# Patient Record
Sex: Female | Born: 1960 | Race: White | Hispanic: No | Marital: Married | Smoking: Never smoker
Health system: Southern US, Community
[De-identification: ages and names within clinical notes are randomized; demographics above are authoritative.]

## PROBLEM LIST (undated history)

## (undated) DIAGNOSIS — C801 Malignant (primary) neoplasm, unspecified: Secondary | ICD-10-CM

## (undated) DIAGNOSIS — R011 Cardiac murmur, unspecified: Secondary | ICD-10-CM

## (undated) DIAGNOSIS — E785 Hyperlipidemia, unspecified: Secondary | ICD-10-CM

## (undated) DIAGNOSIS — H269 Unspecified cataract: Secondary | ICD-10-CM

## (undated) DIAGNOSIS — F419 Anxiety disorder, unspecified: Secondary | ICD-10-CM

## (undated) DIAGNOSIS — M25473 Effusion, unspecified ankle: Secondary | ICD-10-CM

## (undated) DIAGNOSIS — I4891 Unspecified atrial fibrillation: Secondary | ICD-10-CM

## (undated) DIAGNOSIS — G4730 Sleep apnea, unspecified: Secondary | ICD-10-CM

## (undated) DIAGNOSIS — I10 Essential (primary) hypertension: Secondary | ICD-10-CM

## (undated) HISTORY — DX: Sleep apnea, unspecified: G47.30

## (undated) HISTORY — DX: Essential (primary) hypertension: I10

## (undated) HISTORY — DX: Unspecified cataract: H26.9

## (undated) HISTORY — DX: Hyperlipidemia, unspecified: E78.5

## (undated) HISTORY — DX: Unspecified atrial fibrillation: I48.91

## (undated) HISTORY — DX: Anxiety disorder, unspecified: F41.9

---

## 2006-05-11 ENCOUNTER — Ambulatory Visit: Payer: Self-pay | Admitting: Family Medicine

## 2006-07-06 ENCOUNTER — Ambulatory Visit: Payer: Self-pay | Admitting: Family Medicine

## 2006-07-27 ENCOUNTER — Encounter: Admission: RE | Admit: 2006-07-27 | Discharge: 2006-10-25 | Payer: Self-pay | Admitting: Family Medicine

## 2006-08-03 ENCOUNTER — Ambulatory Visit: Payer: Self-pay | Admitting: Family Medicine

## 2006-08-03 LAB — CONVERTED CEMR LAB
CO2: 29 meq/L (ref 19–32)
Creatinine, Ser: 0.7 mg/dL (ref 0.4–1.2)
Glucose, Bld: 128 mg/dL — ABNORMAL HIGH (ref 70–99)
Potassium: 3.8 meq/L (ref 3.5–5.1)
Sodium: 137 meq/L (ref 135–145)

## 2006-11-06 DIAGNOSIS — F32A Depression, unspecified: Secondary | ICD-10-CM | POA: Insufficient documentation

## 2006-11-06 DIAGNOSIS — I10 Essential (primary) hypertension: Secondary | ICD-10-CM | POA: Insufficient documentation

## 2006-11-06 DIAGNOSIS — F419 Anxiety disorder, unspecified: Secondary | ICD-10-CM

## 2006-12-07 ENCOUNTER — Ambulatory Visit: Payer: Self-pay | Admitting: Family Medicine

## 2006-12-07 LAB — CONVERTED CEMR LAB
BUN: 15 mg/dL (ref 6–23)
Calcium: 9.3 mg/dL (ref 8.4–10.5)
Chloride: 104 meq/L (ref 96–112)
Cholesterol: 190 mg/dL (ref 0–200)
GFR calc Af Amer: 116 mL/min
GFR calc non Af Amer: 96 mL/min
LDL Cholesterol: 121 mg/dL — ABNORMAL HIGH (ref 0–99)
Total CHOL/HDL Ratio: 4.6
VLDL: 28 mg/dL (ref 0–40)

## 2007-02-27 ENCOUNTER — Ambulatory Visit: Payer: Self-pay | Admitting: Family Medicine

## 2007-02-27 ENCOUNTER — Encounter (INDEPENDENT_AMBULATORY_CARE_PROVIDER_SITE_OTHER): Payer: Self-pay | Admitting: Family Medicine

## 2007-02-27 ENCOUNTER — Encounter: Payer: Self-pay | Admitting: Family Medicine

## 2007-06-03 ENCOUNTER — Ambulatory Visit: Payer: Self-pay | Admitting: Family Medicine

## 2007-06-04 ENCOUNTER — Telehealth (INDEPENDENT_AMBULATORY_CARE_PROVIDER_SITE_OTHER): Payer: Self-pay | Admitting: *Deleted

## 2007-06-04 LAB — CONVERTED CEMR LAB
BUN: 10 mg/dL (ref 6–23)
Calcium: 9.4 mg/dL (ref 8.4–10.5)
Chloride: 104 meq/L (ref 96–112)
Creatinine, Ser: 0.7 mg/dL (ref 0.4–1.2)
GFR calc non Af Amer: 96 mL/min

## 2007-07-31 ENCOUNTER — Telehealth (INDEPENDENT_AMBULATORY_CARE_PROVIDER_SITE_OTHER): Payer: Self-pay | Admitting: *Deleted

## 2007-11-07 ENCOUNTER — Ambulatory Visit: Payer: Self-pay | Admitting: Family Medicine

## 2007-11-07 DIAGNOSIS — N3941 Urge incontinence: Secondary | ICD-10-CM

## 2007-11-07 LAB — CONVERTED CEMR LAB
CO2: 28 meq/L (ref 19–32)
Calcium: 9.5 mg/dL (ref 8.4–10.5)
Cholesterol: 192 mg/dL (ref 0–200)
GFR calc Af Amer: 115 mL/min
GFR calc non Af Amer: 95 mL/min
Glucose, Bld: 91 mg/dL (ref 70–99)
HDL: 40.3 mg/dL (ref >39.0)
LDL Cholesterol: 136 mg/dL — ABNORMAL HIGH (ref 0–99)
Sodium: 138 meq/L (ref 135–145)
Total CHOL/HDL Ratio: 4.8

## 2007-11-08 ENCOUNTER — Encounter (INDEPENDENT_AMBULATORY_CARE_PROVIDER_SITE_OTHER): Payer: Self-pay | Admitting: *Deleted

## 2008-01-27 ENCOUNTER — Encounter: Admission: RE | Admit: 2008-01-27 | Discharge: 2008-01-27 | Payer: Self-pay | Admitting: Family Medicine

## 2008-01-29 ENCOUNTER — Telehealth (INDEPENDENT_AMBULATORY_CARE_PROVIDER_SITE_OTHER): Payer: Self-pay | Admitting: *Deleted

## 2009-11-29 ENCOUNTER — Encounter (INDEPENDENT_AMBULATORY_CARE_PROVIDER_SITE_OTHER): Payer: Self-pay | Admitting: *Deleted

## 2009-12-03 ENCOUNTER — Encounter (INDEPENDENT_AMBULATORY_CARE_PROVIDER_SITE_OTHER): Payer: Self-pay | Admitting: *Deleted

## 2009-12-16 ENCOUNTER — Ambulatory Visit: Payer: Self-pay | Admitting: Family Medicine

## 2009-12-16 DIAGNOSIS — E1169 Type 2 diabetes mellitus with other specified complication: Secondary | ICD-10-CM | POA: Insufficient documentation

## 2009-12-16 DIAGNOSIS — E785 Hyperlipidemia, unspecified: Secondary | ICD-10-CM | POA: Insufficient documentation

## 2009-12-17 LAB — CONVERTED CEMR LAB
AST: 17 units/L (ref 0–37)
Alkaline Phosphatase: 62 units/L (ref 39–117)
Bilirubin, Direct: 0 mg/dL (ref 0.0–0.3)
CO2: 30 meq/L (ref 19–32)
Calcium: 9.1 mg/dL (ref 8.4–10.5)
GFR calc non Af Amer: 94.33 mL/min (ref >60)
Glucose, Bld: 86 mg/dL (ref 70–99)
HDL: 56.1 mg/dL (ref >39.00)
Potassium: 4.1 meq/L (ref 3.5–5.1)
Sodium: 139 meq/L (ref 135–145)
Total CHOL/HDL Ratio: 3
VLDL: 21.4 mg/dL (ref 0.0–40.0)

## 2009-12-28 ENCOUNTER — Encounter: Payer: Self-pay | Admitting: Family Medicine

## 2010-01-21 ENCOUNTER — Telehealth (INDEPENDENT_AMBULATORY_CARE_PROVIDER_SITE_OTHER): Payer: Self-pay | Admitting: *Deleted

## 2010-01-24 ENCOUNTER — Telehealth: Payer: Self-pay | Admitting: Family Medicine

## 2010-03-28 ENCOUNTER — Telehealth: Payer: Self-pay | Admitting: Family Medicine

## 2010-03-31 ENCOUNTER — Telehealth: Payer: Self-pay | Admitting: Family Medicine

## 2010-05-25 ENCOUNTER — Telehealth: Payer: Self-pay | Admitting: Family Medicine

## 2010-07-04 ENCOUNTER — Ambulatory Visit: Payer: Self-pay | Admitting: Family Medicine

## 2010-07-05 LAB — CONVERTED CEMR LAB
ALT: 22 units/L (ref 0–35)
AST: 23 units/L (ref 0–37)
Albumin: 4.2 g/dL (ref 3.5–5.2)
Alkaline Phosphatase: 53 units/L (ref 39–117)
Basophils Absolute: 0.1 10*3/uL (ref 0.0–0.1)
Basophils Relative: 1.6 % (ref 0.0–3.0)
Calcium: 9.1 mg/dL (ref 8.4–10.5)
Eosinophils Relative: 2.4 % (ref 0.0–5.0)
GFR calc non Af Amer: 108.27 mL/min (ref >60)
HCT: 36.7 % (ref 36.0–46.0)
HDL: 48.3 mg/dL (ref >39.00)
Hemoglobin: 12.4 g/dL (ref 12.0–15.0)
LDL Cholesterol: 88 mg/dL (ref 0–99)
Lymphocytes Relative: 27.5 % (ref 12.0–46.0)
Monocytes Relative: 7 % (ref 3.0–12.0)
Neutro Abs: 4.7 10*3/uL (ref 1.4–7.7)
Potassium: 3.8 meq/L (ref 3.5–5.1)
RBC: 3.85 M/uL — ABNORMAL LOW (ref 3.87–5.11)
RDW: 13.4 % (ref 11.5–14.6)
Sodium: 139 meq/L (ref 135–145)
Total CHOL/HDL Ratio: 3
VLDL: 15 mg/dL (ref 0.0–40.0)
WBC: 7.7 10*3/uL (ref 4.5–10.5)

## 2010-07-20 ENCOUNTER — Encounter: Payer: Self-pay | Admitting: Family Medicine

## 2010-07-20 ENCOUNTER — Encounter
Admission: RE | Admit: 2010-07-20 | Discharge: 2010-09-14 | Payer: Self-pay | Source: Home / Self Care | Attending: Family Medicine | Admitting: Family Medicine

## 2010-08-22 ENCOUNTER — Telehealth: Payer: Self-pay | Admitting: Family Medicine

## 2010-09-26 ENCOUNTER — Telehealth: Payer: Self-pay | Admitting: Family Medicine

## 2010-11-02 ENCOUNTER — Telehealth: Payer: Self-pay | Admitting: Family Medicine

## 2010-11-06 ENCOUNTER — Encounter: Payer: Self-pay | Admitting: Family Medicine

## 2010-11-15 ENCOUNTER — Encounter: Admit: 2010-11-15 | Payer: Self-pay | Admitting: Family Medicine

## 2010-11-15 NOTE — Progress Notes (Signed)
Summary: refill   Phone Note Refill Request Message from:  Fax from Pharmacy on March 28, 2010 9:51 AM  Refills Requested: Medication #1:  DETROL LA 2 MG CP24 take one capsule daily [facility] [address] [facility] [place] fax - fax [phone] -- ph [phone]   Method Requested: Fax to Local Pharmacy Next Appointment Scheduled: none Initial call taken by: Okey Regal Spring,  March 28, 2010 9:52 AM  Follow-up for Phone Call        According to our records this has not been filled since 2009, okay to fill. Army Fossa CMA  March 28, 2010 10:20 AM   Additional Follow-up for Phone Call Additional follow up Details #1::        make sure pt is taking it---then ok to refill for 1 year Additional Follow-up by: Loreen Freud DO,  March 28, 2010 12:42 PM    Additional Follow-up for Phone Call Additional follow up Details #2::    left message for pt to call back. Army Fossa CMA  March 28, 2010 1:23 PM   Additional Follow-up for Phone Call Additional follow up Details #3:: Details for Additional Follow-up Action Taken: Pt states that she is not taking this medicaiton. Army Fossa CMA  March 28, 2010 1:30 PM

## 2010-11-15 NOTE — Progress Notes (Signed)
Summary: refill  Phone Note Refill Request Message from:  Fax from Pharmacy on May 25, 2010 2:50 PM  Refills Requested: Medication #1:  LORAZEPAM 0.5 MG TABS Take one tablet tid [employer]- fax [phone]  Initial call taken by: Okey Regal Spring,  May 25, 2010 2:50 PM  Follow-up for Phone Call        last filled 12-16-09, last Ov 04-15-10 #90.............Marland KitchenFelecia Deloach CMA  May 26, 2010 10:38 AM   Additional Follow-up for Phone Call Additional follow up Details #1::        ok for #90, no refills Additional Follow-up by: Neena Rhymes MD,  May 26, 2010 10:45 AM    Prescriptions: LORAZEPAM 0.5 MG TABS (LORAZEPAM) Take one tablet tid  #90 x 0   Entered by:   Jeremy Johann CMA   Authorized by:   Neena Rhymes MD   Signed by:   Jeremy Johann CMA on 05/26/2010   Method used:   Printed then faxed to ...       [facility] [id number]* (retail)       [address]       [place], [place]  [address]       Ph: [phone]       Fax: [phone]   RxID:   [id number]

## 2010-11-15 NOTE — Letter (Signed)
Summary: New Patient Letter  [facility]  [address] [place], [place] [address]   Phone: [phone]  Fax: [phone]       12/03/2009 MRN: [id number]  [facility] [address] [place], [place]  [address]  Dear Ms. Pam Phillips,   Welcome to [facility] and thank you for choosing [place] as your Primary Care Providers. Enclosed you will find information about our practice that we hope you find helpful. We have also enclosed forms to be filled out prior to your visit. This will provide [place] with the necessary information and facilitate your being seen in a timely manner. If you have any questions, please call us at: [phone] and we will be happy to assist you. We look forward to seeing you at your scheduled appointment time.  Appointment Thursday, December 16, 2009 at 10:00am  with Dr. Loreen Freud   Sincerely,  Primary Health Care Team  Please arrive 15 minutes early for your first appointment and bring your insurance card. Co-pay is required at the time of your visit.  *****Please call the office if you are not able to keep this appointment. There is a charge of $50.00 if any appointment is not cancelled or rescheduled within 24 hours*****

## 2010-11-15 NOTE — Assessment & Plan Note (Signed)
Summary: 6 mth fu/kdc   Vital Signs:  Patient profile:   50 year old female Weight:      362.0 pounds BMI:     54.44 Temp:     98.7 degrees F oral Pulse rate:   72 / minute Pulse rhythm:   regular BP sitting:   130 / 80  (right arm) Cuff size:   large  Vitals Entered By: Almeta Monas CMA Duncan Dull) (July 04, 2010 9:40 AM) CC: 6 mo f/u, wants a referral to a nutritionist   History of Present Illness: Pt here for f/u and labs.   Hyperlipidemia follow-up      This is a 50 year old woman who presents for Hyperlipidemia follow-up.  The patient denies muscle aches, GI upset, abdominal pain, flushing, itching, constipation, diarrhea, and fatigue.  The patient denies the following symptoms: chest pain/pressure, exercise intolerance, dypsnea, palpitations, syncope.    Compliance with medications (by patient report) has been near 100%.  Dietary compliance has been poor.  The patient reports exercising 3-4X per week.    Hypertension follow-up      The patient also presents for Hypertension follow-up.  The patient denies lightheadedness, urinary frequency, headaches, edema, impotence, rash, and fatigue.  Associated symptoms include leg edema.  The patient denies the following associated symptoms: chest pain, chest pressure, exercise intolerance, dyspnea, palpitations, syncope, and pedal edema.  Compliance with medications (by patient report) has been near 100%.  The patient reports that dietary compliance has been poor.  The patient reports exercising 3-4X per week.  Adjunctive measures currently used by the patient include salt restriction.    Preventive Screening-Counseling & Management  Alcohol-Tobacco     Smoking Status: never      Drug Use:  no.    Current Medications (verified): 1)  Diovan Hct 320-25 Mg Tabs (Valsartan-Hydrochlorothiazide) .... Take 1 By Mouth Daily. 2)  Lorazepam 0.5 Mg Tabs (Lorazepam) .... Take One Tablet Tid 3)  Detrol La 2 Mg Cp24 (Tolterodine Tartrate) ....  Take One Capsule Daily 4)  Lovastatin 40 Mg Tabs (Lovastatin) .... Take 1 By Mouth At Bedtime. 5)  Aspirin 81 Mg Chew (Aspirin) .Marland Kitchen.. 1 By Mouth Qd  Allergies (verified): 1)  ! Penicillin 2)  ! Codeine  Past History:  Past medical, surgical, family and social histories (including risk factors) reviewed for relevance to current acute and chronic problems.  Past Medical History: Reviewed history from 12/16/2009 and no changes required. Anxiety Hypertension Hyperlipidemia  Family History: Reviewed history and no changes required. Family History of CAD Female 1st degree relative 51yo Family History Hypertension  Social History: Reviewed history and no changes required. Occupation: Neurosurgeon for [employer] Married Never Smoked Alcohol use-no Drug use-no Occupation:  employed Drug Use:  no  Review of Systems      See HPI  Physical Exam  General:  Well-developed,well-nourished,in no acute distress; alert,appropriate and cooperative throughout examination Neck:  No deformities, masses, or tenderness noted. Lungs:  Normal respiratory effort, chest expands symmetrically. Lungs are clear to auscultation, no crackles or wheezes. Heart:  normal rate and Grade 2  /6 systolic ejection murmur.   Extremities:  1+ left pedal edema, left pretibial edema, 1+ right pedal edema, and right pretibial edema.   Psych:  Oriented X3 and normally interactive.     Impression & Recommendations:  Problem # 1:  MORBID OBESITY (ICD-278.01)  Orders: Nutrition Referral (Nutrition) Venipuncture ([address]) TLB-Lipid Panel (80061-LIPID) TLB-BMP (Basic Metabolic Panel-BMET) (80048-METABOL) TLB-CBC Platelet - w/Differential (85025-CBCD)  TLB-Hepatic/Liver Function Pnl (80076-HEPATIC) TLB-TSH (Thyroid Stimulating Hormone) (84443-TSH) Specimen Handling ([address])  Ht: 68.5 (12/16/2009)   Wt: 362.0 (07/04/2010)   BMI: 54.44 (07/04/2010)  Problem # 2:  HYPERLIPIDEMIA (ICD-272.4)  Her updated  medication list for this problem includes:    Lovastatin 40 Mg Tabs (Lovastatin) .Marland Kitchen... Take 1 by mouth at bedtime.  Orders: Nutrition Referral (Nutrition) Venipuncture ([address]) TLB-Lipid Panel (80061-LIPID) TLB-BMP (Basic Metabolic Panel-BMET) (80048-METABOL) TLB-CBC Platelet - w/Differential (85025-CBCD) TLB-Hepatic/Liver Function Pnl (80076-HEPATIC) TLB-TSH (Thyroid Stimulating Hormone) (84443-TSH) Specimen Handling ([address])  Labs Reviewed: SGOT: 17 (12/16/2009)   SGPT: 21 (12/16/2009)   HDL:56.10 (12/16/2009), 40.3 (11/07/2007)  LDL:72 (12/16/2009), 136 (91/47/8295)  Chol:149 (12/16/2009), 192 (11/07/2007)  Trig:107.0 (12/16/2009), 79 (11/07/2007)  Problem # 3:  HYPERLIPIDEMIA (ICD-272.4)  Her updated medication list for this problem includes:    Lovastatin 40 Mg Tabs (Lovastatin) .Marland Kitchen... Take 1 by mouth at bedtime.  Orders: Nutrition Referral (Nutrition) Venipuncture ([address]) TLB-Lipid Panel (80061-LIPID) TLB-BMP (Basic Metabolic Panel-BMET) (80048-METABOL) TLB-CBC Platelet - w/Differential (85025-CBCD) TLB-Hepatic/Liver Function Pnl (80076-HEPATIC) TLB-TSH (Thyroid Stimulating Hormone) (84443-TSH) Specimen Handling ([address])  Labs Reviewed: SGOT: 17 (12/16/2009)   SGPT: 21 (12/16/2009)   HDL:56.10 (12/16/2009), 40.3 (11/07/2007)  LDL:72 (12/16/2009), 136 (46/96/2952)  Chol:149 (12/16/2009), 192 (11/07/2007)  Trig:107.0 (12/16/2009), 79 (11/07/2007)  Complete Medication List: 1)  Diovan Hct 320-25 Mg Tabs (Valsartan-hydrochlorothiazide) .... Take 1 by mouth daily. 2)  Lorazepam 0.5 Mg Tabs (Lorazepam) .... Take one tablet tid 3)  Detrol La 2 Mg Cp24 (Tolterodine tartrate) .... Take one capsule daily 4)  Lovastatin 40 Mg Tabs (Lovastatin) .... Take 1 by mouth at bedtime. 5)  Aspirin 81 Mg Chew (Aspirin) .Marland Kitchen.. 1 by mouth qd  Patient Instructions: 1)  Please schedule a follow-up appointment in 6 months .  Prescriptions: LORAZEPAM 0.5 MG TABS (LORAZEPAM) Take one tablet tid  #90  x 0   Entered and Authorized by:   Loreen Freud DO   Signed by:   Loreen Freud DO on 07/04/2010   Method used:   Print then Give to Patient   RxID:   [id number] LOVASTATIN 40 MG TABS (LOVASTATIN) take 1 by mouth at bedtime.  #90 x 3   Entered and Authorized by:   Loreen Freud DO   Signed by:   Loreen Freud DO on 07/04/2010   Method used:   Electronically to        [facility]* (retail)       [address]       [place], [place]  [address]       Ph: [phone]       Fax: [phone]   RxID:   [id number] DIOVAN HCT 320-25 MG TABS (VALSARTAN-HYDROCHLOROTHIAZIDE) take 1 by mouth daily.  #90 x 3   Entered and Authorized by:   Loreen Freud DO   Signed by:   Loreen Freud DO on 07/04/2010   Method used:   Electronically to        [facility]* (retail)       [address]       [place], [place]  [address]       Ph: [phone]       Fax: [phone]   RxID:   [id number]

## 2010-11-15 NOTE — Letter (Signed)
Summary: Records Dated 04-14-06 thru 04-30-09/[facility]  Records Dated 04-14-06 thru 04-30-09/[facility]   Imported By: Lanelle Bal 01/26/2010 11:12:05  _____________________________________________________________________  External Attachment:    Type:   Image     Comment:   External Document

## 2010-11-15 NOTE — Consult Note (Signed)
Summary: [facility]  [facility]   Imported By: Lanelle Bal 07/28/2010 14:22:38  _____________________________________________________________________  External Attachment:    Type:   Image     Comment:   External Document

## 2010-11-15 NOTE — Progress Notes (Signed)
Summary: refill  Phone Note Refill Request Message from:  Fax from Pharmacy on January 24, 2010 9:25 AM  Refills Requested: Medication #1:  LORAZEPAM 0.5 MG TABS Take one tablet tid [facility] [address] fax [phone]   Method Requested: Fax to Local Pharmacy Next Appointment Scheduled: 06/27/2010 Initial call taken by: Barb Merino,  January 24, 2010 9:26 AM  Follow-up for Phone Call        last filled, last ov- 12/16/09. Army Fossa CMA  January 24, 2010 9:59 AM   Additional Follow-up for Phone Call Additional follow up Details #1::        ok to refill Additional Follow-up by: Loreen Freud DO,  January 24, 2010 10:31 AM    Prescriptions: LORAZEPAM 0.5 MG TABS (LORAZEPAM) Take one tablet tid  #90 x 0   Entered by:   Army Fossa CMA   Authorized by:   Loreen Freud DO   Signed by:   Army Fossa CMA on 01/24/2010   Method used:   Printed then faxed to ...       [facility] [id number] (retail)       [address] [place], [place]  [address]       Ph: [phone] or [phone]       Fax: [phone]   RxID:   [id number]

## 2010-11-15 NOTE — Progress Notes (Signed)
Summary: refill  Phone Note Refill Request Message from:  Fax from Pharmacy on August 22, 2010 1:28 PM  Refills Requested: Medication #1:  LORAZEPAM 0.5 MG TABS Take one tablet tid [facility] - fax [phone]  Initial call taken by: Okey Regal Spring,  August 22, 2010 1:28 PM  Follow-up for Phone Call        Last seen and filled 07/04/10.Marland Kitchen Please advise Follow-up by: Almeta Monas CMA Duncan Dull),  August 22, 2010 2:27 PM  Additional Follow-up for Phone Call Additional follow up Details #1::        refill x1 Additional Follow-up by: Loreen Freud DO,  August 22, 2010 2:38 PM    Prescriptions: LORAZEPAM 0.5 MG TABS (LORAZEPAM) Take one tablet tid  #90 x 0   Entered by:   Almeta Monas CMA (AAMA)   Authorized by:   Loreen Freud DO   Signed by:   Almeta Monas CMA (AAMA) on 08/22/2010   Method used:   Printed then faxed to ...       [facility] [id number]* (retail)       [address]       [place], [place]  [address]       Ph: [phone]       Fax: [phone]   RxID:   [id number]  Faxed to [facility] @ [phone].... Almeta Monas CMA Duncan Dull)  August 22, 2010 3:07 PM

## 2010-11-15 NOTE — Progress Notes (Signed)
Summary: Refill Request  Phone Note Refill Request Message from:  Pharmacy on [facility] [address] Fax #: [phone]  Refills Requested: Medication #1:  LOVASTATIN 40 MG TABS take 1 by mouth at bedtime..   Dosage confirmed as above?Dosage Confirmed   Supply Requested: 1 month   Last Refilled: 12/21/2009 Next Appointment Scheduled: 9.12.11 Initial call taken by: Harold Barban,  January 21, 2010 8:47 AM    Prescriptions: LOVASTATIN 40 MG TABS (LOVASTATIN) take 1 by mouth at bedtime.  #30 x 5   Entered by:   Army Fossa CMA   Authorized by:   Loreen Freud DO   Signed by:   Army Fossa CMA on 01/21/2010   Method used:   Printed then faxed to ...       [id number]* (retail)       [address]       [place], [place]  [address]       Ph: [phone]       Fax: [phone]   RxID:   [id number]

## 2010-11-15 NOTE — Letter (Signed)
Summary: New Patient Letter  [facility]  [address] [place], [place] [address]   Phone: [phone]  Fax: [phone]       11/29/2009 MRN: [id number]  [facility] [address] [place], [place]  [address]  Dear Ms. Pam Phillips,   Welcome to [facility] and thank you for choosing [place] as your Primary Care Providers. Enclosed you will find information about our practice that we hope you find helpful. We have also enclosed forms to be filled out prior to your visit. This will provide [place] with the necessary information and facilitate your being seen in a timely manner. If you have any questions, please call us at:  [phone]        and we will be happy to assist you. We look forward to seeing you at your scheduled appointment time.  Appointment   MARCH [phone] @ 10:00AM            with Dr.  Laury Axon               Sincerely,  Primary Health Care Team  Please arrive 15 minutes early for your first appointment and bring your insurance card. Co-pay is required at the time of your visit.  *****Please call the office if you are not able to keep this appointment. There is a charge of $50.00 if any appointment is not cancelled or rescheduled within 24 hours.

## 2010-11-15 NOTE — Progress Notes (Signed)
Summary: refill  Phone Note Refill Request Call back at Work Phone [phone] Call back at [phone]   Refills Requested: Medication #1:  DETROL LA 2 MG CP24 take one capsule daily patient called re refill- there was some confusion at the pharmacy - patient & mother in law live together - they received a call at the home but spoke to mother in law & SHE doesnt take this med - please refill [facility] [address] [facility] [place]      Method Requested: Fax to Local Pharmacy Initial call taken by: Okey Regal Spring,  March 31, 2010 8:47 AM Caller: Patient Summary of Call: patient has been taking this med - she live  Follow-up for Phone Call        ok to refill x1 Follow-up by: Loreen Freud DO,  March 31, 2010 8:55 AM    Prescriptions: DETROL LA 2 MG CP24 (TOLTERODINE TARTRATE) take one capsule daily  #30 x 11   Entered by:   Army Fossa CMA   Authorized by:   Loreen Freud DO   Signed by:   Army Fossa CMA on 03/31/2010   Method used:   Faxed to ...       [id number] (retail)       [address]       [place], [place]  [address]       Ph: [phone]       Fax: [phone]   RxID:   [id number] DETROL LA 2 MG CP24 (TOLTERODINE TARTRATE) take one capsule daily  #30 x 11   Entered and Authorized by:   Loreen Freud DO   Signed by:   Loreen Freud DO on 03/31/2010   Method used:   Electronically to        [id number] (retail)       [address]       [place], [place]  [address]       Ph: [phone]       Fax: [phone]   RxID:   [id number]  [facility]. Army Fossa CMA  March 31, 2010 9:02 AM

## 2010-11-15 NOTE — Assessment & Plan Note (Signed)
Summary: new pt/bcbs/ns/kdc   Vital Signs:  Patient profile:   50 year old female Height:      68.5 inches Weight:      366 pounds BMI:     55.04 Temp:     98.4 degrees F oral Pulse rate:   80 / minute Pulse rhythm:   regular BP sitting:   126 / 80  (left arm) Cuff size:   large  Vitals Entered By: Army Fossa CMA (December 16, 2009 10:12 AM) CC: To establish, refills on meds, bloodwork (does not need CPX)   History of Present Illness:  Hypertension follow-up      This is a 50 year old woman who presents for Hypertension follow-up.  The patient denies lightheadedness, urinary frequency, headaches, edema, impotence, rash, and fatigue.  The patient denies the following associated symptoms: chest pain, chest pressure, exercise intolerance, dyspnea, palpitations, syncope, leg edema, and pedal edema.  Compliance with medications (by patient report) has been near 100%.  The patient reports that dietary compliance has been fair.  The patient reports exercising occasionally.  Adjunctive measures currently used by the patient include salt restriction.    Hyperlipidemia follow-up      The patient also presents for Hyperlipidemia follow-up.  The patient denies muscle aches, GI upset, abdominal pain, flushing, itching, constipation, diarrhea, and fatigue.  The patient denies the following symptoms: chest pain/pressure, exercise intolerance, dypsnea, palpitations, syncope, and pedal edema.  Compliance with medications (by patient report) has been near 100%.  Dietary compliance has been good.  The patient reports exercising occasionally.  Adjunctive measures currently used by the patient include fiber, ASA, fish oil supplements, and weight reduction.    Preventive Screening-Counseling & Management  Alcohol-Tobacco     Alcohol drinks/day: <1     Alcohol type: wine     Smoking Status: never  Caffeine-Diet-Exercise     Caffeine use/day: 4     Diet Comments: pt in diet and exercise program  Does Patient Exercise: yes      Sexual History:  currently monogamous.    Current Medications (verified): 1)  Diovan Hct 320-25 Mg Tabs (Valsartan-Hydrochlorothiazide) .... Take 1 By Mouth Daily. 2)  Lorazepam 0.5 Mg Tabs (Lorazepam) .... Take One Tablet Tid 3)  Detrol La 2 Mg Cp24 (Tolterodine Tartrate) .... Take One Capsule Daily 4)  Lovastatin 40 Mg Tabs (Lovastatin) .... Take 1 By Mouth At Bedtime.  Allergies: 1)  ! Penicillin 2)  ! Codeine  Past History:  Past medical, surgical, family and social histories (including risk factors) reviewed for relevance to current acute and chronic problems.  Past Medical History: Anxiety Hypertension Hyperlipidemia  Family History: Reviewed history and no changes required.  Social History: Reviewed history and no changes required. Smoking Status:  never Caffeine use/day:  4 Does Patient Exercise:  yes Sexual History:  currently monogamous  Review of Systems      See HPI  Physical Exam  General:  Well-developed,well-nourished,in no acute distress; alert,appropriate and cooperative throughout examination Neck:  No deformities, masses, or tenderness noted. Lungs:  Normal respiratory effort, chest expands symmetrically. Lungs are clear to auscultation, no crackles or wheezes. Heart:  normal rate and Grade  2 /6 systolic ejection murmur.   Extremities:  trace left pedal edema and trace right pedal edema.   Psych:  Cognition and judgment appear intact. Alert and cooperative with normal attention span and concentration. No apparent delusions, illusions, hallucinations   Impression & Recommendations:  Problem # 1:  HYPERLIPIDEMIA (ICD-272.4)  Her updated medication list for this problem includes:    Lovastatin 40 Mg Tabs (Lovastatin) .Marland Kitchen... Take 1 by mouth at bedtime.  Orders: Venipuncture ([address]) TLB-Lipid Panel (80061-LIPID) TLB-BMP (Basic Metabolic Panel-BMET) (80048-METABOL) TLB-Hepatic/Liver Function Pnl  (80076-HEPATIC)  Problem # 2:  HYPERTENSION (ICD-401.9)  Her updated medication list for this problem includes:    Diovan Hct 320-25 Mg Tabs (Valsartan-hydrochlorothiazide) .Marland Kitchen... Take 1 by mouth daily.  Orders: Venipuncture ([address]) TLB-Lipid Panel (80061-LIPID) TLB-BMP (Basic Metabolic Panel-BMET) (80048-METABOL) TLB-Hepatic/Liver Function Pnl (80076-HEPATIC)  Problem # 3:  ANXIETY (ICD-300.00)  Her updated medication list for this problem includes:    Lorazepam 0.5 Mg Tabs (Lorazepam) .Marland Kitchen... Take one tablet tid  Problem # 4:  INCONTINENCE, URGE (ICD-788.31)  Complete Medication List: 1)  Diovan Hct 320-25 Mg Tabs (Valsartan-hydrochlorothiazide) .... Take 1 by mouth daily. 2)  Lorazepam 0.5 Mg Tabs (Lorazepam) .... Take one tablet tid 3)  Detrol La 2 Mg Cp24 (Tolterodine tartrate) .... Take one capsule daily 4)  Lovastatin 40 Mg Tabs (Lovastatin) .... Take 1 by mouth at bedtime.                                                                 Prescriptions: DIOVAN HCT 320-25 MG TABS (VALSARTAN-HYDROCHLOROTHIAZIDE) take 1 by mouth daily.  #30 x 2   Entered and Authorized by:   Loreen Freud DO   Signed by:   Loreen Freud DO on 12/16/2009   Method used:   Print then Give to Patient   RxID:   [id number] LORAZEPAM 0.5 MG TABS (LORAZEPAM) Take one tablet tid  #90 x 0   Entered and Authorized by:   Loreen Freud DO   Signed by:   Loreen Freud DO on 12/16/2009   Method used:   Print then Give to Patient   RxID:   [id number]

## 2010-11-17 NOTE — Progress Notes (Signed)
Summary: Lorazepam refill  Phone Note Refill Request Message from:  Fax from Pharmacy on November 02, 2010 10:39 AM  Refills Requested: Medication #1:  LORAZEPAM 0.5 MG TABS Take one tablet tid [facility], [address] [place], [place]  phone - [phone]    fax - [phone]    qty - 90  Next Appointment Scheduled: Mon 3/19   [place] Initial call taken by: Jerolyn Shin,  November 02, 2010 10:48 AM  Follow-up for Phone Call        last seen 07/04/10 and filled 09/27/10 please advise Follow-up by: Almeta Monas CMA Duncan Dull),  November 02, 2010 12:05 PM  Additional Follow-up for Phone Call Additional follow up Details #1::        refill x1  2 refills Additional Follow-up by: Loreen Freud DO,  November 02, 2010 12:18 PM    Prescriptions: LORAZEPAM 0.5 MG TABS (LORAZEPAM) Take one tablet tid  #90 x 2   Entered by:   Almeta Monas CMA (AAMA)   Authorized by:   Loreen Freud DO   Signed by:   Almeta Monas CMA (AAMA) on 11/02/2010   Method used:   Printed then faxed to ...       [facility] (retail)       [address]       [place], [place]  [address]       Ph: [phone]       Fax: [phone]   RxID:   [id number]

## 2010-11-17 NOTE — Progress Notes (Signed)
Summary: Lorazepam refill  Phone Note Refill Request Message from:  Fax from Pharmacy on September 26, 2010 4:50 PM  Refills Requested: Medication #1:  LORAZEPAM 0.5 MG TABS Take one tablet tid [facility].Marland KitchenMarland Kitchen[address], [place], [place]    phone  [phone],   fax   [phone]    qty = 90  Next Appointment Scheduled: mon 01/02/2011  Initial call taken by: Jerolyn Shin,  September 26, 2010 5:01 PM  Follow-up for Phone Call        l;ast OV 07-04-10,  last filled 08-22-10 #90.....Marland KitchenMarland KitchenFelecia Deloach CMA  September 27, 2010 10:30 AM   Additional Follow-up for Phone Call Additional follow up Details #1::        refill x1 Additional Follow-up by: Loreen Freud DO,  September 27, 2010 10:49 AM    Prescriptions: LORAZEPAM 0.5 MG TABS (LORAZEPAM) Take one tablet tid  #90 x 0   Entered by:   Almeta Monas CMA (AAMA)   Authorized by:   Loreen Freud DO   Signed by:   Almeta Monas CMA (AAMA) on 09/27/2010   Method used:   Printed then faxed to ...       [facility] [address] [id number]* (retail)       [address]       [place], [place]  [address]       Ph: [phone]       Fax: [phone]   RxID:   [id number]  Faxed to [facility] [address]... Almeta Monas CMA Duncan Dull)  September 27, 2010 11:23 AM

## 2010-12-15 ENCOUNTER — Other Ambulatory Visit: Payer: Self-pay | Admitting: Family Medicine

## 2010-12-15 DIAGNOSIS — Z1231 Encounter for screening mammogram for malignant neoplasm of breast: Secondary | ICD-10-CM

## 2010-12-20 ENCOUNTER — Encounter: Payer: Self-pay | Admitting: Family Medicine

## 2011-01-02 ENCOUNTER — Ambulatory Visit (INDEPENDENT_AMBULATORY_CARE_PROVIDER_SITE_OTHER): Payer: BC Managed Care – PPO | Admitting: Family Medicine

## 2011-01-02 ENCOUNTER — Other Ambulatory Visit: Payer: Self-pay | Admitting: Family Medicine

## 2011-01-02 ENCOUNTER — Encounter: Payer: Self-pay | Admitting: Family Medicine

## 2011-01-02 DIAGNOSIS — R0989 Other specified symptoms and signs involving the circulatory and respiratory systems: Secondary | ICD-10-CM | POA: Insufficient documentation

## 2011-01-02 DIAGNOSIS — R0609 Other forms of dyspnea: Secondary | ICD-10-CM

## 2011-01-02 DIAGNOSIS — I10 Essential (primary) hypertension: Secondary | ICD-10-CM

## 2011-01-02 DIAGNOSIS — R0683 Snoring: Secondary | ICD-10-CM | POA: Insufficient documentation

## 2011-01-02 DIAGNOSIS — E785 Hyperlipidemia, unspecified: Secondary | ICD-10-CM

## 2011-01-02 DIAGNOSIS — B354 Tinea corporis: Secondary | ICD-10-CM | POA: Insufficient documentation

## 2011-01-02 LAB — LIPID PANEL
Cholesterol: 166 mg/dL (ref 0–200)
HDL: 49.2 mg/dL (ref >39.00)
Triglycerides: 75 mg/dL (ref 0.0–149.0)

## 2011-01-02 LAB — BASIC METABOLIC PANEL
BUN: 15 mg/dL (ref 6–23)
CO2: 27 mEq/L (ref 19–32)
Calcium: 9.1 mg/dL (ref 8.4–10.5)
Creatinine, Ser: 0.7 mg/dL (ref 0.4–1.2)
Glucose, Bld: 105 mg/dL — ABNORMAL HIGH (ref 70–99)
Sodium: 137 mEq/L (ref 135–145)

## 2011-01-02 LAB — HEPATIC FUNCTION PANEL
Albumin: 4 g/dL (ref 3.5–5.2)
Alkaline Phosphatase: 59 U/L (ref 39–117)
Total Protein: 6.6 g/dL (ref 6.0–8.3)

## 2011-01-12 NOTE — Assessment & Plan Note (Signed)
Summary: 6 MONTH ROV/CBS   Vital Signs:  Patient profile:   50 year old female Weight:      365.2 pounds Pulse rate:   60 / minute Pulse rhythm:   regular BP sitting:   138 / 88  (right arm) Cuff size:   wrist  Vitals Entered By: Almeta Monas CMA Duncan Dull) (January 02, 2011 9:40 AM) CC: 6 mo f/u--fasting   History of Present Illness:  Hypertension follow-up      This is a 50 year old woman who presents for Hypertension follow-up.  The patient denies lightheadedness, urinary frequency, headaches, edema, impotence, rash, and fatigue.  The patient denies the following associated symptoms: chest pain, chest pressure, exercise intolerance, dyspnea, palpitations, syncope, leg edema, and pedal edema.  Compliance with medications (by patient report) has been sporadic.  The patient reports that dietary compliance has been good.  The patient reports exercising 3-4X per week.  Adjunctive measures currently used by the patient include salt restriction.    Hyperlipidemia follow-up      The patient also presents for Hyperlipidemia follow-up.  The patient denies muscle aches, GI upset, abdominal pain, flushing, itching, constipation, diarrhea, and fatigue.  The patient denies the following symptoms: chest pain/pressure, exercise intolerance, dypsnea, palpitations, syncope, and pedal edema.  Compliance with medications (by patient report) has been near 100%.  Dietary compliance has been good.  The patient reports exercising 3-4X per week.  Adjunctive measures currently used by the patient include ASA, fish oil supplements, limiting alcohol consumpton, and weight reduction.    Pt has had a lot more stess but since exercising she is doing much better.  She is going to [place] on Mondays and swimming .  Pt is also snoring per husband and she is tired in daytime.   Pt also c/o raw spot under R breast---- using zinc oxide,  gold bond.   Problems Prior to Update: 1)  Snoring  (ICD-786.09) 2)  Family History of  Cad Female 1st Degree Relative <50  (ICD-V17.3) 3)  Morbid Obesity  (ICD-278.01) 4)  Hyperlipidemia  (ICD-272.4) 5)  Incontinence, Urge  (ICD-788.31) 6)  Hypertension  (ICD-401.9) 7)  Anxiety  (ICD-300.00)  Medications Prior to Update: 1)  Diovan Hct 320-25 Mg Tabs (Valsartan-Hydrochlorothiazide) .... Take 1 By Mouth Daily. 2)  Lorazepam 0.5 Mg Tabs (Lorazepam) .... Take One Tablet Tid 3)  Detrol La 2 Mg Cp24 (Tolterodine Tartrate) .... Take One Capsule Daily 4)  Lovastatin 40 Mg Tabs (Lovastatin) .... Take 1 By Mouth At Bedtime. 5)  Aspirin 81 Mg Chew (Aspirin) .Marland Kitchen.. 1 By Mouth Qd  Current Medications (verified): 1)  Diovan Hct 320-25 Mg Tabs (Valsartan-Hydrochlorothiazide) .... Take 1 By Mouth Daily. 2)  Lorazepam 0.5 Mg Tabs (Lorazepam) .... Take One Tablet Tid 3)  Detrol La 2 Mg Cp24 (Tolterodine Tartrate) .... Take One Capsule Daily 4)  Lovastatin 40 Mg Tabs (Lovastatin) .... Take 1 By Mouth At Bedtime. 5)  Aspirin 81 Mg Chew (Aspirin) .Marland Kitchen.. 1 By Mouth Qd 6)  Fish Oil 1200 Mg Caps (Omega-3 Fatty Acids) .Marland Kitchen.. 1 By Mouth Two Times A Day 7)  Pedi-Dri 100000 Unit/gm Powd (Nystatin) .... Apply Three Times A Day  Allergies (verified): 1)  ! Penicillin 2)  ! Codeine  Past History:  Past medical, surgical, family and social histories (including risk factors) reviewed for relevance to current acute and chronic problems.  Past Medical History: Reviewed history from 12/16/2009 and no changes required. Anxiety Hypertension Hyperlipidemia  Family History: Reviewed history from  07/04/2010 and no changes required. Family History of CAD Female 1st degree relative 51yo Family History Hypertension  Social History: Reviewed history from 07/04/2010 and no changes required. Occupation: Neurosurgeon for [employer] Married Never Smoked Alcohol use-no Drug use-no  Review of Systems      See HPI  Physical Exam  General:  Well-developed,well-nourished,in no acute distress;  alert,appropriate and cooperative throughout examination Lungs:  Normal respiratory effort, chest expands symmetrically. Lungs are clear to auscultation, no crackles or wheezes. Heart:  normal rate and no murmur.   Extremities:  2+ left pedal edema, left pretibial edema, 1+ right pedal edema, and right pretibial edema.   Skin:  + circular errythema under R breast no other complaints.   Cervical Nodes:  No lymphadenopathy noted Psych:  Oriented X3 and normally interactive.     Impression & Recommendations:  Problem # 1:  HYPERLIPIDEMIA (ICD-272.4)  Her updated medication list for this problem includes:    Lovastatin 40 Mg Tabs (Lovastatin) .Marland Kitchen... Take 1 by mouth at bedtime.  Orders: Venipuncture (95621) TLB-Lipid Panel (80061-LIPID) TLB-BMP (Basic Metabolic Panel-BMET) (80048-METABOL) TLB-Hepatic/Liver Function Pnl (80076-HEPATIC) Specimen Handling (30865)  Labs Reviewed: SGOT: 23 (07/04/2010)   SGPT: 22 (07/04/2010)   HDL:48.30 (07/04/2010), 56.10 (12/16/2009)  LDL:88 (07/04/2010), 72 (12/16/2009)  Chol:151 (07/04/2010), 149 (12/16/2009)  Trig:75.0 (07/04/2010), 107.0 (12/16/2009)  Problem # 2:  HYPERTENSION (ICD-401.9)  Her updated medication list for this problem includes:    Diovan Hct 320-25 Mg Tabs (Valsartan-hydrochlorothiazide) .Marland Kitchen... Take 1 by mouth daily.  Orders: Venipuncture (78469) TLB-Lipid Panel (80061-LIPID) TLB-BMP (Basic Metabolic Panel-BMET) (80048-METABOL) TLB-Hepatic/Liver Function Pnl (80076-HEPATIC) Specimen Handling (62952)  BP today: 138/88 Prior BP: 130/80 (07/04/2010)  Labs Reviewed: K+: 3.8 (07/04/2010) Creat: : 0.6 (07/04/2010)   Chol: 151 (07/04/2010)   HDL: 48.30 (07/04/2010)   LDL: 88 (07/04/2010)   TG: 75.0 (07/04/2010)  Problem # 3:  MORBID OBESITY (ICD-278.01)  Ht: 68.5 (12/16/2009)   Wt: 365.2 (01/02/2011)   BMI: 54.44 (07/04/2010)  Problem # 4:  SNORING (ICD-786.09)  Her updated medication list for this problem includes:     Diovan Hct 320-25 Mg Tabs (Valsartan-hydrochlorothiazide) .Marland Kitchen... Take 1 by mouth daily.  Orders: Sleep Disorder Referral (Sleep Disorder)  Recommended fluid and salt restriction.   Problem # 5:  TINEA CORPORIS (ICD-110.5) nystatin powder  Complete Medication List: 1)  Diovan Hct 320-25 Mg Tabs (Valsartan-hydrochlorothiazide) .... Take 1 by mouth daily. 2)  Lorazepam 0.5 Mg Tabs (Lorazepam) .... Take one tablet tid 3)  Detrol La 2 Mg Cp24 (Tolterodine tartrate) .... Take one capsule daily 4)  Lovastatin 40 Mg Tabs (Lovastatin) .... Take 1 by mouth at bedtime. 5)  Aspirin 81 Mg Chew (Aspirin) .Marland Kitchen.. 1 by mouth qd 6)  Fish Oil 1200 Mg Caps (Omega-3 fatty acids) .Marland Kitchen.. 1 by mouth two times a day 7)  Pedi-dri 100000 Unit/gm Powd (Nystatin) .... Apply three times a day  Patient Instructions: 1)  Please schedule a follow-up appointment in 6 months .  Prescriptions: DIOVAN HCT 320-25 MG TABS (VALSARTAN-HYDROCHLOROTHIAZIDE) take 1 by mouth daily.  #90 x 3   Entered and Authorized by:   Loreen Freud DO   Signed by:   Loreen Freud DO on 01/02/2011   Method used:   Print then Give to Patient   RxID:   [id number] PEDI-DRI 100000 UNIT/GM POWD (NYSTATIN) apply three times a day  #1 bottle x 1   Entered and Authorized by:   Loreen Freud DO   Signed by:   Loreen Freud  DO on 01/02/2011   Method used:   Electronically to        [id number]* (retail)       [address]       [place], [place]  [address]       Ph: [phone]       Fax: [phone]   RxID:   [id number]    Orders Added: 1)  Sleep Disorder Referral [Sleep Disorder] 2)  Venipuncture [84132] 3)  TLB-Lipid Panel [80061-LIPID] 4)  TLB-BMP (Basic Metabolic Panel-BMET) [80048-METABOL] 5)  TLB-Hepatic/Liver Function Pnl [80076-HEPATIC] 6)  Est. Patient Level IV [44010] 7)  Specimen Handling [99000]

## 2011-01-23 ENCOUNTER — Ambulatory Visit
Admission: RE | Admit: 2011-01-23 | Discharge: 2011-01-23 | Disposition: A | Discharge status: Home / Self Care | Payer: BC Managed Care – PPO | Source: Ambulatory Visit | Attending: Family Medicine | Admitting: Family Medicine

## 2011-01-23 DIAGNOSIS — Z1231 Encounter for screening mammogram for malignant neoplasm of breast: Secondary | ICD-10-CM

## 2011-01-31 ENCOUNTER — Encounter: Payer: Self-pay | Admitting: Pulmonary Disease

## 2011-02-02 ENCOUNTER — Institutional Professional Consult (permissible substitution): Payer: BC Managed Care – PPO | Admitting: Pulmonary Disease

## 2011-02-16 ENCOUNTER — Telehealth: Payer: Self-pay | Admitting: Family Medicine

## 2011-02-16 MED ORDER — LORAZEPAM 0.5 MG PO TABS: 0.5000 mg | ORAL_TABLET | Freq: Three times a day (TID) | ORAL | Status: DC

## 2011-02-16 NOTE — Telephone Encounter (Signed)
Last seen 01/02/11 and filled 11/02/10 #90 with 2 refills     KP

## 2011-02-16 NOTE — Telephone Encounter (Signed)
Refill x1   2 refills 

## 2011-02-16 NOTE — Telephone Encounter (Signed)
Faxed.   KP 

## 2011-02-16 NOTE — Telephone Encounter (Signed)
Patient called to report that [facility] [address] [facility] faxed prescription to [place] for Lorazepam and has not heard back from us---please send prescription

## 2011-04-13 ENCOUNTER — Other Ambulatory Visit: Payer: Self-pay | Admitting: Family Medicine

## 2011-05-09 ENCOUNTER — Ambulatory Visit (INDEPENDENT_AMBULATORY_CARE_PROVIDER_SITE_OTHER): Payer: BC Managed Care – PPO | Admitting: Internal Medicine

## 2011-05-09 ENCOUNTER — Encounter: Payer: Self-pay | Admitting: Internal Medicine

## 2011-05-09 VITALS — BP 132/84 | HR 60 | Temp 98.1°F | Wt 372.0 lb

## 2011-05-09 DIAGNOSIS — H6120 Impacted cerumen, unspecified ear: Secondary | ICD-10-CM

## 2011-05-09 DIAGNOSIS — H9319 Tinnitus, unspecified ear: Secondary | ICD-10-CM

## 2011-05-09 DIAGNOSIS — H9190 Unspecified hearing loss, unspecified ear: Secondary | ICD-10-CM

## 2011-05-09 NOTE — Patient Instructions (Signed)
Please do not use Q-tips as we discussed. Should wax build up occur, please put 2-3 drops of mineral oil in the ear at night and cover the canal with a  cotton ball. In the morning fill the canal with hydrogen peroxide & leave  for 10-15 minutes. Following this shower and use the thinnest washrag available to wick out the wax.  

## 2011-05-09 NOTE — Progress Notes (Signed)
  Subjective:    Patient ID: Pam Phillips, female    DOB: [birth year], 50 y.o.   MRN: [id number]  HPI EAR PAIN: Location: left    Onset: 7/21 after swimming in ocean 7/18  Symptoms  Sensation of fullness: yes,  Ear discharge: no,  URI symptoms: no  Fever: no    Tinnitus: yes, minor  Dizziness: no Hearing loss: minor Headache: no Toothache: no Red Flags PMH recurrent OM: no  PMH prior ear surgery: no  Recent antibiotic usage (last 30 days):  no Diabetes or Immunosuppresion:  no      Review of Systems     Objective:   Physical Exam General appearance: no acute distress or increased work of breathing is present.  No  lymphadenopathy about the head, neck, or axilla noted.   Eyes: No conjunctival inflammation or lid edema is present. There is no scleral icterus.  Ears:  External ear exam shows no significant lesions or deformities.  Otoscopic examination reveals : cerumen impaction on L.. Unable to hear whisper on L @ 6 ft Nose:  External nasal examination shows no deformity or inflammation. Nasal mucosa are pink and moist without lesions or exudates. No septal dislocation or dislocation.No obstruction to airflow.   Oral exam: Dental hygiene is good; lips and gums are healthy appearing.There is no oropharyngeal erythema or exudate noted.   Neck:  No deformities, thyromegaly, masses, or tenderness noted.     Heart:  Normal rate and regular rhythm. S1 and S2 normal without gallop, murmur, click, rub or other extra sounds.   Lungs:Chest clear to auscultation; no wheezes, rhonchi,rales ,or rubs present.No increased work of breathing.    Extremities:  No cyanosis, edema, or clubbing  noted    Skin: Warm & dry w/o jaundice or tenting.          Assessment & Plan:  #1 cerumen impaction with associated discomfort, hearing loss, and tinnitus. Improved auditory acuity after soaking and gavage with hydrogen peroxide  Plan: Otic  hygiene protocol provided

## 2011-05-30 ENCOUNTER — Other Ambulatory Visit: Payer: Self-pay | Admitting: Family Medicine

## 2011-05-31 NOTE — Telephone Encounter (Signed)
Refill x1 

## 2011-05-31 NOTE — Telephone Encounter (Signed)
Last filled 02-16-11 #90 2, last OV 05-09-11 acute visit

## 2011-05-31 NOTE — Telephone Encounter (Signed)
Defer to Dr Laury Axon who will return tomorrow

## 2011-06-01 MED ORDER — LORAZEPAM 0.5 MG PO TABS: 0.5000 mg | ORAL_TABLET | Freq: Three times a day (TID) | ORAL | Status: DC

## 2011-06-01 NOTE — Telephone Encounter (Signed)
Rx faxed

## 2011-06-08 ENCOUNTER — Other Ambulatory Visit: Payer: Self-pay | Admitting: Family Medicine

## 2011-06-08 NOTE — Telephone Encounter (Signed)
Last seen 05/09/11 and filled 03/30/08--- please advise     KP

## 2011-07-10 ENCOUNTER — Encounter: Payer: Self-pay | Admitting: Family Medicine

## 2011-07-10 ENCOUNTER — Ambulatory Visit (INDEPENDENT_AMBULATORY_CARE_PROVIDER_SITE_OTHER): Payer: BC Managed Care – PPO | Admitting: Family Medicine

## 2011-07-10 DIAGNOSIS — F411 Generalized anxiety disorder: Secondary | ICD-10-CM

## 2011-07-10 DIAGNOSIS — R739 Hyperglycemia, unspecified: Secondary | ICD-10-CM

## 2011-07-10 DIAGNOSIS — I10 Essential (primary) hypertension: Secondary | ICD-10-CM

## 2011-07-10 DIAGNOSIS — R7309 Other abnormal glucose: Secondary | ICD-10-CM

## 2011-07-10 DIAGNOSIS — E785 Hyperlipidemia, unspecified: Secondary | ICD-10-CM

## 2011-07-10 DIAGNOSIS — Z23 Encounter for immunization: Secondary | ICD-10-CM

## 2011-07-10 DIAGNOSIS — R32 Unspecified urinary incontinence: Secondary | ICD-10-CM

## 2011-07-10 DIAGNOSIS — F419 Anxiety disorder, unspecified: Secondary | ICD-10-CM

## 2011-07-10 LAB — POCT URINALYSIS DIPSTICK
Bilirubin, UA: NEGATIVE
Blood, UA: NEGATIVE
Glucose, UA: NEGATIVE
Ketones, UA: NEGATIVE
Spec Grav, UA: 1.01
pH, UA: 5

## 2011-07-10 MED ORDER — OMEGA-3 FATTY ACIDS 1000 MG PO CAPS: 1.0000 g | ORAL_CAPSULE | Freq: Every day | ORAL | Status: DC

## 2011-07-10 MED ORDER — LORAZEPAM 0.5 MG PO TABS: 0.5000 mg | ORAL_TABLET | Freq: Three times a day (TID) | ORAL | Status: DC

## 2011-07-10 MED ORDER — TOLTERODINE TARTRATE ER 2 MG PO CP24: 2.0000 mg | ORAL_CAPSULE | Freq: Every day | ORAL | Status: DC

## 2011-07-10 MED ORDER — VALSARTAN-HYDROCHLOROTHIAZIDE 320-25 MG PO TABS: 1.0000 | ORAL_TABLET | Freq: Every day | ORAL | Status: DC

## 2011-07-10 NOTE — Progress Notes (Signed)
Addended by: Candie Echevaria L on: 07/10/2011 04:20 PM   Modules accepted: Orders

## 2011-07-10 NOTE — Patient Instructions (Signed)

## 2011-07-10 NOTE — Progress Notes (Signed)
  Subjective:    Patient here for follow-up of elevated blood pressure.  She is exercising and is adherent to a low-salt diet.  Blood pressure is well controlled at home. Cardiac symptoms: none. Patient denies: chest pain, chest pressure/discomfort, claudication, dyspnea, exertional chest pressure/discomfort, fatigue, irregular heart beat, lower extremity edema, near-syncope, orthopnea, palpitations, paroxysmal nocturnal dyspnea, syncope and tachypnea. Cardiovascular risk factors: dyslipidemia, hypertension and obesity (BMI >= 30 kg/m2). Use of agents associated with hypertension: none. History of target organ damage: none.  The following portions of the patient's history were reviewed and updated as appropriate: allergies, current medications, past family history, past medical history, past social history, past surgical history and problem list.  Review of Systems Pertinent items are noted in HPI.     Objective:    BP 130/72  Pulse 68  Temp(Src) 98.6 F (37 C) (Oral)  Wt 363 lb 12.8 oz (165.019 kg)  SpO2 98% General appearance: alert, cooperative, appears stated age and no distress Eyes: conjunctivae/corneas clear. PERRL, EOM's intact. Fundi benign. Ears: normal TM's and external ear canals both ears Nose: Nares normal. Septum midline. Mucosa normal. No drainage or sinus tenderness. Throat: lips, mucosa, and tongue normal; teeth and gums normal Neck: no adenopathy, no carotid bruit, no JVD, supple, symmetrical, trachea midline and thyroid not enlarged, symmetric, no tenderness/mass/nodules Back: na Lungs: clear to auscultation bilaterally Heart: regular rate and rhythm, S1, S2 normal, no murmur, click, rub or gallop Extremities: extremities normal, atraumatic, no cyanosis or edema and edema b/l pit    Assessment:    Hypertension, normal blood pressure . Evidence of target organ damage: none.   hyperlipidemia--con't meds Plan:  Check labs  Medication: no change.

## 2011-07-10 NOTE — Progress Notes (Signed)
Addended by: Arnette Norris on: 07/10/2011 09:20 AM   Modules accepted: Orders

## 2011-07-13 ENCOUNTER — Other Ambulatory Visit (INDEPENDENT_AMBULATORY_CARE_PROVIDER_SITE_OTHER): Payer: BC Managed Care – PPO

## 2011-07-13 DIAGNOSIS — R7309 Other abnormal glucose: Secondary | ICD-10-CM

## 2011-07-13 DIAGNOSIS — E785 Hyperlipidemia, unspecified: Secondary | ICD-10-CM

## 2011-07-13 DIAGNOSIS — I10 Essential (primary) hypertension: Secondary | ICD-10-CM

## 2011-07-13 DIAGNOSIS — R739 Hyperglycemia, unspecified: Secondary | ICD-10-CM

## 2011-07-13 NOTE — Progress Notes (Signed)
Labs only

## 2011-07-14 LAB — LIPID PANEL
Cholesterol: 151 mg/dL (ref 0–200)
Total CHOL/HDL Ratio: 3
Triglycerides: 76 mg/dL (ref 0.0–149.0)

## 2011-07-14 LAB — HEPATIC FUNCTION PANEL
ALT: 28 U/L (ref 0–35)
Total Protein: 7 g/dL (ref 6.0–8.3)

## 2011-07-14 LAB — BASIC METABOLIC PANEL
BUN: 13 mg/dL (ref 6–23)
CO2: 27 mEq/L (ref 19–32)
Chloride: 104 mEq/L (ref 96–112)
Creatinine, Ser: 0.6 mg/dL (ref 0.4–1.2)

## 2011-07-14 LAB — HEMOGLOBIN A1C: Hgb A1c MFr Bld: 5.4 % (ref 4.6–6.5)

## 2011-08-15 ENCOUNTER — Other Ambulatory Visit: Payer: Self-pay | Admitting: Family Medicine

## 2011-08-15 DIAGNOSIS — F419 Anxiety disorder, unspecified: Secondary | ICD-10-CM

## 2011-08-15 MED ORDER — LORAZEPAM 0.5 MG PO TABS: 0.5000 mg | ORAL_TABLET | Freq: Three times a day (TID) | ORAL | Status: DC

## 2011-08-15 NOTE — Telephone Encounter (Signed)
Faxed.   KP 

## 2011-08-15 NOTE — Telephone Encounter (Signed)
Last seen and filled 07/10/11 please advise   KP 

## 2011-09-19 ENCOUNTER — Other Ambulatory Visit: Payer: Self-pay | Admitting: Family Medicine

## 2011-09-19 DIAGNOSIS — F419 Anxiety disorder, unspecified: Secondary | ICD-10-CM

## 2011-09-19 MED ORDER — LORAZEPAM 0.5 MG PO TABS: 0.5000 mg | ORAL_TABLET | Freq: Three times a day (TID) | ORAL | Status: DC

## 2011-09-19 NOTE — Telephone Encounter (Signed)
Faxed.   KP 

## 2011-09-19 NOTE — Telephone Encounter (Signed)
Last seen 07/10/11 and filled 08/15/11 please advise    KP

## 2011-09-23 ENCOUNTER — Other Ambulatory Visit: Payer: Self-pay | Admitting: Family Medicine

## 2011-10-25 ENCOUNTER — Other Ambulatory Visit: Payer: Self-pay | Admitting: Family Medicine

## 2011-10-25 DIAGNOSIS — F419 Anxiety disorder, unspecified: Secondary | ICD-10-CM

## 2011-10-25 MED ORDER — LORAZEPAM 0.5 MG PO TABS: 0.5000 mg | ORAL_TABLET | Freq: Three times a day (TID) | ORAL | Status: DC

## 2011-10-25 NOTE — Telephone Encounter (Signed)
Refill x1   1 refill 

## 2011-10-25 NOTE — Telephone Encounter (Signed)
Faxed.   KP 

## 2011-10-25 NOTE — Telephone Encounter (Signed)
Last seen 07/10/11 and filled 09/19/11 # 90. Please advise    KP

## 2011-12-27 ENCOUNTER — Other Ambulatory Visit: Payer: Self-pay | Admitting: Family Medicine

## 2011-12-27 DIAGNOSIS — F419 Anxiety disorder, unspecified: Secondary | ICD-10-CM

## 2011-12-27 MED ORDER — LORAZEPAM 0.5 MG PO TABS: 0.5000 mg | ORAL_TABLET | Freq: Three times a day (TID) | ORAL | Status: DC

## 2011-12-27 NOTE — Telephone Encounter (Signed)
Last seen 07/10/11 and filled 10/25/11 # 90 with 1 refill. Please advise     KP

## 2011-12-27 NOTE — Telephone Encounter (Signed)
Refill for  Lorazepam 0.5mg  tab acta  Last fill date 2.2.13  No qty listed

## 2012-01-08 ENCOUNTER — Encounter: Payer: Self-pay | Admitting: Gastroenterology

## 2012-01-08 ENCOUNTER — Encounter: Payer: Self-pay | Admitting: Family Medicine

## 2012-01-08 ENCOUNTER — Ambulatory Visit (INDEPENDENT_AMBULATORY_CARE_PROVIDER_SITE_OTHER): Payer: BC Managed Care – PPO | Admitting: Family Medicine

## 2012-01-08 VITALS — BP 124/70 | HR 78 | Temp 98.0°F | Wt 356.6 lb

## 2012-01-08 DIAGNOSIS — E785 Hyperlipidemia, unspecified: Secondary | ICD-10-CM

## 2012-01-08 DIAGNOSIS — R319 Hematuria, unspecified: Secondary | ICD-10-CM

## 2012-01-08 DIAGNOSIS — Z23 Encounter for immunization: Secondary | ICD-10-CM

## 2012-01-08 DIAGNOSIS — I10 Essential (primary) hypertension: Secondary | ICD-10-CM

## 2012-01-08 DIAGNOSIS — Z0000 Encounter for general adult medical examination without abnormal findings: Secondary | ICD-10-CM

## 2012-01-08 LAB — POCT URINALYSIS DIPSTICK
Nitrite, UA: NEGATIVE
Protein, UA: NEGATIVE
Urobilinogen, UA: 0.2
pH, UA: 6

## 2012-01-08 LAB — HEPATIC FUNCTION PANEL
Alkaline Phosphatase: 57 U/L (ref 39–117)
Bilirubin, Direct: 0.1 mg/dL (ref 0.0–0.3)
Total Bilirubin: 0.7 mg/dL (ref 0.3–1.2)
Total Protein: 7 g/dL (ref 6.0–8.3)

## 2012-01-08 LAB — BASIC METABOLIC PANEL
Calcium: 9 mg/dL (ref 8.4–10.5)
Creatinine, Ser: 0.7 mg/dL (ref 0.4–1.2)
GFR: 98.4 mL/min (ref >60.00)
Sodium: 139 mEq/L (ref 135–145)

## 2012-01-08 LAB — LIPID PANEL
HDL: 45.9 mg/dL (ref >39.00)
LDL Cholesterol: 81 mg/dL (ref 0–99)
Total CHOL/HDL Ratio: 3
Triglycerides: 52 mg/dL (ref 0.0–149.0)
VLDL: 10.4 mg/dL (ref 0.0–40.0)

## 2012-01-08 NOTE — Patient Instructions (Addendum)
Hypertension As your heart beats, it forces blood through your arteries. This force is your blood pressure. If the pressure is too high, it is called hypertension (HTN) or high blood pressure. HTN is dangerous because you may have it and not know it. High blood pressure may mean that your heart has to work harder to pump blood. Your arteries may be narrow or stiff. The extra work puts you at risk for heart disease, stroke, and other problems.  Blood pressure consists of two numbers, a higher number over a lower, 110/72, for example. It is stated as "110 over 72." The ideal is below 120 for the top number (systolic) and under 80 for the bottom (diastolic). Write down your blood pressure today. You should pay close attention to your blood pressure if you have certain conditions such as:  Heart failure.   Prior heart attack.   Diabetes   Chronic kidney disease.   Prior stroke.   Multiple risk factors for heart disease.  To see if you have HTN, your blood pressure should be measured while you are seated with your arm held at the level of the heart. It should be measured at least twice. A one-time elevated blood pressure reading (especially in the Emergency Department) does not mean that you need treatment. There may be conditions in which the blood pressure is different between your right and left arms. It is important to see your caregiver soon for a recheck. Most people have essential hypertension which means that there is not a specific cause. This type of high blood pressure may be lowered by changing lifestyle factors such as:  Stress.   Smoking.   Lack of exercise.   Excessive weight.   Drug/tobacco/alcohol use.   Eating less salt.  Most people do not have symptoms from high blood pressure until it has caused damage to the body. Effective treatment can often prevent, delay or reduce that damage. TREATMENT  When a cause has been identified, treatment for high blood pressure is  directed at the cause. There are a large number of medications to treat HTN. These fall into several categories, and your caregiver will help you select the medicines that are best for you. Medications may have side effects. You should review side effects with your caregiver. If your blood pressure stays high after you have made lifestyle changes or started on medicines,   Your medication(s) may need to be changed.   Other problems may need to be addressed.   Be certain you understand your prescriptions, and know how and when to take your medicine.   Be sure to follow up with your caregiver within the time frame advised (usually within two weeks) to have your blood pressure rechecked and to review your medications.   If you are taking more than one medicine to lower your blood pressure, make sure you know how and at what times they should be taken. Taking two medicines at the same time can result in blood pressure that is too low.  SEEK IMMEDIATE MEDICAL CARE IF:  You develop a severe headache, blurred or changing vision, or confusion.   You have unusual weakness or numbness, or a faint feeling.   You have severe chest or abdominal pain, vomiting, or breathing problems.  MAKE SURE YOU:   Understand these instructions.   Will watch your condition.   Will get help right away if you are not doing well or get worse.  Document Released: 10/02/2005 Document Revised: 09/21/2011 Document Reviewed:   05/22/2008 ExitCare Patient Information 2012 [employer].Cholesterol Cholesterol is a white, waxy, fat-like protein needed by your body in small amounts. The liver makes all the cholesterol you need. It is carried from the liver by the blood through the blood vessels. Deposits (plaque) may build up on blood vessel walls. This makes the arteries narrower and stiffer. Plaque increases the risk for heart attack and stroke. You cannot feel your cholesterol level even if it is very high. The only way to  know is by a blood test to check your lipid (fats) levels. Once you know your cholesterol levels, you should keep a record of the test results. Work with your caregiver to to keep your levels in the desired range. WHAT THE RESULTS MEAN:  Total cholesterol is a rough measure of all the cholesterol in your blood.   LDL is the so-called bad cholesterol. This is the type that deposits cholesterol in the walls of the arteries. You want this level to be low.   HDL is the good cholesterol because it cleans the arteries and carries the LDL away. You want this level to be high.   Triglycerides are fat that the body can either burn for energy or store. High levels are closely linked to heart disease.  DESIRED LEVELS:  Total cholesterol below 200.   LDL below 100 for people at risk, below 70 for very high risk.   HDL above 50 is good, above 60 is best.   Triglycerides below 150.  HOW TO LOWER YOUR CHOLESTEROL:  Diet.   Choose fish or white meat chicken and turkey, roasted or baked. Limit fatty cuts of red meat, fried foods, and processed meats, such as sausage and lunch meat.   Eat lots of fresh fruits and vegetables. Choose whole grains, beans, pasta, potatoes and cereals.   Use only small amounts of olive, corn or canola oils. Avoid butter, mayonnaise, shortening or palm kernel oils. Avoid foods with trans-fats.   Use skim/nonfat milk and low-fat/nonfat yogurt and cheeses. Avoid whole milk, cream, ice cream, egg yolks and cheeses. Healthy desserts include angel food cake, ginger snaps, animal crackers, hard candy, popsicles, and low-fat/nonfat frozen yogurt. Avoid pastries, cakes, pies and cookies.   Exercise.   A regular program helps decrease LDL and raises HDL.   Helps with weight control.   Do things that increase your activity level like gardening, walking, or taking the stairs.   Medication.   May be prescribed by your caregiver to help lowering cholesterol and the risk for  heart disease.   You may need medicine even if your levels are normal if you have several risk factors.  HOME CARE INSTRUCTIONS    Follow your diet and exercise programs as suggested by your caregiver.   Take medications as directed.   Have blood work done when your caregiver feels it is necessary.  MAKE SURE YOU:    Understand these instructions.   Will watch your condition.   Will get help right away if you are not doing well or get worse.  Document Released: 06/27/2001 Document Revised: 09/21/2011 Document Reviewed: 12/18/2007 ExitCare Patient Information 2012 [employer]. 

## 2012-01-08 NOTE — Progress Notes (Signed)
  Subjective:    Patient here for follow-up of elevated blood pressure.  She is exercising and is adherent to a low-salt diet.  Blood pressure is well controlled at home. Cardiac symptoms: none. Patient denies: chest pain, chest pressure/discomfort, claudication, dyspnea, exertional chest pressure/discomfort, fatigue, irregular heart beat, lower extremity edema, near-syncope, orthopnea, palpitations, paroxysmal nocturnal dyspnea, syncope and tachypnea. Cardiovascular risk factors: dyslipidemia, hypertension and obesity (BMI >= 30 kg/m2). Use of agents associated with hypertension: none. History of target organ damage: none.  The following portions of the patient's history were reviewed and updated as appropriate: allergies, current medications, past family history, past medical history, past social history, past surgical history and problem list.  Review of Systems Pertinent items are noted in HPI.     Objective:    BP 124/70  Pulse 78  Temp(Src) 98 F (36.7 C) (Oral)  Wt 356 lb 9.6 oz (161.753 kg)  SpO2 98% General appearance: alert, cooperative, appears stated age and morbidly obese Lungs: clear to auscultation bilaterally Heart: S1, S2 normal Extremities: edema +pitting edema--- compression stockings on    Assessment:    Hypertension, normal blood pressure . Evidence of target organ damage: none.   Hyperlipidemia  Obesity Plan:    Medication: no change. Dietary sodium restriction. Regular aerobic exercise. Follow up: 6 months and as needed.

## 2012-01-08 NOTE — Progress Notes (Signed)
Addended by: Silvio Pate D on: 01/08/2012 10:13 AM   Modules accepted: Orders

## 2012-01-10 LAB — URINE CULTURE

## 2012-01-15 ENCOUNTER — Ambulatory Visit (INDEPENDENT_AMBULATORY_CARE_PROVIDER_SITE_OTHER): Payer: BC Managed Care – PPO | Admitting: Family Medicine

## 2012-01-15 ENCOUNTER — Encounter: Payer: Self-pay | Admitting: Family Medicine

## 2012-01-15 VITALS — BP 132/76 | HR 98 | Temp 103.1°F | Wt 353.2 lb

## 2012-01-15 DIAGNOSIS — R05 Cough: Secondary | ICD-10-CM

## 2012-01-15 DIAGNOSIS — J40 Bronchitis, not specified as acute or chronic: Secondary | ICD-10-CM

## 2012-01-15 DIAGNOSIS — R059 Cough, unspecified: Secondary | ICD-10-CM

## 2012-01-15 MED ORDER — ALBUTEROL SULFATE (5 MG/ML) 0.5% IN NEBU
2.5000 mg | INHALATION_SOLUTION | Freq: Once | RESPIRATORY_TRACT | Status: AC
  Administered 2012-01-15: 2.5 mg via RESPIRATORY_TRACT

## 2012-01-15 MED ORDER — MOXIFLOXACIN HCL 400 MG PO TABS: 400.0000 mg | ORAL_TABLET | Freq: Every day | ORAL | Status: AC

## 2012-01-15 MED ORDER — ALBUTEROL SULFATE HFA 108 (90 BASE) MCG/ACT IN AERS: 2.0000 | INHALATION_SPRAY | Freq: Four times a day (QID) | RESPIRATORY_TRACT | Status: DC | PRN

## 2012-01-15 NOTE — Progress Notes (Signed)
Addended by: Arnette Norris on: 01/15/2012 12:05 PM   Modules accepted: Orders

## 2012-01-15 NOTE — Patient Instructions (Signed)

## 2012-01-15 NOTE — Progress Notes (Signed)
  Subjective:     Pam Phillips is a 51 y.o. female here for evaluation of a cough. Onset of symptoms was 4 days ago. Symptoms have been gradually worsening since that time. The cough is productive and is aggravated by exercise and reclining position. Associated symptoms include: chills, fever, night sweats, postnasal drip, shortness of breath, sputum production and wheezing. Patient does not have a history of asthma. Patient does not have a history of environmental allergens. Patient has not traveled recently. Patient does not have a history of smoking. Patient has not had a previous chest x-ray. Patient has not had a PPD done.  The following portions of the patient's history were reviewed and updated as appropriate: allergies, current medications, past family history, past medical history, past social history, past surgical history and problem list.  Review of Systems Pertinent items are noted in HPI.    Objective:    Oxygen saturation 96% on room air BP 132/76  Pulse 98  Temp(Src) 103.1 F (39.5 C) (Oral)  Wt 353 lb 3.2 oz (160.21 kg)  SpO2 96% General appearance: alert, cooperative, appears stated age and no distress Ears: normal TM's and external ear canals both ears Nose: Nares normal. Septum midline. Mucosa normal. No drainage or sinus tenderness. Throat: abnormal findings: mild oropharyngeal erythema and no exudate Neck: mild anterior cervical adenopathy, supple, symmetrical, trachea midline and thyroid not enlarged, symmetric, no tenderness/mass/nodules Lungs: rales bibasilar and wheezes bilaterally Heart: regular rate and rhythm, S1, S2 normal, no murmur, click, rub or gallop    Assessment:    Acute Bronchitis ---- r/o pneumonia  Plan:    Antibiotics per medication orders. Antitussives per medication orders. Avoid exposure to tobacco smoke and fumes. B-agonist inhaler. Call if shortness of breath worsens, blood in sputum, change in character of cough, development of fever  or chills, inability to maintain nutrition and hydration. Avoid exposure to tobacco smoke and fumes. Chest x-ray.

## 2012-01-16 ENCOUNTER — Telehealth: Payer: Self-pay

## 2012-01-16 ENCOUNTER — Encounter (HOSPITAL_BASED_OUTPATIENT_CLINIC_OR_DEPARTMENT_OTHER): Payer: Self-pay

## 2012-01-16 ENCOUNTER — Ambulatory Visit (HOSPITAL_BASED_OUTPATIENT_CLINIC_OR_DEPARTMENT_OTHER)
Admission: RE | Admit: 2012-01-16 | Discharge: 2012-01-16 | Disposition: A | Discharge status: Home / Self Care | Payer: BC Managed Care – PPO | Source: Ambulatory Visit | Attending: Family Medicine | Admitting: Family Medicine

## 2012-01-16 DIAGNOSIS — R918 Other nonspecific abnormal finding of lung field: Secondary | ICD-10-CM

## 2012-01-16 DIAGNOSIS — R059 Cough, unspecified: Secondary | ICD-10-CM | POA: Insufficient documentation

## 2012-01-16 DIAGNOSIS — R0989 Other specified symptoms and signs involving the circulatory and respiratory systems: Secondary | ICD-10-CM | POA: Insufficient documentation

## 2012-01-16 DIAGNOSIS — R05 Cough: Secondary | ICD-10-CM

## 2012-01-16 DIAGNOSIS — R0602 Shortness of breath: Secondary | ICD-10-CM | POA: Insufficient documentation

## 2012-01-16 NOTE — Telephone Encounter (Signed)
msg left for a return call.      KP 

## 2012-01-16 NOTE — Telephone Encounter (Signed)
Discussed with patient and she stated she is still feeling crappy but she is only on her second dose of antibiotic, she agreed to complete the antibiotic and will call with any concerns.        KP

## 2012-01-16 NOTE — Telephone Encounter (Signed)
Message copied by Arnette Norris on Tue Jan 16, 2012  4:25 PM ------      Message from: Lelon Perla      Created: Tue Jan 16, 2012  3:39 PM       B/l pneumonia---finish abx      How is pt feeling?

## 2012-01-18 ENCOUNTER — Telehealth: Payer: Self-pay | Admitting: *Deleted

## 2012-01-18 NOTE — Telephone Encounter (Signed)
Diagnosed with pneumonia, how long should she stay out of work. Please advise     KP

## 2012-01-18 NOTE — Telephone Encounter (Signed)
Pt called & wanted to know how long does she need to stay out of work. Please advise.

## 2012-01-18 NOTE — Telephone Encounter (Signed)
Discussed with patient and scheduled follow up apt for next Tues.      KP

## 2012-01-18 NOTE — Telephone Encounter (Signed)
At least a week---she may need 2---- her pneumonia was a bad case--- we can start with week and we whould be seeing her soon--- we can extend it if we need to

## 2012-01-23 ENCOUNTER — Ambulatory Visit (INDEPENDENT_AMBULATORY_CARE_PROVIDER_SITE_OTHER): Payer: BC Managed Care – PPO | Admitting: Family Medicine

## 2012-01-23 ENCOUNTER — Encounter: Payer: Self-pay | Admitting: Family Medicine

## 2012-01-23 VITALS — BP 134/76 | HR 83 | Temp 98.3°F | Wt 347.2 lb

## 2012-01-23 DIAGNOSIS — J189 Pneumonia, unspecified organism: Secondary | ICD-10-CM

## 2012-01-23 NOTE — Progress Notes (Signed)
  Subjective:    Patient ID: Pam Phillips, female    DOB: 06/20/1961, 51 y.o.   MRN: [id number]  HPI Pt here for f/u pneumonia.  Pt is still coughing but feels much better.     Review of Systems As above    Objective:   Physical Exam  Constitutional: She is oriented to person, place, and time. She appears well-developed and well-nourished.  Pulmonary/Chest: Effort normal. No respiratory distress. She has no wheezes. She has rales.  Neurological: She is alert and oriented to person, place, and time.  Skin: Skin is warm and dry.  Psychiatric: She has a normal mood and affect. Her behavior is normal. Judgment and thought content normal.          Assessment & Plan:  Hx pneumonia---  Finish avelox--- 4 more days given---total 14 days                              cxr  ---  Repeat 2 weeks

## 2012-01-23 NOTE — Patient Instructions (Signed)
Finish abx--- total 14 days Repeat cxr in 2 weeks  rto if symptoms do not completely resolve

## 2012-02-03 ENCOUNTER — Encounter: Payer: Self-pay | Admitting: Family Medicine

## 2012-02-05 ENCOUNTER — Encounter: Payer: Self-pay | Admitting: Family Medicine

## 2012-02-09 ENCOUNTER — Ambulatory Visit (HOSPITAL_BASED_OUTPATIENT_CLINIC_OR_DEPARTMENT_OTHER)
Admission: RE | Admit: 2012-02-09 | Discharge: 2012-02-09 | Disposition: A | Discharge status: Home / Self Care | Payer: BC Managed Care – PPO | Source: Ambulatory Visit | Attending: Family Medicine | Admitting: Family Medicine

## 2012-02-09 DIAGNOSIS — Z09 Encounter for follow-up examination after completed treatment for conditions other than malignant neoplasm: Secondary | ICD-10-CM

## 2012-02-09 DIAGNOSIS — J189 Pneumonia, unspecified organism: Secondary | ICD-10-CM

## 2012-02-09 DIAGNOSIS — M5380 Other specified dorsopathies, site unspecified: Secondary | ICD-10-CM | POA: Insufficient documentation

## 2012-02-16 ENCOUNTER — Other Ambulatory Visit: Payer: BC Managed Care – PPO | Admitting: Gastroenterology

## 2012-02-28 ENCOUNTER — Telehealth: Payer: Self-pay | Admitting: Family Medicine

## 2012-02-28 DIAGNOSIS — F419 Anxiety disorder, unspecified: Secondary | ICD-10-CM

## 2012-02-28 NOTE — Telephone Encounter (Signed)
Refill: Lorazepam 0.5mg tab 

## 2012-02-28 NOTE — Telephone Encounter (Signed)
Ok for 1 refill

## 2012-02-28 NOTE — Telephone Encounter (Signed)
Last seen 01/23/12 and filled 12/27/11 # 90 with 1 refill. Please advise    KP

## 2012-02-29 MED ORDER — LORAZEPAM 0.5 MG PO TABS: 0.5000 mg | ORAL_TABLET | Freq: Three times a day (TID) | ORAL | Status: DC

## 2012-02-29 NOTE — Telephone Encounter (Signed)
Faxed.   KP 

## 2012-03-26 ENCOUNTER — Other Ambulatory Visit: Payer: Self-pay | Admitting: Family Medicine

## 2012-03-26 DIAGNOSIS — Z1231 Encounter for screening mammogram for malignant neoplasm of breast: Secondary | ICD-10-CM

## 2012-04-03 ENCOUNTER — Telehealth: Payer: Self-pay | Admitting: Family Medicine

## 2012-04-03 DIAGNOSIS — F419 Anxiety disorder, unspecified: Secondary | ICD-10-CM

## 2012-04-03 MED ORDER — LORAZEPAM 0.5 MG PO TABS: 0.5000 mg | ORAL_TABLET | Freq: Three times a day (TID) | ORAL | Status: DC

## 2012-04-03 NOTE — Telephone Encounter (Signed)
Faxed.   KP 

## 2012-04-03 NOTE — Telephone Encounter (Signed)
Last seen 01/23/12 and filled 02/29/12 # 90. Please advise     KP

## 2012-04-03 NOTE — Telephone Encounter (Signed)
Refill x1 

## 2012-04-03 NOTE — Telephone Encounter (Signed)
Refill: Lorazepam 0.5mg  tab. Last fill 02-29-12

## 2012-04-22 ENCOUNTER — Ambulatory Visit (HOSPITAL_BASED_OUTPATIENT_CLINIC_OR_DEPARTMENT_OTHER): Payer: BC Managed Care – PPO

## 2012-04-22 ENCOUNTER — Ambulatory Visit (HOSPITAL_BASED_OUTPATIENT_CLINIC_OR_DEPARTMENT_OTHER)
Admission: RE | Admit: 2012-04-22 | Discharge: 2012-04-22 | Disposition: A | Discharge status: Home / Self Care | Payer: BC Managed Care – PPO | Source: Ambulatory Visit | Attending: Family Medicine | Admitting: Family Medicine

## 2012-04-22 DIAGNOSIS — Z1231 Encounter for screening mammogram for malignant neoplasm of breast: Secondary | ICD-10-CM | POA: Insufficient documentation

## 2012-05-02 ENCOUNTER — Encounter: Payer: Self-pay | Admitting: Family Medicine

## 2012-05-06 ENCOUNTER — Ambulatory Visit (INDEPENDENT_AMBULATORY_CARE_PROVIDER_SITE_OTHER): Payer: BC Managed Care – PPO | Admitting: Family Medicine

## 2012-05-06 ENCOUNTER — Other Ambulatory Visit (HOSPITAL_COMMUNITY)
Admission: RE | Admit: 2012-05-06 | Discharge: 2012-05-06 | Disposition: A | Discharge status: Home / Self Care | Payer: BC Managed Care – PPO | Source: Ambulatory Visit | Attending: Family Medicine | Admitting: Family Medicine

## 2012-05-06 ENCOUNTER — Encounter: Payer: Self-pay | Admitting: Family Medicine

## 2012-05-06 VITALS — BP 132/74 | HR 72 | Temp 98.5°F | Ht 68.0 in | Wt 358.2 lb

## 2012-05-06 DIAGNOSIS — F411 Generalized anxiety disorder: Secondary | ICD-10-CM

## 2012-05-06 DIAGNOSIS — Z124 Encounter for screening for malignant neoplasm of cervix: Secondary | ICD-10-CM

## 2012-05-06 DIAGNOSIS — I10 Essential (primary) hypertension: Secondary | ICD-10-CM

## 2012-05-06 DIAGNOSIS — Z01419 Encounter for gynecological examination (general) (routine) without abnormal findings: Secondary | ICD-10-CM | POA: Insufficient documentation

## 2012-05-06 DIAGNOSIS — R7309 Other abnormal glucose: Secondary | ICD-10-CM

## 2012-05-06 DIAGNOSIS — E785 Hyperlipidemia, unspecified: Secondary | ICD-10-CM

## 2012-05-06 DIAGNOSIS — D229 Melanocytic nevi, unspecified: Secondary | ICD-10-CM

## 2012-05-06 DIAGNOSIS — Z0000 Encounter for general adult medical examination without abnormal findings: Secondary | ICD-10-CM

## 2012-05-06 DIAGNOSIS — F419 Anxiety disorder, unspecified: Secondary | ICD-10-CM

## 2012-05-06 DIAGNOSIS — R739 Hyperglycemia, unspecified: Secondary | ICD-10-CM

## 2012-05-06 DIAGNOSIS — D492 Neoplasm of unspecified behavior of bone, soft tissue, and skin: Secondary | ICD-10-CM

## 2012-05-06 MED ORDER — LORAZEPAM 0.5 MG PO TABS: 0.5000 mg | ORAL_TABLET | Freq: Three times a day (TID) | ORAL | Status: DC

## 2012-05-06 NOTE — Assessment & Plan Note (Signed)
stable °

## 2012-05-06 NOTE — Assessment & Plan Note (Signed)
Cont with diet and exercise 

## 2012-05-06 NOTE — Patient Instructions (Signed)
Preventive Care for Adults, Female A healthy lifestyle and preventive care can promote health and wellness. Preventive health guidelines for women include the following key practices.  A routine yearly physical is a good way to check with your caregiver about your health and preventive screening. It is a chance to share any concerns and updates on your health, and to receive a thorough exam.   Visit your dentist for a routine exam and preventive care every 6 months. Brush your teeth twice a day and floss once a day. Good oral hygiene prevents tooth decay and gum disease.   The frequency of eye exams is based on your age, health, family medical history, use of contact lenses, and other factors. Follow your caregiver's recommendations for frequency of eye exams.   Eat a healthy diet. Foods like vegetables, fruits, whole grains, low-fat dairy products, and lean protein foods contain the nutrients you need without too many calories. Decrease your intake of foods high in solid fats, added sugars, and salt. Eat the right amount of calories for you.Get information about a proper diet from your caregiver, if necessary.   Regular physical exercise is one of the most important things you can do for your health. Most adults should get at least 150 minutes of moderate-intensity exercise (any activity that increases your heart rate and causes you to sweat) each week. In addition, most adults need muscle-strengthening exercises on 2 or more days a week.   Maintain a healthy weight. The body mass index (BMI) is a screening tool to identify possible weight problems. It provides an estimate of body fat based on height and weight. Your caregiver can help determine your BMI, and can help you achieve or maintain a healthy weight.For adults 20 years and older:   A BMI below 18.5 is considered underweight.   A BMI of 18.5 to 24.9 is normal.   A BMI of 25 to 29.9 is considered overweight.   A BMI of 30 and above is  considered obese.   Maintain normal blood lipids and cholesterol levels by exercising and minimizing your intake of saturated fat. Eat a balanced diet with plenty of fruit and vegetables. Blood tests for lipids and cholesterol should begin at age 20 and be repeated every 5 years. If your lipid or cholesterol levels are high, you are over 50, or you are at high risk for heart disease, you may need your cholesterol levels checked more frequently.Ongoing high lipid and cholesterol levels should be treated with medicines if diet and exercise are not effective.   If you smoke, find out from your caregiver how to quit. If you do not use tobacco, do not start.   If you are pregnant, do not drink alcohol. If you are breastfeeding, be very cautious about drinking alcohol. If you are not pregnant and choose to drink alcohol, do not exceed 1 drink per day. One drink is considered to be 12 ounces (355 mL) of beer, 5 ounces (148 mL) of wine, or 1.5 ounces (44 mL) of liquor.   Avoid use of street drugs. Do not share needles with anyone. Ask for help if you need support or instructions about stopping the use of drugs.   High blood pressure causes heart disease and increases the risk of stroke. Your blood pressure should be checked at least every 1 to 2 years. Ongoing high blood pressure should be treated with medicines if weight loss and exercise are not effective.   If you are 55 to 52   years old, ask your caregiver if you should take aspirin to prevent strokes.   Diabetes screening involves taking a blood sample to check your fasting blood sugar level. This should be done once every 3 years, after age 45, if you are within normal weight and without risk factors for diabetes. Testing should be considered at a younger age or be carried out more frequently if you are overweight and have at least 1 risk factor for diabetes.   Breast cancer screening is essential preventive care for women. You should practice "breast  self-awareness." This means understanding the normal appearance and feel of your breasts and may include breast self-examination. Any changes detected, no matter how small, should be reported to a caregiver. Women in their 20s and 30s should have a clinical breast exam (CBE) by a caregiver as part of a regular health exam every 1 to 3 years. After age 40, women should have a CBE every year. Starting at age 40, women should consider having a mammography (breast X-ray test) every year. Women who have a family history of breast cancer should talk to their caregiver about genetic screening. Women at a high risk of breast cancer should talk to their caregivers about having magnetic resonance imaging (MRI) and a mammography every year.   The Pap test is a screening test for cervical cancer. A Pap test can show cell changes on the cervix that might become cervical cancer if left untreated. A Pap test is a procedure in which cells are obtained and examined from the lower end of the uterus (cervix).   Women should have a Pap test starting at age 21.   Between ages 21 and 29, Pap tests should be repeated every 2 years.   Beginning at age 30, you should have a Pap test every 3 years as long as the past 3 Pap tests have been normal.   Some women have medical problems that increase the chance of getting cervical cancer. Talk to your caregiver about these problems. It is especially important to talk to your caregiver if a new problem develops soon after your last Pap test. In these cases, your caregiver may recommend more frequent screening and Pap tests.   The above recommendations are the same for women who have or have not gotten the vaccine for human papillomavirus (HPV).   If you had a hysterectomy for a problem that was not cancer or a condition that could lead to cancer, then you no longer need Pap tests. Even if you no longer need a Pap test, a regular exam is a good idea to make sure no other problems are  starting.   If you are between ages 65 and 70, and you have had normal Pap tests going back 10 years, you no longer need Pap tests. Even if you no longer need a Pap test, a regular exam is a good idea to make sure no other problems are starting.   If you have had past treatment for cervical cancer or a condition that could lead to cancer, you need Pap tests and screening for cancer for at least 20 years after your treatment.   If Pap tests have been discontinued, risk factors (such as a new sexual partner) need to be reassessed to determine if screening should be resumed.   The HPV test is an additional test that may be used for cervical cancer screening. The HPV test looks for the virus that can cause the cell changes on the cervix.   The cells collected during the Pap test can be tested for HPV. The HPV test could be used to screen women aged 30 years and older, and should be used in women of any age who have unclear Pap test results. After the age of 30, women should have HPV testing at the same frequency as a Pap test.   Colorectal cancer can be detected and often prevented. Most routine colorectal cancer screening begins at the age of 50 and continues through age 75. However, your caregiver may recommend screening at an earlier age if you have risk factors for colon cancer. On a yearly basis, your caregiver may provide home test kits to check for hidden blood in the stool. Use of a small camera at the end of a tube, to directly examine the colon (sigmoidoscopy or colonoscopy), can detect the earliest forms of colorectal cancer. Talk to your caregiver about this at age 50, when routine screening begins. Direct examination of the colon should be repeated every 5 to 10 years through age 75, unless early forms of pre-cancerous polyps or small growths are found.   Hepatitis C blood testing is recommended for all people born from 1945 through 1965 and any individual with known risks for hepatitis C.    Practice safe sex. Use condoms and avoid high-risk sexual practices to reduce the spread of sexually transmitted infections (STIs). STIs include gonorrhea, chlamydia, syphilis, trichomonas, herpes, HPV, and human immunodeficiency virus (HIV). Herpes, HIV, and HPV are viral illnesses that have no cure. They can result in disability, cancer, and death. Sexually active women aged 25 and younger should be checked for chlamydia. Older women with new or multiple partners should also be tested for chlamydia. Testing for other STIs is recommended if you are sexually active and at increased risk.   Osteoporosis is a disease in which the bones lose minerals and strength with aging. This can result in serious bone fractures. The risk of osteoporosis can be identified using a bone density scan. Women ages 65 and over and women at risk for fractures or osteoporosis should discuss screening with their caregivers. Ask your caregiver whether you should take a calcium supplement or vitamin D to reduce the rate of osteoporosis.   Menopause can be associated with physical symptoms and risks. Hormone replacement therapy is available to decrease symptoms and risks. You should talk to your caregiver about whether hormone replacement therapy is right for you.   Use sunscreen with sun protection factor (SPF) of 30 or more. Apply sunscreen liberally and repeatedly throughout the day. You should seek shade when your shadow is shorter than you. Protect yourself by wearing long sleeves, pants, a wide-brimmed hat, and sunglasses year round, whenever you are outdoors.   Once a month, do a whole body skin exam, using a mirror to look at the skin on your back. Notify your caregiver of new moles, moles that have irregular borders, moles that are larger than a pencil eraser, or moles that have changed in shape or color.   Stay current with required immunizations.   Influenza. You need a dose every fall (or winter). The composition of  the flu vaccine changes each year, so being vaccinated once is not enough.   Pneumococcal polysaccharide. You need 1 to 2 doses if you smoke cigarettes or if you have certain chronic medical conditions. You need 1 dose at age 65 (or older) if you have never been vaccinated.   Tetanus, diphtheria, pertussis (Tdap, Td). Get 1 dose of   Tdap vaccine if you are younger than age 65, are over 65 and have contact with an infant, are a healthcare worker, are pregnant, or simply want to be protected from whooping cough. After that, you need a Td booster dose every 10 years. Consult your caregiver if you have not had at least 3 tetanus and diphtheria-containing shots sometime in your life or have a deep or dirty wound.   HPV. You need this vaccine if you are a woman age 26 or younger. The vaccine is given in 3 doses over 6 months.   Measles, mumps, rubella (MMR). You need at least 1 dose of MMR if you were born in 1957 or later. You may also need a second dose.   Meningococcal. If you are age 19 to 21 and a first-year college student living in a residence hall, or have one of several medical conditions, you need to get vaccinated against meningococcal disease. You may also need additional booster doses.   Zoster (shingles). If you are age 60 or older, you should get this vaccine.   Varicella (chickenpox). If you have never had chickenpox or you were vaccinated but received only 1 dose, talk to your caregiver to find out if you need this vaccine.   Hepatitis A. You need this vaccine if you have a specific risk factor for hepatitis A virus infection or you simply wish to be protected from this disease. The vaccine is usually given as 2 doses, 6 to 18 months apart.   Hepatitis B. You need this vaccine if you have a specific risk factor for hepatitis B virus infection or you simply wish to be protected from this disease. The vaccine is given in 3 doses, usually over 6 months.  Preventive Services /  Frequency Ages 19 to 39  Blood pressure check.** / Every 1 to 2 years.   Lipid and cholesterol check.** / Every 5 years beginning at age 20.   Clinical breast exam.** / Every 3 years for women in their 20s and 30s.   Pap test.** / Every 2 years from ages 21 through 29. Every 3 years starting at age 30 through age 65 or 70 with a history of 3 consecutive normal Pap tests.   HPV screening.** / Every 3 years from ages 30 through ages 65 to 70 with a history of 3 consecutive normal Pap tests.   Hepatitis C blood test.** / For any individual with known risks for hepatitis C.   Skin self-exam. / Monthly.   Influenza immunization.** / Every year.   Pneumococcal polysaccharide immunization.** / 1 to 2 doses if you smoke cigarettes or if you have certain chronic medical conditions.   Tetanus, diphtheria, pertussis (Tdap, Td) immunization. / A one-time dose of Tdap vaccine. After that, you need a Td booster dose every 10 years.   HPV immunization. / 3 doses over 6 months, if you are 26 and younger.   Measles, mumps, rubella (MMR) immunization. / You need at least 1 dose of MMR if you were born in 1957 or later. You may also need a second dose.   Meningococcal immunization. / 1 dose if you are age 19 to 21 and a first-year college student living in a residence hall, or have one of several medical conditions, you need to get vaccinated against meningococcal disease. You may also need additional booster doses.   Varicella immunization.** / Consult your caregiver.   Hepatitis A immunization.** / Consult your caregiver. 2 doses, 6 to 18 months   apart.   Hepatitis B immunization.** / Consult your caregiver. 3 doses usually over 6 months.  Ages 40 to 64  Blood pressure check.** / Every 1 to 2 years.   Lipid and cholesterol check.** / Every 5 years beginning at age 20.   Clinical breast exam.** / Every year after age 40.   Mammogram.** / Every year beginning at age 40 and continuing for as  long as you are in good health. Consult with your caregiver.   Pap test.** / Every 3 years starting at age 30 through age 65 or 70 with a history of 3 consecutive normal Pap tests.   HPV screening.** / Every 3 years from ages 30 through ages 65 to 70 with a history of 3 consecutive normal Pap tests.   Fecal occult blood test (FOBT) of stool. / Every year beginning at age 50 and continuing until age 75. You may not need to do this test if you get a colonoscopy every 10 years.   Flexible sigmoidoscopy or colonoscopy.** / Every 5 years for a flexible sigmoidoscopy or every 10 years for a colonoscopy beginning at age 50 and continuing until age 75.   Hepatitis C blood test.** / For all people born from 1945 through 1965 and any individual with known risks for hepatitis C.   Skin self-exam. / Monthly.   Influenza immunization.** / Every year.   Pneumococcal polysaccharide immunization.** / 1 to 2 doses if you smoke cigarettes or if you have certain chronic medical conditions.   Tetanus, diphtheria, pertussis (Tdap, Td) immunization.** / A one-time dose of Tdap vaccine. After that, you need a Td booster dose every 10 years.   Measles, mumps, rubella (MMR) immunization. / You need at least 1 dose of MMR if you were born in 1957 or later. You may also need a second dose.   Varicella immunization.** / Consult your caregiver.   Meningococcal immunization.** / Consult your caregiver.   Hepatitis A immunization.** / Consult your caregiver. 2 doses, 6 to 18 months apart.   Hepatitis B immunization.** / Consult your caregiver. 3 doses, usually over 6 months.  Ages 65 and over  Blood pressure check.** / Every 1 to 2 years.   Lipid and cholesterol check.** / Every 5 years beginning at age 20.   Clinical breast exam.** / Every year after age 40.   Mammogram.** / Every year beginning at age 40 and continuing for as long as you are in good health. Consult with your caregiver.   Pap test.** /  Every 3 years starting at age 30 through age 65 or 70 with a 3 consecutive normal Pap tests. Testing can be stopped between 65 and 70 with 3 consecutive normal Pap tests and no abnormal Pap or HPV tests in the past 10 years.   HPV screening.** / Every 3 years from ages 30 through ages 65 or 70 with a history of 3 consecutive normal Pap tests. Testing can be stopped between 65 and 70 with 3 consecutive normal Pap tests and no abnormal Pap or HPV tests in the past 10 years.   Fecal occult blood test (FOBT) of stool. / Every year beginning at age 50 and continuing until age 75. You may not need to do this test if you get a colonoscopy every 10 years.   Flexible sigmoidoscopy or colonoscopy.** / Every 5 years for a flexible sigmoidoscopy or every 10 years for a colonoscopy beginning at age 50 and continuing until age 75.   Hepatitis   C blood test.** / For all people born from 1945 through 1965 and any individual with known risks for hepatitis C.   Osteoporosis screening.** / A one-time screening for women ages 65 and over and women at risk for fractures or osteoporosis.   Skin self-exam. / Monthly.   Influenza immunization.** / Every year.   Pneumococcal polysaccharide immunization.** / 1 dose at age 65 (or older) if you have never been vaccinated.   Tetanus, diphtheria, pertussis (Tdap, Td) immunization. / A one-time dose of Tdap vaccine if you are over 65 and have contact with an infant, are a healthcare worker, or simply want to be protected from whooping cough. After that, you need a Td booster dose every 10 years.   Varicella immunization.** / Consult your caregiver.   Meningococcal immunization.** / Consult your caregiver.   Hepatitis A immunization.** / Consult your caregiver. 2 doses, 6 to 18 months apart.   Hepatitis B immunization.** / Check with your caregiver. 3 doses, usually over 6 months.  ** Family history and personal history of risk and conditions may change your caregiver's  recommendations. Document Released: 11/28/2001 Document Revised: 09/21/2011 Document Reviewed: 02/27/2011 [employer] Patient Information 2012 [employer]. 

## 2012-05-06 NOTE — Progress Notes (Signed)
Subjective:     Pam Phillips is a 51 y.o. female and is here for a comprehensive physical exam. The patient reports problems - swelling over L eye--no itchy or painful.  History   Social History  . Marital Status: Married    Spouse Name: N/A    Number of Children: N/A  . 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 Smoking status: Never Smoker   . Smokeless tobacco: Not on file  . Alcohol Use: No  . Drug Use: No  . Sexually Active: Yes -- Female partner(s)   Other Topics Concern  . Not on file   Social History Narrative   Exercise-- zumba 4-5 x a week in summer,  2-3 x during school    Health Maintenance  Topic Date Due  . Colonoscopy  06/10/2010  . Influenza Vaccine  07/16/2012  . Mammogram  04/22/2014  . Pap Smear  05/07/2015  . Tetanus/tdap  01/07/2022    The following portions of the patient's history were reviewed and updated as appropriate: allergies, current medications, past family history, past medical history, past social history, past surgical history and problem list.  Review of Systems Review of Systems  Constitutional: Negative for activity change, appetite change and fatigue.  HENT: Negative for hearing loss, congestion, tinnitus and ear discharge.  dentist -due Eyes: Negative for visual disturbance (see optho q1y -- vision corrected to 20/20 with glasses).  Respiratory: Negative for cough, chest tightness and shortness of breath.   Cardiovascular: Negative for chest pain, palpitations and leg swelling.  Gastrointestinal: Negative for abdominal pain, diarrhea, constipation and abdominal distention.  Genitourinary: Negative for urgency, frequency, decreased urine volume and difficulty urinating.  Musculoskeletal: Negative for back pain, arthralgias and gait problem.  Skin: Negative for color change, pallor and rash.  Neurological: Negative for dizziness, light-headedness, numbness and  headaches.  Hematological: Negative for adenopathy. Does not bruise/bleed easily.  Psychiatric/Behavioral: Negative for suicidal ideas, confusion, sleep disturbance, self-injury, dysphoric mood, decreased concentration and agitation.       Objective:    BP 132/74  Pulse 72  Temp 98.5 F (36.9 C) (Oral)  Ht 5\' 8"  (1.727 m)  Wt 358 lb 3.2 oz (162.478 kg)  BMI 54.46 kg/m2  SpO2 97% General appearance: alert, cooperative, appears stated age and no distress Head: Normocephalic, without obvious abnormality, atraumatic Eyes: conjunctivae/corneas clear. PERRL, EOM's intact. Fundi benign. Ears: normal TM's and external ear canals both ears Nose: Nares normal. Septum midline. Mucosa normal. No drainage or sinus tenderness. Throat: lips, mucosa, and tongue normal; teeth and gums normal Neck: no adenopathy, no carotid bruit, no JVD, supple, symmetrical, trachea midline and thyroid not enlarged, symmetric, no tenderness/mass/nodules Back: symmetric, no curvature. ROM normal. No CVA tenderness. Lungs: clear to auscultation bilaterally Breasts: normal appearance, no masses or tenderness Heart: +S1S2   + murmur Abdomen: soft, non-tender; bowel sounds normal; no masses,  no organomegaly Pelvic: cervix normal in appearance, external genitalia normal, no adnexal masses or tenderness, no cervical motion tenderness, rectovaginal septum normal, uterus normal size, shape, and consistency and vagina normal without discharge Extremities: extremities normal, atraumatic, no cyanosis or edema Pulses: 2+ and symmetric Skin: + multiple moles on back Lymph nodes: Cervical, supraclavicular, and axillary nodes normal. Neurologic: Grossly normal psych-- no depression, anxiety    Assessment:    Healthy female exam.      Plan:    fasting labs ghm utd See After  Visit Summary for Counseling Recommendations

## 2012-05-06 NOTE — Assessment & Plan Note (Signed)
Cont meds Check labs 

## 2012-05-06 NOTE — Assessment & Plan Note (Signed)
Stable con't meds 

## 2012-05-07 ENCOUNTER — Encounter: Payer: Self-pay | Admitting: Gastroenterology

## 2012-05-11 ENCOUNTER — Other Ambulatory Visit: Payer: Self-pay | Admitting: Family Medicine

## 2012-05-13 MED ORDER — LOVASTATIN 40 MG PO TABS: 40.0000 mg | ORAL_TABLET | Freq: Every day | ORAL | Status: DC

## 2012-05-13 NOTE — Addendum Note (Signed)
Addended by: Arnette Norris on: 05/13/2012 09:46 AM   Modules accepted: Orders

## 2012-05-31 ENCOUNTER — Other Ambulatory Visit: Payer: Self-pay | Admitting: Dermatology

## 2012-06-11 ENCOUNTER — Encounter: Payer: Self-pay | Admitting: Family Medicine

## 2012-06-11 ENCOUNTER — Other Ambulatory Visit: Payer: Self-pay | Admitting: Family Medicine

## 2012-06-11 DIAGNOSIS — F419 Anxiety disorder, unspecified: Secondary | ICD-10-CM

## 2012-06-11 MED ORDER — LORAZEPAM 0.5 MG PO TABS: 0.5000 mg | ORAL_TABLET | Freq: Three times a day (TID) | ORAL | Status: DC

## 2012-06-11 NOTE — Telephone Encounter (Signed)
Please advise      KP 

## 2012-06-11 NOTE — Telephone Encounter (Signed)
Refill LORazepam (Tab) 0.5 MG Take 1 tablet (0.5 mg total) by mouth 3 (three) times daily. as needed Last wrt 7.22.13 #90, last ov 7.22.13 V70

## 2012-06-11 NOTE — Telephone Encounter (Signed)
Can refill x 1 

## 2012-06-18 ENCOUNTER — Ambulatory Visit (INDEPENDENT_AMBULATORY_CARE_PROVIDER_SITE_OTHER): Payer: BC Managed Care – PPO | Admitting: General Surgery

## 2012-06-18 ENCOUNTER — Encounter (INDEPENDENT_AMBULATORY_CARE_PROVIDER_SITE_OTHER): Payer: Self-pay | Admitting: General Surgery

## 2012-06-18 VITALS — BP 136/80 | HR 66 | Temp 96.1°F | Resp 18 | Ht 68.0 in | Wt 363.6 lb

## 2012-06-18 DIAGNOSIS — C4359 Malignant melanoma of other part of trunk: Secondary | ICD-10-CM

## 2012-06-18 DIAGNOSIS — D0359 Melanoma in situ of other part of trunk: Secondary | ICD-10-CM

## 2012-06-18 NOTE — Patient Instructions (Signed)
Will plan wide local excision of back melanoma in situ.    Risks are bleeding, infection, numbness, and wound dehiscence.

## 2012-06-18 NOTE — Progress Notes (Signed)
Chief Complaint  Patient presents with  . Follow-up    melonma  upper back    HISTORY: The patient is a 51-year-old female who was diagnosed with melanoma in situ at Dr. Lupton's office. She was found to have a concerning lesion on her back by her husband. He did not recall lesion prior to 4 months ago. He noticed it started becoming darker and irregular.  She brought this to the attention of Dr. Lowne.  She was referred to dermatology for evaluation.  She does state that that area it reached, but she is not sure there was lesion itself that was itching. She does have dry skin and this is commonly a problem. The lesion did not have an ulcer. She denied any family history of melanoma. She has not had a skin cancer previously. Her husband has been concerned about additional lesion on her back. He states it is on has become more raised and darker since he had previously recognized it.  Past Medical History  Diagnosis Date  . Anxiety   . Hypertension   . Hyperlipidemia     Past surgical history:   Gallbladder  Oral surgery  Current Outpatient Prescriptions  Medication Sig Dispense Refill  . aspirin 81 MG EC tablet Take 81 mg by mouth daily.        . Elastic Bandages & Supports (MEDICAL COMPRESSION STOCKINGS) MISC 1 Device by Does not apply route daily.        . fish oil-omega-3 fatty acids 1000 MG capsule Take 1 capsule (1 g total) by mouth daily.  120 capsule  11  . LORazepam (ATIVAN) 0.5 MG tablet Take 1 tablet (0.5 mg total) by mouth 3 (three) times daily. as needed  90 tablet  0  . lovastatin (MEVACOR) 40 MG tablet Take 1 tablet (40 mg total) by mouth at bedtime.  90 tablet  0  . Multiple Vitamin (MULTIVITAMIN) tablet Take 1 tablet by mouth daily.        . nystatin (NYSTOP) 100000 UNIT/GM POWD APPLY THREE (3) TIMES DAILY  60 g  0  . tolterodine (DETROL LA) 2 MG 24 hr capsule Take 1 capsule (2 mg total) by mouth daily.  90 capsule  3  . valsartan-hydrochlorothiazide (DIOVAN HCT) 320-25  MG per tablet Take 1 tablet by mouth daily.  30 tablet  5  . albuterol (PROVENTIL HFA;VENTOLIN HFA) 108 (90 BASE) MCG/ACT inhaler Inhale 2 puffs into the lungs every 6 (six) hours as needed for wheezing.  1 Inhaler  0     Allergies  Allergen Reactions  . Codeine     Nausea & vomiting  . Penicillins     angioedema     Family History  Problem Relation Age of Onset  . Coronary artery disease  74    1st degree female  . Hypertension Father   . COPD Father   . Heart disease Father 74    MI  . Cancer Father 74    prostate ca     History   Social History  . Marital Status: Married    Spouse Name: N/A    Number of Children: N/A  . 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 Smoking status: Never Smoker   . Smokeless tobacco: None  . Alcohol Use: No  . Drug Use: No  . Sexually Active: Yes -- Female partner(s)   Other Topics Concern  .   None   Social History Narrative   Exercise-- zumba 4-5 x a week in summer,  2-3 x during school      REVIEW OF SYSTEMS - PERTINENT POSITIVES ONLY: 12 point review of systems negative other than HPI and PMH   EXAM: Filed Vitals:   06/18/12 1152  BP: 136/80  Pulse: 66  Temp: 96.1 F (35.6 C)  Resp: 18    Gen:  No acute distress.  Well nourished and well groomed.   Neurological: Alert and oriented to person, place, and time. Coordination normal.  Head: Normocephalic and atraumatic.  Eyes: Conjunctivae are normal. Pupils are equal, round, and reactive to light. No scleral icterus.  Neck: Normal range of motion. Neck supple. No tracheal deviation or thyromegaly present.  Cardiovascular: Normal rate, regular rhythm, normal heart sounds and intact distal pulses.  Exam reveals no gallop and no friction rub.  No murmur heard. Respiratory: Effort normal.  No respiratory distress. No chest wall tenderness. Breath sounds normal.  No wheezes, rales or rhonchi.  GI:  Soft. Bowel sounds are normal. The abdomen is soft and nontender.  There is no rebound and no guarding.  Musculoskeletal: Normal range of motion. Extremities are nontender.  Lymphadenopathy: No cervical, preauricular, postauricular or axillary adenopathy is present Skin: Skin is warm and dry. No rash noted. No diaphoresis. No erythema. No pallor. No clubbing, cyanosis, or edema.  Biopsy site on back scabbed over.  No adjacent pigmentation.   Nearby mole looks like seborrheic keratosis.   Psychiatric: Normal mood and affect. Behavior is normal. Judgment and thought content normal.     RADIOLOGY RESULTS: See E-Chart or I-Site for most recent results.  Images and reports are reviewed. Diagnosis T Skin , (A) center of back, shave removal O MELANOMA IN SITU WITH REGRESSION   ASSESSMENT AND PLAN: Melanoma in situ of back Wide local excision and advancement flap closure of back melanoma in situ.  Discussed risks of surgery including bleeding, infection, damage to advancement flap closure. Numbness will definitely occur.    Discussed reason for surgery (decrease risk of recurrence)  Will also biopsy nearby nevus.       Catilyn Boggus L Domnick Chervenak MD Surgical Oncology, General and Endocrine Surgery [facility]      Visit Diagnoses: 1. Melanoma in situ of back     Primary Care Physician: Yvonne Lowne, DO    

## 2012-06-18 NOTE — Assessment & Plan Note (Signed)
Wide local excision and advancement flap closure of back melanoma in situ.  Discussed risks of surgery including bleeding, infection, damage to advancement flap closure. Numbness will definitely occur.    Discussed reason for surgery (decrease risk of recurrence)  Will also biopsy nearby nevus.

## 2012-06-21 ENCOUNTER — Encounter: Payer: BC Managed Care – PPO | Admitting: Gastroenterology

## 2012-06-27 ENCOUNTER — Encounter: Payer: Self-pay | Admitting: Family Medicine

## 2012-06-27 NOTE — Progress Notes (Signed)
Dr  Donell Beers-  Please add pre op orders- has appt 07/04/12 PST   Thanks

## 2012-06-30 ENCOUNTER — Other Ambulatory Visit: Payer: Self-pay | Admitting: Family Medicine

## 2012-07-01 ENCOUNTER — Encounter (HOSPITAL_COMMUNITY): Payer: Self-pay | Admitting: Pharmacy Technician

## 2012-07-02 ENCOUNTER — Other Ambulatory Visit (INDEPENDENT_AMBULATORY_CARE_PROVIDER_SITE_OTHER): Payer: Self-pay | Admitting: General Surgery

## 2012-07-02 NOTE — Progress Notes (Signed)
DR. Levy Sjogren Ross HAS A PREOP APPOINTMENT AT [facility] ON Thursday  9/19  AT 8:30 AM.  PLEASE ENTER HER PREOP ORDERS IN EPIC.                  [facility] RN

## 2012-07-04 ENCOUNTER — Encounter (HOSPITAL_COMMUNITY)
Admission: RE | Admit: 2012-07-04 | Discharge: 2012-07-04 | Disposition: A | Discharge status: Home / Self Care | Payer: BC Managed Care – PPO | Source: Ambulatory Visit | Attending: General Surgery | Admitting: General Surgery

## 2012-07-04 ENCOUNTER — Encounter (HOSPITAL_COMMUNITY): Payer: Self-pay

## 2012-07-04 DIAGNOSIS — C801 Malignant (primary) neoplasm, unspecified: Secondary | ICD-10-CM

## 2012-07-04 DIAGNOSIS — M25473 Effusion, unspecified ankle: Secondary | ICD-10-CM

## 2012-07-04 HISTORY — DX: Cardiac murmur, unspecified: R01.1

## 2012-07-04 HISTORY — PX: CHOLECYSTECTOMY: SHX55

## 2012-07-04 HISTORY — DX: Malignant (primary) neoplasm, unspecified: C80.1

## 2012-07-04 HISTORY — DX: Effusion, unspecified ankle: M25.473

## 2012-07-04 HISTORY — PX: DENTAL SURGERY: SHX609

## 2012-07-04 LAB — CBC
HCT: 37.1 % (ref 36.0–46.0)
MCHC: 33.2 g/dL (ref 30.0–36.0)
MCV: 92.8 fL (ref 78.0–100.0)
RDW: 12.8 % (ref 11.5–15.5)

## 2012-07-04 LAB — BASIC METABOLIC PANEL
BUN: 11 mg/dL (ref 6–23)
Calcium: 9 mg/dL (ref 8.4–10.5)
Creatinine, Ser: 0.55 mg/dL (ref 0.50–1.10)
GFR calc Af Amer: >90 mL/min (ref >90)
GFR calc non Af Amer: >90 mL/min (ref >90)

## 2012-07-04 NOTE — Patient Instructions (Signed)
[address] Pam Phillips  07/04/2012   Your procedure is scheduled on:   -07-10-2012  Report to [facility] at       0630 AM ..  Call this number if you have problems the morning of surgery: [phone]  Or Presurgical Testing [phone]([name])   Remember:   Do not eat food:After Midnight.    Take these medicines the morning of surgery with A SIP OF WATER: Lorazepam   Do not wear jewelry, make-up or nail polish.  Do not wear lotions, powders, or perfumes. You may wear deodorant.  Do not shave 48 hours prior to surgery.(face and neck okay, no shaving of legs)  Do not bring valuables to the hospital.  Contacts, dentures or bridgework may not be worn into surgery.  Leave suitcase in the car. After surgery it may be brought to your room.  For patients admitted to the hospital, checkout time is 11:00 AM the day of discharge.   Patients discharged the day of surgery will not be allowed to drive home. Must have responsible person with you x 24 hours once discharged.  Name and phone number of your driver: spouse [name]- [phone] cell  Special Instructions: CHG Shower Use Special Wash: 1/2 bottle night before surgery and 1/2 bottle morning of surgery.(avoid face and genitals)-see special instruction sheet   Please read over the following fact sheets that you were given: MRSA Information.

## 2012-07-07 ENCOUNTER — Other Ambulatory Visit: Payer: Self-pay | Admitting: Family Medicine

## 2012-07-08 ENCOUNTER — Other Ambulatory Visit: Payer: BC Managed Care – PPO

## 2012-07-10 ENCOUNTER — Encounter (HOSPITAL_COMMUNITY)
Admission: RE | Disposition: A | Discharge status: Home / Self Care | Payer: Self-pay | Source: Ambulatory Visit | Attending: General Surgery

## 2012-07-10 ENCOUNTER — Ambulatory Visit (HOSPITAL_COMMUNITY)
Admission: RE | Admit: 2012-07-10 | Discharge: 2012-07-10 | Disposition: A | Discharge status: Home / Self Care | Payer: BC Managed Care – PPO | Source: Ambulatory Visit | Attending: General Surgery | Admitting: General Surgery

## 2012-07-10 ENCOUNTER — Ambulatory Visit (HOSPITAL_COMMUNITY): Payer: BC Managed Care – PPO | Admitting: Anesthesiology

## 2012-07-10 ENCOUNTER — Encounter (HOSPITAL_COMMUNITY): Payer: Self-pay | Admitting: Anesthesiology

## 2012-07-10 ENCOUNTER — Encounter (HOSPITAL_COMMUNITY): Payer: Self-pay | Admitting: *Deleted

## 2012-07-10 DIAGNOSIS — C4359 Malignant melanoma of other part of trunk: Secondary | ICD-10-CM | POA: Insufficient documentation

## 2012-07-10 DIAGNOSIS — I10 Essential (primary) hypertension: Secondary | ICD-10-CM | POA: Insufficient documentation

## 2012-07-10 DIAGNOSIS — E785 Hyperlipidemia, unspecified: Secondary | ICD-10-CM | POA: Insufficient documentation

## 2012-07-10 DIAGNOSIS — Z01812 Encounter for preprocedural laboratory examination: Secondary | ICD-10-CM | POA: Insufficient documentation

## 2012-07-10 DIAGNOSIS — Z79899 Other long term (current) drug therapy: Secondary | ICD-10-CM | POA: Insufficient documentation

## 2012-07-10 HISTORY — PX: MELANOMA EXCISION: SHX5266

## 2012-07-10 SURGERY — EXCISION, MELANOMA
Anesthesia: Monitor Anesthesia Care | Site: Back | Wound class: Clean

## 2012-07-10 MED ORDER — ACETAMINOPHEN 10 MG/ML IV SOLN
INTRAVENOUS | Status: AC
  Filled 2012-07-10: qty 100

## 2012-07-10 MED ORDER — ACETAMINOPHEN 10 MG/ML IV SOLN
INTRAVENOUS | Status: DC | PRN
  Administered 2012-07-10: 1000 mg via INTRAVENOUS

## 2012-07-10 MED ORDER — CIPROFLOXACIN IN D5W 400 MG/200ML IV SOLN
400.0000 mg | INTRAVENOUS | Status: AC
  Administered 2012-07-10: 400 mg via INTRAVENOUS

## 2012-07-10 MED ORDER — CHLORHEXIDINE GLUCONATE 4 % EX LIQD
1.0000 "application " | Freq: Once | CUTANEOUS | Status: DC
  Filled 2012-07-10: qty 15

## 2012-07-10 MED ORDER — MIDAZOLAM HCL 5 MG/5ML IJ SOLN
INTRAMUSCULAR | Status: DC | PRN
  Administered 2012-07-10: 2 mg via INTRAVENOUS

## 2012-07-10 MED ORDER — BUPIVACAINE-EPINEPHRINE 0.25% -1:200000 IJ SOLN
INTRAMUSCULAR | Status: DC | PRN
  Administered 2012-07-10: 50 mL

## 2012-07-10 MED ORDER — LIDOCAINE HCL (PF) 1 % IJ SOLN
INTRAMUSCULAR | Status: DC | PRN
  Administered 2012-07-10: 20 mL

## 2012-07-10 MED ORDER — LACTATED RINGERS IV SOLN
INTRAVENOUS | Status: DC | PRN
  Administered 2012-07-10: 08:00:00 via INTRAVENOUS

## 2012-07-10 MED ORDER — PROPOFOL 10 MG/ML IV BOLUS
INTRAVENOUS | Status: DC | PRN
  Administered 2012-07-10: 25 mg via INTRAVENOUS
  Administered 2012-07-10: 75 mg via INTRAVENOUS
  Administered 2012-07-10: 50 mg via INTRAVENOUS
  Administered 2012-07-10 (×2): 25 mg via INTRAVENOUS

## 2012-07-10 MED ORDER — FENTANYL CITRATE 0.05 MG/ML IJ SOLN
INTRAMUSCULAR | Status: DC | PRN
  Administered 2012-07-10: 50 ug via INTRAVENOUS

## 2012-07-10 MED ORDER — OXYCODONE-ACETAMINOPHEN 5-325 MG PO TABS: 1.0000 | ORAL_TABLET | ORAL | Status: DC | PRN

## 2012-07-10 MED ORDER — BUPIVACAINE-EPINEPHRINE 0.25% -1:200000 IJ SOLN
INTRAMUSCULAR | Status: AC
  Filled 2012-07-10: qty 1

## 2012-07-10 MED ORDER — 0.9 % SODIUM CHLORIDE (POUR BTL) OPTIME
TOPICAL | Status: DC | PRN
  Administered 2012-07-10: 1000 mL

## 2012-07-10 MED ORDER — CIPROFLOXACIN IN D5W 400 MG/200ML IV SOLN
INTRAVENOUS | Status: AC
  Filled 2012-07-10: qty 200

## 2012-07-10 MED ORDER — LIDOCAINE HCL 1 % IJ SOLN
INTRAMUSCULAR | Status: AC
  Filled 2012-07-10: qty 40

## 2012-07-10 SURGICAL SUPPLY — 47 items
ADH SKN CLS APL DERMABOND .7 (GAUZE/BANDAGES/DRESSINGS)
APL SKNCLS STERI-STRIP NONHPOA (GAUZE/BANDAGES/DRESSINGS) ×2
BANDAGE ELASTIC 6 VELCRO ST LF (GAUZE/BANDAGES/DRESSINGS) IMPLANT
BENZOIN TINCTURE PRP APPL 2/3 (GAUZE/BANDAGES/DRESSINGS) ×3 IMPLANT
BLADE HEX COATED 2.75 (ELECTRODE) ×3 IMPLANT
BLADE SURG 15 STRL LF DISP TIS (BLADE) ×2 IMPLANT
BLADE SURG 15 STRL SS (BLADE) ×3
CANISTER SUCTION 2500CC (MISCELLANEOUS) ×3 IMPLANT
CLOTH BEACON ORANGE TIMEOUT ST (SAFETY) ×3 IMPLANT
CLSR STERI-STRIP ANTIMIC 1/2X4 (GAUZE/BANDAGES/DRESSINGS) ×2 IMPLANT
DECANTER SPIKE VIAL GLASS SM (MISCELLANEOUS) ×3 IMPLANT
DERMABOND ADVANCED (GAUZE/BANDAGES/DRESSINGS)
DERMABOND ADVANCED .7 DNX12 (GAUZE/BANDAGES/DRESSINGS) IMPLANT
DRAPE LAPAROTOMY T 102X78X121 (DRAPES) IMPLANT
DRAPE LAPAROTOMY TRNSV 102X78 (DRAPE) IMPLANT
DRAPE PED LAPAROTOMY (DRAPES) ×2 IMPLANT
DRSG TEGADERM 4X4.75 (GAUZE/BANDAGES/DRESSINGS) ×4 IMPLANT
ELECT COATED BLADE 2.86 ST (ELECTRODE) IMPLANT
ELECT REM PT RETURN 9FT ADLT (ELECTROSURGICAL) ×3
ELECTRODE REM PT RTRN 9FT ADLT (ELECTROSURGICAL) ×2 IMPLANT
GAUZE SPONGE 4X4 16PLY XRAY LF (GAUZE/BANDAGES/DRESSINGS) ×2 IMPLANT
GLOVE BIO SURGEON STRL SZ 6 (GLOVE) ×3 IMPLANT
GLOVE BIO SURGEON STRL SZ 6.5 (GLOVE) IMPLANT
GLOVE BIOGEL PI IND STRL 6.5 (GLOVE) ×2 IMPLANT
GLOVE BIOGEL PI IND STRL 7.0 (GLOVE) ×2 IMPLANT
GLOVE BIOGEL PI INDICATOR 6.5 (GLOVE) ×1
GLOVE BIOGEL PI INDICATOR 7.0 (GLOVE) ×1
GLOVE INDICATOR 6.5 STRL GRN (GLOVE) ×6 IMPLANT
GOWN PREVENTION PLUS XXLARGE (GOWN DISPOSABLE) ×3 IMPLANT
KIT BASIN OR (CUSTOM PROCEDURE TRAY) ×3 IMPLANT
NDL HYPO 25X1 1.5 SAFETY (NEEDLE) ×1 IMPLANT
NEEDLE HYPO 22GX1.5 SAFETY (NEEDLE) ×2 IMPLANT
NEEDLE HYPO 25X1 1.5 SAFETY (NEEDLE) ×3 IMPLANT
PACK BASIC VI WITH GOWN DISP (CUSTOM PROCEDURE TRAY) ×3 IMPLANT
PEN SKIN MARKING BROAD (MISCELLANEOUS) ×3 IMPLANT
PENCIL BUTTON HOLSTER BLD 10FT (ELECTRODE) ×3 IMPLANT
SPONGE GAUZE 4X4 12PLY (GAUZE/BANDAGES/DRESSINGS) ×1 IMPLANT
SPONGE LAP 4X18 X RAY DECT (DISPOSABLE) ×2 IMPLANT
STRIP CLOSURE SKIN 1/2X4 (GAUZE/BANDAGES/DRESSINGS) ×3 IMPLANT
SUT MNCRL AB 4-0 PS2 18 (SUTURE) ×3 IMPLANT
SUT SILK 2 0 SH (SUTURE) ×2 IMPLANT
SUT VIC AB 3-0 SH 27 (SUTURE) ×3
SUT VIC AB 3-0 SH 27X BRD (SUTURE) ×1 IMPLANT
SYR BULB IRRIGATION 50ML (SYRINGE) ×3 IMPLANT
SYR CONTROL 10ML LL (SYRINGE) ×3 IMPLANT
TOWEL OR 17X26 10 PK STRL BLUE (TOWEL DISPOSABLE) ×3 IMPLANT
YANKAUER SUCT BULB TIP 10FT TU (MISCELLANEOUS) ×3 IMPLANT

## 2012-07-10 NOTE — H&P (View-Only) (Signed)
Chief Complaint  Patient presents with  . Follow-up    melonma  upper back    HISTORY: The patient is a 51 year old female who was diagnosed with melanoma in situ at Dr. Dorita Sciara office. She was found to have a concerning lesion on her back by her husband. He did not recall lesion prior to 4 months ago. He noticed it started becoming darker and irregular.  She brought this to the attention of Dr. Laury Axon.  She was referred to dermatology for evaluation.  She does state that that area it reached, but she is not sure there was lesion itself that was itching. She does have dry skin and this is commonly a problem. The lesion did not have an ulcer. She denied any family history of melanoma. She has not had a skin cancer previously. Her husband has been concerned about additional lesion on her back. He states it is on has become more raised and darker since he had previously recognized it.  Past Medical History  Diagnosis Date  . Anxiety   . Hypertension   . Hyperlipidemia     Past surgical history:   Gallbladder  Oral surgery  Current Outpatient Prescriptions  Medication Sig Dispense Refill  . aspirin 81 MG EC tablet Take 81 mg by mouth daily.        Jae Dire Bandages & Supports (MEDICAL COMPRESSION STOCKINGS) MISC 1 Device by Does not apply route daily.        . fish oil-omega-3 fatty acids 1000 MG capsule Take 1 capsule (1 g total) by mouth daily.  120 capsule  11  . LORazepam (ATIVAN) 0.5 MG tablet Take 1 tablet (0.5 mg total) by mouth 3 (three) times daily. as needed  90 tablet  0  . lovastatin (MEVACOR) 40 MG tablet Take 1 tablet (40 mg total) by mouth at bedtime.  90 tablet  0  . Multiple Vitamin (MULTIVITAMIN) tablet Take 1 tablet by mouth daily.        Marland Kitchen nystatin (NYSTOP) 100000 UNIT/GM POWD APPLY THREE (3) TIMES DAILY  60 g  0  . tolterodine (DETROL LA) 2 MG 24 hr capsule Take 1 capsule (2 mg total) by mouth daily.  90 capsule  3  . valsartan-hydrochlorothiazide (DIOVAN HCT) 320-25  MG per tablet Take 1 tablet by mouth daily.  30 tablet  5  . albuterol (PROVENTIL HFA;VENTOLIN HFA) 108 (90 BASE) MCG/ACT inhaler Inhale 2 puffs into the lungs every 6 (six) hours as needed for wheezing.  1 Inhaler  0     Allergies  Allergen Reactions  . Codeine     Nausea & vomiting  . Penicillins     angioedema     Family History  Problem Relation Age of Onset  . Coronary artery disease  40    1st degree female  . Hypertension Father   . COPD Father   . Heart disease Father 81    MI  . Cancer Father 90    prostate ca     History   Social History  . Marital Status: Married    Spouse Name: N/A    Number of Children: N/A  . 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 Smoking status: Never Smoker   . Smokeless tobacco: None  . Alcohol Use: No  . Drug Use: No  . Sexually Active: Yes -- Female partner(s)   Other Topics Concern  .  None   Social History Narrative   Exercise-- zumba 4-5 x a week in summer,  2-3 x during school      REVIEW OF SYSTEMS - PERTINENT POSITIVES ONLY: 12 point review of systems negative other than HPI and PMH   EXAM: Filed Vitals:   06/18/12 1152  BP: 136/80  Pulse: 66  Temp: 96.1 F (35.6 C)  Resp: 18    Gen:  No acute distress.  Well nourished and well groomed.   Neurological: Alert and oriented to person, place, and time. Coordination normal.  Head: Normocephalic and atraumatic.  Eyes: Conjunctivae are normal. Pupils are equal, round, and reactive to light. No scleral icterus.  Neck: Normal range of motion. Neck supple. No tracheal deviation or thyromegaly present.  Cardiovascular: Normal rate, regular rhythm, normal heart sounds and intact distal pulses.  Exam reveals no gallop and no friction rub.  No murmur heard. Respiratory: Effort normal.  No respiratory distress. No chest wall tenderness. Breath sounds normal.  No wheezes, rales or rhonchi.  GI:  Soft. Bowel sounds are normal. The abdomen is soft and nontender.  There is no rebound and no guarding.  Musculoskeletal: Normal range of motion. Extremities are nontender.  Lymphadenopathy: No cervical, preauricular, postauricular or axillary adenopathy is present Skin: Skin is warm and dry. No rash noted. No diaphoresis. No erythema. No pallor. No clubbing, cyanosis, or edema.  Biopsy site on back scabbed over.  No adjacent pigmentation.   Nearby mole looks like seborrheic keratosis.   Psychiatric: Normal mood and affect. Behavior is normal. Judgment and thought content normal.     RADIOLOGY RESULTS: See E-Chart or I-Site for most recent results.  Images and reports are reviewed. Diagnosis T Skin , (A) center of back, shave removal O MELANOMA IN SITU WITH REGRESSION   ASSESSMENT AND PLAN: Melanoma in situ of back Wide local excision and advancement flap closure of back melanoma in situ.  Discussed risks of surgery including bleeding, infection, damage to advancement flap closure. Numbness will definitely occur.    Discussed reason for surgery (decrease risk of recurrence)  Will also biopsy nearby nevus.       Maudry Diego MD Surgical Oncology, General and Endocrine Surgery [facility]      Visit Diagnoses: 1. Melanoma in situ of back     Primary Care Physician: Loreen Freud, DO

## 2012-07-10 NOTE — Interval H&P Note (Signed)
History and Physical Interval Note:  07/10/2012 8:45 AM  Pam Phillips  has presented today for surgery, with the diagnosis of back melanoma in situ  The various methods of treatment have been discussed with the patient and family. After consideration of risks, benefits and other options for treatment, the patient has consented to  Procedure(s) (LRB) with comments: MELANOMA EXCISION (N/A) - Wide Local Excision and Advancement Flap Closure, Punch Biopsy BIOPSY SKIN (N/A) as a surgical intervention .  The patient's history has been reviewed, patient examined, no change in status, stable for surgery.  I have reviewed the patient's chart and labs.  Questions were answered to the patient's satisfaction.     Cherly Erno

## 2012-07-10 NOTE — Transfer of Care (Signed)
Immediate Anesthesia Transfer of Care Note  Patient: Pam Phillips  Procedure(s) Performed: Procedure(s) (LRB) with comments: MELANOMA EXCISION (N/A) - Wide Local Excision and Advancement Flap Closure, Punch Biopsy BIOPSY SKIN (N/A)  Patient Location: PACU  Anesthesia Type: MAC  Level of Consciousness: awake, alert , oriented and patient cooperative  Airway & Oxygen Therapy: Patient Spontanous Breathing and Patient connected to face mask oxygen  Post-op Assessment: Report given to PACU RN and Post -op Vital signs reviewed and stable  Post vital signs: Reviewed and stable  Complications: No apparent anesthesia complications

## 2012-07-10 NOTE — Op Note (Signed)
PRE-OPERATIVE DIAGNOSIS: melanoma in situ right back  POST-OPERATIVE DIAGNOSIS:  Same  PROCEDURE:  Procedure(s): Wide local excision right back melanoma in situ with 5 mm margin and layered advancement flap closure.  Defect 2.5 x 4 cm  SURGEON:  Surgeon(s): Almond Lint, MD  ANESTHESIA:   local and MAC  DRAINS: none   LOCAL MEDICATIONS USED:  MARCAINE    and LIDOCAINE   SPECIMEN:  Source of Specimen:  back melanom in situ  DISPOSITION OF SPECIMEN:  PATHOLOGY  COUNTS:  YES  PLAN OF CARE: Discharge to home after PACU  PATIENT DISPOSITION:  PACU - hemodynamically stable.   PROCEDURE:    Patient was identified in the holding area and taken to the operating room where she was placed supine on the operating room table. MAC anesthesia was induced. She was then placed into the lateral position. This was on a beanbag. Her right back melanoma site was prepped with chlor prep.  The patient was prepped and draped in sterile fashion. Timeout was performed according to the surgical safety checklist. When all was correct, we continued.  The melanoma biopsy site was marked with a 5 mm margin. This was turned into an elliptical incision in order to bring the wound together without dog ears. The area was infiltrated with local anesthetic. The skin was tested to make sure adequate anesthesia had been administered.  The lesion was excised with a #10 blade down to the fascia. This was marked with silk suture with short superior and long lateral. The lesion was elevated with an Allis clamp and the cautery was used to take this off of the fascia. The lesion was passed off the table.  The skin edges were elevated and undermined with the cautery. The skin edges were brought together with the penetrating towel clips. Interrupted 2-0 Vicryl deep dermal sutures were used to relieve tension. Additional 3-0 Vicryl sutures were placed in the deep dermal layer. The skin was reapproximated with 4-0 Monocryl in  running subcuticular fashion.  The wound was cleaned and dried. It was then dressed with benzoin, Steri-Strips, gauze, and Tegaderm. The patient was allowed to emerge from MAC anesthesia and was taken to the PACU in stable condition. Needle sponge and instrument counts were correct.

## 2012-07-10 NOTE — Anesthesia Preprocedure Evaluation (Addendum)
Anesthesia Evaluation  Patient identified by MRN, date of birth, ID band Patient awake    Reviewed: Allergy & Precautions, H&P , NPO status , Patient's Chart, lab work & pertinent test results  Airway Mallampati: II TM Distance: >3 FB Neck ROM: Full    Dental No notable dental hx.    Pulmonary neg pulmonary ROS,  breath sounds clear to auscultation  Pulmonary exam normal       Cardiovascular hypertension, Pt. on medications Rhythm:Regular Rate:Normal     Neuro/Psych negative neurological ROS  negative psych ROS   GI/Hepatic negative GI ROS, Neg liver ROS,   Endo/Other  Morbid obesity  Renal/GU negative Renal ROS  negative genitourinary   Musculoskeletal negative musculoskeletal ROS (+)   Abdominal   Peds negative pediatric ROS (+)  Hematology negative hematology ROS (+)   Anesthesia Other Findings   Reproductive/Obstetrics negative OB ROS                          Anesthesia Physical Anesthesia Plan  ASA: II  Anesthesia Plan: MAC   Post-op Pain Management:    Induction: Intravenous  Airway Management Planned: Simple Face Mask  Additional Equipment:   Intra-op Plan:   Post-operative Plan:   Informed Consent: I have reviewed the patients History and Physical, chart, labs and discussed the procedure including the risks, benefits and alternatives for the proposed anesthesia with the patient or authorized representative who has indicated his/her understanding and acceptance.   Dental advisory given  Plan Discussed with: CRNA  Anesthesia Plan Comments:         Anesthesia Quick Evaluation

## 2012-07-10 NOTE — Anesthesia Postprocedure Evaluation (Signed)
  Anesthesia Post-op Note  Patient: Pam Phillips  Procedure(s) Performed: Procedure(s) (LRB): MELANOMA EXCISION (N/A)  Patient Location: PACU  Anesthesia Type: MAC  Level of Consciousness: awake and alert   Airway and Oxygen Therapy: Patient Spontanous Breathing  Post-op Pain: mild  Post-op Assessment: Post-op Vital signs reviewed, Patient's Cardiovascular Status Stable, Respiratory Function Stable, Patent Airway and No signs of Nausea or vomiting  Post-op Vital Signs: stable  Complications: No apparent anesthesia complications

## 2012-07-11 ENCOUNTER — Encounter (HOSPITAL_COMMUNITY): Payer: Self-pay | Admitting: General Surgery

## 2012-07-11 ENCOUNTER — Telehealth (INDEPENDENT_AMBULATORY_CARE_PROVIDER_SITE_OTHER): Payer: Self-pay

## 2012-07-11 NOTE — Telephone Encounter (Signed)
Pt given pathology results.  F/u appt confirmed.

## 2012-07-12 ENCOUNTER — Telehealth: Payer: Self-pay | Admitting: Family Medicine

## 2012-07-12 DIAGNOSIS — F419 Anxiety disorder, unspecified: Secondary | ICD-10-CM

## 2012-07-12 MED ORDER — LORAZEPAM 0.5 MG PO TABS: 0.5000 mg | ORAL_TABLET | Freq: Three times a day (TID) | ORAL | Status: DC

## 2012-07-12 NOTE — Telephone Encounter (Signed)
Refill done.  

## 2012-07-12 NOTE — Telephone Encounter (Signed)
Refill x1 

## 2012-07-12 NOTE — Telephone Encounter (Signed)
Last seen 05/06/12 and filled 06/11/12 #90. Please advise      KP

## 2012-07-12 NOTE — Telephone Encounter (Signed)
Refill: Lorazepam 0.5 mg tab. Last fill 06-11-12

## 2012-07-17 ENCOUNTER — Other Ambulatory Visit: Payer: Self-pay | Admitting: Family Medicine

## 2012-07-17 ENCOUNTER — Encounter: Payer: Self-pay | Admitting: Family Medicine

## 2012-07-17 MED ORDER — TOLTERODINE TARTRATE 2 MG PO TABS: 2.0000 mg | ORAL_TABLET | Freq: Two times a day (BID) | ORAL | Status: DC

## 2012-07-19 ENCOUNTER — Encounter (INDEPENDENT_AMBULATORY_CARE_PROVIDER_SITE_OTHER): Payer: Self-pay | Admitting: General Surgery

## 2012-07-19 ENCOUNTER — Ambulatory Visit (INDEPENDENT_AMBULATORY_CARE_PROVIDER_SITE_OTHER): Payer: BC Managed Care – PPO | Admitting: General Surgery

## 2012-07-19 VITALS — BP 136/74 | HR 64 | Temp 97.2°F | Resp 16 | Ht 68.5 in | Wt 360.2 lb

## 2012-07-19 DIAGNOSIS — D0359 Melanoma in situ of other part of trunk: Secondary | ICD-10-CM

## 2012-07-19 DIAGNOSIS — C4359 Malignant melanoma of other part of trunk: Secondary | ICD-10-CM

## 2012-07-19 NOTE — Progress Notes (Signed)
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 She did not need any narcotics. She has a small bit of numbness but no pain. She has had no wound drainage. She is doing well.   EXAM: General:  Alert and oriented Incision:  Well healed.     PATHOLOGY: Skin , back melanoma - NO RESIDUAL IN SITU OR INVASIVE MELANOMA. - PREVIOUS BIOPSY SITE IDENTIFIED. - PERIPHERAL AND DEEP EXCISIONAL MARGINS, NEGATIVE FOR ATYPIA OR MALIGNANCY.   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 I will see her back in 3 months for a complete skin survey.  Reviewed that her restrictions are over.  Advised of risk of other melanomas.      Maudry Diego, MD Surgical Oncology, General & Endocrine Surgery [facility]  Loreen Freud, DO Lelon Perla, DO

## 2012-07-19 NOTE — Assessment & Plan Note (Signed)
No evidence surgical complications. I will see her back in 3 months for a complete skin survey.  Reviewed that her restrictions are over.  Advised of risk of other melanomas.

## 2012-07-19 NOTE — Patient Instructions (Addendum)
Follow up in 3 months.  Free from restrictions.

## 2012-07-22 ENCOUNTER — Ambulatory Visit: Payer: BC Managed Care – PPO | Admitting: Family Medicine

## 2012-07-31 ENCOUNTER — Other Ambulatory Visit: Payer: BC Managed Care – PPO

## 2012-08-13 ENCOUNTER — Telehealth: Payer: Self-pay | Admitting: Family Medicine

## 2012-08-13 DIAGNOSIS — F419 Anxiety disorder, unspecified: Secondary | ICD-10-CM

## 2012-08-13 MED ORDER — LORAZEPAM 0.5 MG PO TABS: 0.5000 mg | ORAL_TABLET | Freq: Three times a day (TID) | ORAL | Status: DC

## 2012-08-13 NOTE — Telephone Encounter (Signed)
Refill x1 

## 2012-08-13 NOTE — Telephone Encounter (Signed)
Refill: Lorazepam 0.5mg  tab. Last fill 07-12-12

## 2012-08-13 NOTE — Telephone Encounter (Signed)
Last seen 05/06/12 and filled 07/12/12 #90. Please advise     KP

## 2012-08-14 ENCOUNTER — Other Ambulatory Visit (INDEPENDENT_AMBULATORY_CARE_PROVIDER_SITE_OTHER): Payer: BC Managed Care – PPO

## 2012-08-14 DIAGNOSIS — R7309 Other abnormal glucose: Secondary | ICD-10-CM

## 2012-08-14 DIAGNOSIS — R739 Hyperglycemia, unspecified: Secondary | ICD-10-CM

## 2012-08-14 DIAGNOSIS — I10 Essential (primary) hypertension: Secondary | ICD-10-CM

## 2012-08-14 LAB — CBC WITH DIFFERENTIAL/PLATELET
Basophils Absolute: 0 10*3/uL (ref 0.0–0.1)
Eosinophils Relative: 2.3 % (ref 0.0–5.0)
HCT: 36.2 % (ref 36.0–46.0)
Lymphocytes Relative: 25.2 % (ref 12.0–46.0)
Monocytes Relative: 5.3 % (ref 3.0–12.0)
Neutrophils Relative %: 66.8 % (ref 43.0–77.0)
Platelets: 214 10*3/uL (ref 150.0–400.0)
RDW: 13 % (ref 11.5–14.6)
WBC: 9 10*3/uL (ref 4.5–10.5)

## 2012-08-14 LAB — BASIC METABOLIC PANEL
BUN: 14 mg/dL (ref 6–23)
Calcium: 9 mg/dL (ref 8.4–10.5)
GFR: 96.51 mL/min (ref >60.00)
Glucose, Bld: 106 mg/dL — ABNORMAL HIGH (ref 70–99)
Potassium: 4.1 mEq/L (ref 3.5–5.1)

## 2012-08-14 LAB — LIPID PANEL
Cholesterol: 155 mg/dL (ref 0–200)
HDL: 44.6 mg/dL (ref >39.00)
VLDL: 21.6 mg/dL (ref 0.0–40.0)

## 2012-08-14 LAB — TSH: TSH: 1.35 u[IU]/mL (ref 0.35–5.50)

## 2012-08-14 LAB — HEPATIC FUNCTION PANEL
ALT: 21 U/L (ref 0–35)
AST: 22 U/L (ref 0–37)
Bilirubin, Direct: 0.2 mg/dL (ref 0.0–0.3)
Total Bilirubin: 0.8 mg/dL (ref 0.3–1.2)
Total Protein: 7.1 g/dL (ref 6.0–8.3)

## 2012-08-14 LAB — HEMOGLOBIN A1C: Hgb A1c MFr Bld: 5.5 % (ref 4.6–6.5)

## 2012-08-14 NOTE — Telephone Encounter (Signed)
[facility] called stating they have not received this rx.

## 2012-08-14 NOTE — Telephone Encounter (Signed)
I called pharmacy and checked with Tech, rx not received. I left rx with Tech as ok'd by MD  X 1

## 2012-08-20 ENCOUNTER — Encounter: Payer: Self-pay | Admitting: Family Medicine

## 2012-08-20 ENCOUNTER — Ambulatory Visit (INDEPENDENT_AMBULATORY_CARE_PROVIDER_SITE_OTHER): Payer: BC Managed Care – PPO | Admitting: Family Medicine

## 2012-08-20 VITALS — BP 134/76 | HR 72 | Temp 98.0°F | Wt 362.0 lb

## 2012-08-20 DIAGNOSIS — I10 Essential (primary) hypertension: Secondary | ICD-10-CM

## 2012-08-20 DIAGNOSIS — Z0000 Encounter for general adult medical examination without abnormal findings: Secondary | ICD-10-CM

## 2012-08-20 DIAGNOSIS — Z23 Encounter for immunization: Secondary | ICD-10-CM

## 2012-08-20 DIAGNOSIS — E785 Hyperlipidemia, unspecified: Secondary | ICD-10-CM

## 2012-08-20 NOTE — Patient Instructions (Addendum)

## 2012-08-20 NOTE — Progress Notes (Signed)
  Subjective:    Patient ID: Pam Phillips, female    DOB: June 25, 1961, 51 y.o.   MRN: [id number]  HPI Pt here for f/u.  She is doing well after surgery for melanoma on back.    Review of Systems     Objective:   Physical Exam        Assessment & Plan:   Subjective:    Patient here for follow-up of elevated blood pressure.  She is exercising and is adherent to a low-salt diet.  Blood pressure is well controlled at home. Cardiac symptoms: none. Patient denies: chest pain, chest pressure/discomfort, claudication, dyspnea, exertional chest pressure/discomfort, fatigue, irregular heart beat, lower extremity edema, near-syncope, orthopnea, palpitations, paroxysmal nocturnal dyspnea, syncope and tachypnea. Cardiovascular risk factors: dyslipidemia, hypertension and obesity (BMI >= 30 kg/m2). Use of agents associated with hypertension: none. History of target organ damage: none.  The following portions of the patient's history were reviewed and updated as appropriate: allergies, current medications, past family history, past medical history, past social history, past surgical history and problem list.  Review of Systems Pertinent items are noted in HPI.     Objective:    BP 134/76  Pulse 72  Temp 98 F (36.7 C) (Oral)  Wt 362 lb (164.202 kg)  SpO2 99% General appearance: alert, cooperative, appears stated age and no distress Neck: no adenopathy, supple, symmetrical, trachea midline and thyroid not enlarged, symmetric, no tenderness/mass/nodules Lungs: clear to auscultation bilaterally Heart: regular rate and rhythm, S1, S2 normal, no murmur, click, rub or gallop Extremities: extremities normal, atraumatic, no cyanosis or edema    Assessment:    Hypertension, normal blood pressure . Evidence of target organ damage: none.   hyperlipidemia---con't meds  hyperglycemia---con't diet and exercise   Plan:  Labs reviewed  Medication: no change. Dietary sodium restriction. Regular  aerobic exercise. Check blood pressures 2-3 times daily and record. Follow up: 6 months and as needed.

## 2012-08-28 ENCOUNTER — Other Ambulatory Visit: Payer: Self-pay | Admitting: Family Medicine

## 2012-10-19 ENCOUNTER — Other Ambulatory Visit: Payer: Self-pay | Admitting: Family Medicine

## 2012-10-21 ENCOUNTER — Other Ambulatory Visit: Payer: Self-pay | Admitting: Family Medicine

## 2012-10-21 DIAGNOSIS — F419 Anxiety disorder, unspecified: Secondary | ICD-10-CM

## 2012-10-21 MED ORDER — LORAZEPAM 0.5 MG PO TABS: 0.5000 mg | ORAL_TABLET | Freq: Three times a day (TID) | ORAL | Status: DC

## 2012-10-21 NOTE — Telephone Encounter (Signed)
reflll x1

## 2012-10-21 NOTE — Telephone Encounter (Signed)
Refill x1 

## 2012-10-21 NOTE — Telephone Encounter (Signed)
refill LORazepam (Tab) 0.5 MG Take 1 tablet (0.5 mg total) by mouth 3 (three) times daily. as needed  no qty listed last fill 12.3.13

## 2012-10-21 NOTE — Telephone Encounter (Signed)
Last seen 08/20/12 filled 09/16/12 # 90. Please advise      KP

## 2012-11-07 ENCOUNTER — Ambulatory Visit: Payer: BC Managed Care – PPO | Admitting: Family Medicine

## 2012-11-12 ENCOUNTER — Ambulatory Visit (INDEPENDENT_AMBULATORY_CARE_PROVIDER_SITE_OTHER): Payer: BC Managed Care – PPO | Admitting: General Surgery

## 2012-11-12 ENCOUNTER — Encounter (INDEPENDENT_AMBULATORY_CARE_PROVIDER_SITE_OTHER): Payer: Self-pay | Admitting: General Surgery

## 2012-11-12 VITALS — BP 130/72 | HR 74 | Temp 98.1°F | Resp 18 | Ht 68.5 in | Wt 361.0 lb

## 2012-11-12 DIAGNOSIS — D0359 Melanoma in situ of other part of trunk: Secondary | ICD-10-CM

## 2012-11-12 DIAGNOSIS — C4359 Malignant melanoma of other part of trunk: Secondary | ICD-10-CM

## 2012-11-12 NOTE — Progress Notes (Signed)
HISTORY: Patient is approximately 4 months status post excision of melanoma in situ of the back. She has not noticed any new pigmented moles. She does have a few scaly lesions on her inner thigh on the left. These have not changed. She denies any other changes in her health habits. She denies any change in her bowel function.   PERTINENT REVIEW OF SYSTEMS: Otherwise negative x11   EXAM: Head: Normocephalic and atraumatic.  Eyes:  Conjunctivae are normal. Pupils are equal, round, and reactive to light. No scleral icterus.  Neck:  Normal range of motion. Neck supple. No tracheal deviation present. No thyromegaly present.  Resp: No respiratory distress, normal effort. Abd:  Abdomen is soft, non distended and non tender. No masses are palpable.  There is no rebound and no guarding.  Neurological: Alert and oriented to person, place, and time. Coordination normal.  Skin: Skin is warm and dry. No rash noted. No diaphoretic. No erythema. No pallor. Full skin survey done.  Multiple small nevi on back that are unchanged.  None are deeply pigmented or irregular.  2 seborrheic keratoses on left inner thigh.   Psychiatric: Normal mood and affect. Normal behavior. Judgment and thought content normal.      ASSESSMENT AND PLAN:   Melanoma in situ of back No evidence of recurrent melanoma in situ in the back or on skin survey.  Followup with me in one year. I recommend that she followup with her dermatologist in 6 months for skin survey.      Maudry Diego, MD Surgical Oncology, General & Endocrine Surgery [facility]  Loreen Freud, DO Lelon Perla, DO

## 2012-11-12 NOTE — Assessment & Plan Note (Signed)
No evidence of recurrent melanoma in situ in the back or on skin survey.  Followup with me in one year. I recommend that she followup with her dermatologist in 6 months for skin survey.

## 2012-11-12 NOTE — Patient Instructions (Signed)
Follow up with dermatology in 6 months.  Follow up with me in 1 year.

## 2012-11-25 ENCOUNTER — Telehealth: Payer: Self-pay | Admitting: Family Medicine

## 2012-11-25 DIAGNOSIS — F419 Anxiety disorder, unspecified: Secondary | ICD-10-CM

## 2012-11-25 MED ORDER — LORAZEPAM 0.5 MG PO TABS: 0.5000 mg | ORAL_TABLET | Freq: Three times a day (TID) | ORAL | Status: DC

## 2012-11-25 NOTE — Telephone Encounter (Signed)
Refill meds

## 2012-11-25 NOTE — Telephone Encounter (Signed)
Last seen 08/20/12 and 10/21/12 # 90. Please advise     KP

## 2012-11-25 NOTE — Telephone Encounter (Signed)
Refill: Lorazepam 0.5 mg tab. Last fill 10-21-12

## 2012-12-24 ENCOUNTER — Encounter: Payer: Self-pay | Admitting: Family Medicine

## 2012-12-24 DIAGNOSIS — F419 Anxiety disorder, unspecified: Secondary | ICD-10-CM

## 2012-12-25 ENCOUNTER — Encounter: Payer: Self-pay | Admitting: Family Medicine

## 2012-12-25 MED ORDER — LORAZEPAM 0.5 MG PO TABS: 0.5000 mg | ORAL_TABLET | Freq: Three times a day (TID) | ORAL | Status: DC

## 2012-12-25 NOTE — Telephone Encounter (Signed)
Last seen 08/20/12 and filled 11/25/12 #90. Please advise     KP

## 2012-12-25 NOTE — Telephone Encounter (Signed)
Are you approving this?

## 2012-12-30 ENCOUNTER — Other Ambulatory Visit: Payer: Self-pay | Admitting: Family Medicine

## 2013-01-25 ENCOUNTER — Other Ambulatory Visit: Payer: Self-pay | Admitting: Family Medicine

## 2013-01-29 ENCOUNTER — Encounter: Payer: Self-pay | Admitting: Family Medicine

## 2013-01-29 ENCOUNTER — Other Ambulatory Visit: Payer: Self-pay

## 2013-01-29 DIAGNOSIS — F419 Anxiety disorder, unspecified: Secondary | ICD-10-CM

## 2013-01-29 MED ORDER — LORAZEPAM 0.5 MG PO TABS: 0.5000 mg | ORAL_TABLET | Freq: Three times a day (TID) | ORAL | Status: DC

## 2013-02-14 ENCOUNTER — Encounter: Payer: Self-pay | Admitting: Lab

## 2013-02-17 ENCOUNTER — Ambulatory Visit (INDEPENDENT_AMBULATORY_CARE_PROVIDER_SITE_OTHER): Payer: BC Managed Care – PPO | Admitting: Family Medicine

## 2013-02-17 ENCOUNTER — Encounter: Payer: Self-pay | Admitting: Family Medicine

## 2013-02-17 VITALS — BP 122/74 | HR 72 | Temp 98.5°F | Wt 366.0 lb

## 2013-02-17 DIAGNOSIS — E785 Hyperlipidemia, unspecified: Secondary | ICD-10-CM

## 2013-02-17 DIAGNOSIS — I10 Essential (primary) hypertension: Secondary | ICD-10-CM

## 2013-02-17 NOTE — Assessment & Plan Note (Signed)
Stable con't meds 

## 2013-02-17 NOTE — Progress Notes (Signed)
  Subjective:    Patient here for follow-up of elevated blood pressure.  She is exercising and is adherent to a low-salt diet.  Blood pressure is not checked at home. Cardiac symptoms: none. Patient denies: chest pain, chest pressure/discomfort, claudication, dyspnea, exertional chest pressure/discomfort, fatigue, irregular heart beat, lower extremity edema, near-syncope, orthopnea, palpitations, paroxysmal nocturnal dyspnea, syncope and tachypnea. Cardiovascular risk factors: dyslipidemia and obesity (BMI >= 30 kg/m2). Use of agents associated with hypertension: none. History of target organ damage: none.  The following portions of the patient's history were reviewed and updated as appropriate: allergies, current medications, past family history, past medical history, past social history, past surgical history and problem list.  Review of Systems Pertinent items are noted in HPI.     Objective:    BP 122/74  Pulse 72  Temp(Src) 98.5 F (36.9 C) (Oral)  Wt 366 lb (166.017 kg)  BMI 54.83 kg/m2  SpO2 97% General appearance: alert, cooperative, appears stated age and no distress Neck: no adenopathy, supple, symmetrical, trachea midline and thyroid not enlarged, symmetric, no tenderness/mass/nodules Lungs: clear to auscultation bilaterally Heart: +S1S2  + murmur Extremities: edema +1 edema--no change    Assessment:    Hypertension, normal blood pressure . Evidence of target organ damage: none.    Plan:    Medication: no change. Dietary sodium restriction. Regular aerobic exercise. Check blood pressures 2-3 times weekly and record. Follow up: 6 months and as needed.

## 2013-02-17 NOTE — Assessment & Plan Note (Signed)
Check labs Con' meds 

## 2013-02-17 NOTE — Patient Instructions (Signed)
Hypertension Information As your heart beats, it forces blood through your arteries. This force is your blood pressure. If the pressure is too high, it is called hypertension (HTN) or high blood pressure. HTN is dangerous because you may have it and not know it. High blood pressure may mean that your heart has to work harder to pump blood. Your arteries may be narrow or stiff. The extra work puts you at risk for heart disease, stroke, and other problems.  Blood pressure consists of two numbers, a higher number over a lower, 110/72, for example. It is stated as "110 over 72." The ideal is below 120 for the top number (systolic) and under 80 for the bottom (diastolic).  You should pay close attention to your blood pressure if you have certain conditions such as:  Heart failure.   Prior heart attack.   Diabetes   Chronic kidney disease.   Prior stroke.   Multiple risk factors for heart disease.  To see if you have HTN, your blood pressure should be measured while you are seated with your arm held at the level of the heart. It should be measured at least twice. A one-time elevated blood pressure reading (especially in the Emergency Department) does not mean that you need treatment. There may be conditions in which the blood pressure is different between your right and left arms. It is important to see your caregiver soon for a recheck. Most people have essential hypertension which means that there is not a specific cause. This type of high blood pressure may be lowered by changing lifestyle factors such as:  Stress.   Smoking.   Lack of exercise.   Excessive weight.   Drug/tobacco/alcohol use.   Eating less salt.  Most people do not have symptoms from high blood pressure until it has caused damage to the body. Effective treatment can often prevent, delay or reduce that damage. TREATMENT  Treatment for high blood pressure, when a cause has been identified, is directed at the cause. There  are a large number of medications to treat HTN. These fall into several categories, and your caregiver will help you select the medicines that are best for you. Medications may have side effects. You should review side effects with your caregiver. If your blood pressure stays high after you have made lifestyle changes or started on medicines,   Your medication(s) may need to be changed.   Other problems may need to be addressed.   Be certain you understand your prescriptions, and know how and when to take your medicine.   Be sure to follow up with your caregiver within the time frame advised (usually within two weeks) to have your blood pressure rechecked and to review your medications.   If you are taking more than one medicine to lower your blood pressure, make sure you know how and at what times they should be taken. Taking two medicines at the same time can result in blood pressure that is too low.  Document Released: 12/05/2005 Document Revised: 06/14/2011 Document Reviewed: 12/12/2007 [employer] Patient Information 2012 [employer] 

## 2013-02-19 ENCOUNTER — Other Ambulatory Visit: Payer: BC Managed Care – PPO

## 2013-02-19 LAB — BASIC METABOLIC PANEL
GFR: 97.97 mL/min (ref >60.00)
Glucose, Bld: 93 mg/dL (ref 70–99)
Potassium: 3.7 mEq/L (ref 3.5–5.1)
Sodium: 135 mEq/L (ref 135–145)

## 2013-02-19 LAB — HEPATIC FUNCTION PANEL
AST: 19 U/L (ref 0–37)
Albumin: 3.8 g/dL (ref 3.5–5.2)
Alkaline Phosphatase: 55 U/L (ref 39–117)

## 2013-02-19 LAB — LIPID PANEL
LDL Cholesterol: 76 mg/dL (ref 0–99)
VLDL: 16.8 mg/dL (ref 0.0–40.0)

## 2013-02-19 NOTE — Addendum Note (Signed)
Addended by: Silvio Pate D on: 02/19/2013 08:30 AM   Modules accepted: Orders

## 2013-02-25 ENCOUNTER — Encounter: Payer: Self-pay | Admitting: Family Medicine

## 2013-02-26 ENCOUNTER — Ambulatory Visit (HOSPITAL_BASED_OUTPATIENT_CLINIC_OR_DEPARTMENT_OTHER): Payer: BC Managed Care – PPO

## 2013-03-03 ENCOUNTER — Encounter: Payer: Self-pay | Admitting: Family Medicine

## 2013-03-03 ENCOUNTER — Other Ambulatory Visit: Payer: Self-pay | Admitting: Family Medicine

## 2013-03-03 DIAGNOSIS — F419 Anxiety disorder, unspecified: Secondary | ICD-10-CM

## 2013-03-04 MED ORDER — LORAZEPAM 0.5 MG PO TABS: 0.5000 mg | ORAL_TABLET | Freq: Three times a day (TID) | ORAL | Status: DC

## 2013-03-04 NOTE — Telephone Encounter (Signed)
Last seen 02/17/13 and filled 01/29/13 #90. Please advise      KP

## 2013-03-05 ENCOUNTER — Ambulatory Visit (HOSPITAL_BASED_OUTPATIENT_CLINIC_OR_DEPARTMENT_OTHER)
Admission: RE | Admit: 2013-03-05 | Discharge: 2013-03-05 | Disposition: A | Discharge status: Home / Self Care | Payer: BC Managed Care – PPO | Source: Ambulatory Visit | Attending: Family Medicine | Admitting: Family Medicine

## 2013-03-05 ENCOUNTER — Other Ambulatory Visit: Payer: Self-pay | Admitting: Family Medicine

## 2013-03-05 DIAGNOSIS — F411 Generalized anxiety disorder: Secondary | ICD-10-CM | POA: Insufficient documentation

## 2013-03-05 DIAGNOSIS — Z1231 Encounter for screening mammogram for malignant neoplasm of breast: Secondary | ICD-10-CM

## 2013-03-05 DIAGNOSIS — R011 Cardiac murmur, unspecified: Secondary | ICD-10-CM | POA: Insufficient documentation

## 2013-03-05 DIAGNOSIS — R0989 Other specified symptoms and signs involving the circulatory and respiratory systems: Secondary | ICD-10-CM

## 2013-03-05 DIAGNOSIS — R0609 Other forms of dyspnea: Secondary | ICD-10-CM

## 2013-03-05 DIAGNOSIS — I517 Cardiomegaly: Secondary | ICD-10-CM

## 2013-03-05 DIAGNOSIS — E785 Hyperlipidemia, unspecified: Secondary | ICD-10-CM | POA: Insufficient documentation

## 2013-03-05 DIAGNOSIS — I10 Essential (primary) hypertension: Secondary | ICD-10-CM | POA: Insufficient documentation

## 2013-03-05 NOTE — Progress Notes (Signed)
  Echocardiogram 2D Echocardiogram has been performed.  Pam Phillips 03/05/2013, 9:46 AM

## 2013-03-06 ENCOUNTER — Encounter: Payer: Self-pay | Admitting: Family Medicine

## 2013-03-09 ENCOUNTER — Other Ambulatory Visit: Payer: Self-pay | Admitting: Family Medicine

## 2013-03-11 NOTE — Telephone Encounter (Signed)
Med filled.  

## 2013-03-12 ENCOUNTER — Other Ambulatory Visit: Payer: Self-pay | Admitting: Family Medicine

## 2013-03-22 ENCOUNTER — Other Ambulatory Visit: Payer: Self-pay | Admitting: Family Medicine

## 2013-04-07 ENCOUNTER — Other Ambulatory Visit: Payer: Self-pay | Admitting: Family Medicine

## 2013-04-07 DIAGNOSIS — F419 Anxiety disorder, unspecified: Secondary | ICD-10-CM

## 2013-04-07 MED ORDER — LORAZEPAM 0.5 MG PO TABS: 0.5000 mg | ORAL_TABLET | Freq: Three times a day (TID) | ORAL | Status: DC

## 2013-04-07 NOTE — Telephone Encounter (Signed)
Last seen 02/17/13 and filled 03/04/13 #90. Please advise    KP

## 2013-04-23 ENCOUNTER — Ambulatory Visit (HOSPITAL_BASED_OUTPATIENT_CLINIC_OR_DEPARTMENT_OTHER)
Admission: RE | Admit: 2013-04-23 | Discharge: 2013-04-23 | Disposition: A | Discharge status: Home / Self Care | Payer: BC Managed Care – PPO | Source: Ambulatory Visit | Attending: Family Medicine | Admitting: Family Medicine

## 2013-04-23 DIAGNOSIS — Z1231 Encounter for screening mammogram for malignant neoplasm of breast: Secondary | ICD-10-CM | POA: Insufficient documentation

## 2013-04-26 ENCOUNTER — Other Ambulatory Visit: Payer: Self-pay | Admitting: Family Medicine

## 2013-05-09 ENCOUNTER — Other Ambulatory Visit: Payer: Self-pay | Admitting: Family Medicine

## 2013-05-09 DIAGNOSIS — F419 Anxiety disorder, unspecified: Secondary | ICD-10-CM

## 2013-05-09 MED ORDER — LORAZEPAM 0.5 MG PO TABS: 0.5000 mg | ORAL_TABLET | Freq: Three times a day (TID) | ORAL | Status: DC

## 2013-05-09 NOTE — Telephone Encounter (Signed)
30

## 2013-05-09 NOTE — Telephone Encounter (Signed)
Last seen 02/17/13 and filled 04/07/13 #90. Please advise      KP

## 2013-05-12 ENCOUNTER — Encounter: Payer: Self-pay | Admitting: Family Medicine

## 2013-05-23 ENCOUNTER — Encounter: Payer: Self-pay | Admitting: Family Medicine

## 2013-05-23 ENCOUNTER — Ambulatory Visit (INDEPENDENT_AMBULATORY_CARE_PROVIDER_SITE_OTHER): Payer: BC Managed Care – PPO | Admitting: Family Medicine

## 2013-05-23 ENCOUNTER — Encounter: Payer: Self-pay | Admitting: Internal Medicine

## 2013-05-23 VITALS — BP 118/74 | HR 63 | Temp 98.6°F | Ht 68.0 in | Wt 363.2 lb

## 2013-05-23 DIAGNOSIS — I10 Essential (primary) hypertension: Secondary | ICD-10-CM

## 2013-05-23 DIAGNOSIS — E785 Hyperlipidemia, unspecified: Secondary | ICD-10-CM

## 2013-05-23 DIAGNOSIS — C4359 Malignant melanoma of other part of trunk: Secondary | ICD-10-CM

## 2013-05-23 DIAGNOSIS — Z0000 Encounter for general adult medical examination without abnormal findings: Secondary | ICD-10-CM

## 2013-05-23 DIAGNOSIS — Z6841 Body Mass Index (BMI) 40.0 and over, adult: Secondary | ICD-10-CM

## 2013-05-23 DIAGNOSIS — D0359 Melanoma in situ of other part of trunk: Secondary | ICD-10-CM

## 2013-05-23 LAB — HEPATIC FUNCTION PANEL
ALT: 24 U/L (ref 0–35)
AST: 19 U/L (ref 0–37)
Alkaline Phosphatase: 54 U/L (ref 39–117)
Bilirubin, Direct: 0.1 mg/dL (ref 0.0–0.3)
Total Bilirubin: 0.9 mg/dL (ref 0.3–1.2)

## 2013-05-23 LAB — LIPID PANEL
LDL Cholesterol: 83 mg/dL (ref 0–99)
Total CHOL/HDL Ratio: 3
VLDL: 19.2 mg/dL (ref 0.0–40.0)

## 2013-05-23 LAB — POCT URINALYSIS DIPSTICK
Blood, UA: NEGATIVE
Nitrite, UA: NEGATIVE
Spec Grav, UA: <=1.005
Urobilinogen, UA: 0.2
pH, UA: 7.5

## 2013-05-23 LAB — CBC WITH DIFFERENTIAL/PLATELET
Eosinophils Relative: 2.2 % (ref 0.0–5.0)
HCT: 36.3 % (ref 36.0–46.0)
Lymphocytes Relative: 30.8 % (ref 12.0–46.0)
Lymphs Abs: 2.4 10*3/uL (ref 0.7–4.0)
Monocytes Relative: 6.6 % (ref 3.0–12.0)
Neutrophils Relative %: 59.9 % (ref 43.0–77.0)
Platelets: 199 10*3/uL (ref 150.0–400.0)
WBC: 7.7 10*3/uL (ref 4.5–10.5)

## 2013-05-23 LAB — MICROALBUMIN / CREATININE URINE RATIO
Creatinine,U: 66.5 mg/dL
Microalb Creat Ratio: 1.1 mg/g (ref 0.0–30.0)

## 2013-05-23 LAB — BASIC METABOLIC PANEL
Calcium: 9.3 mg/dL (ref 8.4–10.5)
GFR: 90.08 mL/min (ref >60.00)
Glucose, Bld: 100 mg/dL — ABNORMAL HIGH (ref 70–99)
Potassium: 4.3 mEq/L (ref 3.5–5.1)
Sodium: 141 mEq/L (ref 135–145)

## 2013-05-23 NOTE — Patient Instructions (Signed)
Preventive Care for Adults, Female A healthy lifestyle and preventive care can promote health and wellness. Preventive health guidelines for women include the following key practices.  A routine yearly physical is a good way to check with your caregiver about your health and preventive screening. It is a chance to share any concerns and updates on your health, and to receive a thorough exam.  Visit your dentist for a routine exam and preventive care every 6 months. Brush your teeth twice a day and floss once a day. Good oral hygiene prevents tooth decay and gum disease.  The frequency of eye exams is based on your age, health, family medical history, use of contact lenses, and other factors. Follow your caregiver's recommendations for frequency of eye exams.  Eat a healthy diet. Foods like vegetables, fruits, whole grains, low-fat dairy products, and lean protein foods contain the nutrients you need without too many calories. Decrease your intake of foods high in solid fats, added sugars, and salt. Eat the right amount of calories for you.Get information about a proper diet from your caregiver, if necessary.  Regular physical exercise is one of the most important things you can do for your health. Most adults should get at least 150 minutes of moderate-intensity exercise (any activity that increases your heart rate and causes you to sweat) each week. In addition, most adults need muscle-strengthening exercises on 2 or more days a week.  Maintain a healthy weight. The body mass index (BMI) is a screening tool to identify possible weight problems. It provides an estimate of body fat based on height and weight. Your caregiver can help determine your BMI, and can help you achieve or maintain a healthy weight.For adults 20 years and older:  A BMI below 18.5 is considered underweight.  A BMI of 18.5 to 24.9 is normal.  A BMI of 25 to 29.9 is considered overweight.  A BMI of 30 and above is  considered obese.  Maintain normal blood lipids and cholesterol levels by exercising and minimizing your intake of saturated fat. Eat a balanced diet with plenty of fruit and vegetables. Blood tests for lipids and cholesterol should begin at age 20 and be repeated every 5 years. If your lipid or cholesterol levels are high, you are over 50, or you are at high risk for heart disease, you may need your cholesterol levels checked more frequently.Ongoing high lipid and cholesterol levels should be treated with medicines if diet and exercise are not effective.  If you smoke, find out from your caregiver how to quit. If you do not use tobacco, do not start.  If you are pregnant, do not drink alcohol. If you are breastfeeding, be very cautious about drinking alcohol. If you are not pregnant and choose to drink alcohol, do not exceed 1 drink per day. One drink is considered to be 12 ounces (355 mL) of beer, 5 ounces (148 mL) of wine, or 1.5 ounces (44 mL) of liquor.  Avoid use of street drugs. Do not share needles with anyone. Ask for help if you need support or instructions about stopping the use of drugs.  High blood pressure causes heart disease and increases the risk of stroke. Your blood pressure should be checked at least every 1 to 2 years. Ongoing high blood pressure should be treated with medicines if weight loss and exercise are not effective.  If you are 55 to 53 years old, ask your caregiver if you should take aspirin to prevent strokes.  Diabetes   screening involves taking a blood sample to check your fasting blood sugar level. This should be done once every 3 years, after age 45, if you are within normal weight and without risk factors for diabetes. Testing should be considered at a younger age or be carried out more frequently if you are overweight and have at least 1 risk factor for diabetes.  Breast cancer screening is essential preventive care for women. You should practice "breast  self-awareness." This means understanding the normal appearance and feel of your breasts and may include breast self-examination. Any changes detected, no matter how small, should be reported to a caregiver. Women in their 20s and 30s should have a clinical breast exam (CBE) by a caregiver as part of a regular health exam every 1 to 3 years. After age 40, women should have a CBE every year. Starting at age 40, women should consider having a mammography (breast X-ray test) every year. Women who have a family history of breast cancer should talk to their caregiver about genetic screening. Women at a high risk of breast cancer should talk to their caregivers about having magnetic resonance imaging (MRI) and a mammography every year.  The Pap test is a screening test for cervical cancer. A Pap test can show cell changes on the cervix that might become cervical cancer if left untreated. A Pap test is a procedure in which cells are obtained and examined from the lower end of the uterus (cervix).  Women should have a Pap test starting at age 21.  Between ages 21 and 29, Pap tests should be repeated every 2 years.  Beginning at age 30, you should have a Pap test every 3 years as long as the past 3 Pap tests have been normal.  Some women have medical problems that increase the chance of getting cervical cancer. Talk to your caregiver about these problems. It is especially important to talk to your caregiver if a new problem develops soon after your last Pap test. In these cases, your caregiver may recommend more frequent screening and Pap tests.  The above recommendations are the same for women who have or have not gotten the vaccine for human papillomavirus (HPV).  If you had a hysterectomy for a problem that was not cancer or a condition that could lead to cancer, then you no longer need Pap tests. Even if you no longer need a Pap test, a regular exam is a good idea to make sure no other problems are  starting.  If you are between ages 65 and 70, and you have had normal Pap tests going back 10 years, you no longer need Pap tests. Even if you no longer need a Pap test, a regular exam is a good idea to make sure no other problems are starting.  If you have had past treatment for cervical cancer or a condition that could lead to cancer, you need Pap tests and screening for cancer for at least 20 years after your treatment.  If Pap tests have been discontinued, risk factors (such as a new sexual partner) need to be reassessed to determine if screening should be resumed.  The HPV test is an additional test that may be used for cervical cancer screening. The HPV test looks for the virus that can cause the cell changes on the cervix. The cells collected during the Pap test can be tested for HPV. The HPV test could be used to screen women aged 30 years and older, and should   be used in women of any age who have unclear Pap test results. After the age of 30, women should have HPV testing at the same frequency as a Pap test.  Colorectal cancer can be detected and often prevented. Most routine colorectal cancer screening begins at the age of 50 and continues through age 75. However, your caregiver may recommend screening at an earlier age if you have risk factors for colon cancer. On a yearly basis, your caregiver may provide home test kits to check for hidden blood in the stool. Use of a small camera at the end of a tube, to directly examine the colon (sigmoidoscopy or colonoscopy), can detect the earliest forms of colorectal cancer. Talk to your caregiver about this at age 50, when routine screening begins. Direct examination of the colon should be repeated every 5 to 10 years through age 75, unless early forms of pre-cancerous polyps or small growths are found.  Hepatitis C blood testing is recommended for all people born from 1945 through 1965 and any individual with known risks for hepatitis C.  Practice  safe sex. Use condoms and avoid high-risk sexual practices to reduce the spread of sexually transmitted infections (STIs). STIs include gonorrhea, chlamydia, syphilis, trichomonas, herpes, HPV, and human immunodeficiency virus (HIV). Herpes, HIV, and HPV are viral illnesses that have no cure. They can result in disability, cancer, and death. Sexually active women aged 25 and younger should be checked for chlamydia. Older women with new or multiple partners should also be tested for chlamydia. Testing for other STIs is recommended if you are sexually active and at increased risk.  Osteoporosis is a disease in which the bones lose minerals and strength with aging. This can result in serious bone fractures. The risk of osteoporosis can be identified using a bone density scan. Women ages 65 and over and women at risk for fractures or osteoporosis should discuss screening with their caregivers. Ask your caregiver whether you should take a calcium supplement or vitamin D to reduce the rate of osteoporosis.  Menopause can be associated with physical symptoms and risks. Hormone replacement therapy is available to decrease symptoms and risks. You should talk to your caregiver about whether hormone replacement therapy is right for you.  Use sunscreen with sun protection factor (SPF) of 30 or more. Apply sunscreen liberally and repeatedly throughout the day. You should seek shade when your shadow is shorter than you. Protect yourself by wearing long sleeves, pants, a wide-brimmed hat, and sunglasses year round, whenever you are outdoors.  Once a month, do a whole body skin exam, using a mirror to look at the skin on your back. Notify your caregiver of new moles, moles that have irregular borders, moles that are larger than a pencil eraser, or moles that have changed in shape or color.  Stay current with required immunizations.  Influenza. You need a dose every fall (or winter). The composition of the flu vaccine  changes each year, so being vaccinated once is not enough.  Pneumococcal polysaccharide. You need 1 to 2 doses if you smoke cigarettes or if you have certain chronic medical conditions. You need 1 dose at age 65 (or older) if you have never been vaccinated.  Tetanus, diphtheria, pertussis (Tdap, Td). Get 1 dose of Tdap vaccine if you are younger than age 65, are over 65 and have contact with an infant, are a healthcare worker, are pregnant, or simply want to be protected from whooping cough. After that, you need a Td   booster dose every 10 years. Consult your caregiver if you have not had at least 3 tetanus and diphtheria-containing shots sometime in your life or have a deep or dirty wound.  HPV. You need this vaccine if you are a woman age 26 or younger. The vaccine is given in 3 doses over 6 months.  Measles, mumps, rubella (MMR). You need at least 1 dose of MMR if you were born in 1957 or later. You may also need a second dose.  Meningococcal. If you are age 19 to 21 and a first-year college student living in a residence hall, or have one of several medical conditions, you need to get vaccinated against meningococcal disease. You may also need additional booster doses.  Zoster (shingles). If you are age 60 or older, you should get this vaccine.  Varicella (chickenpox). If you have never had chickenpox or you were vaccinated but received only 1 dose, talk to your caregiver to find out if you need this vaccine.  Hepatitis A. You need this vaccine if you have a specific risk factor for hepatitis A virus infection or you simply wish to be protected from this disease. The vaccine is usually given as 2 doses, 6 to 18 months apart.  Hepatitis B. You need this vaccine if you have a specific risk factor for hepatitis B virus infection or you simply wish to be protected from this disease. The vaccine is given in 3 doses, usually over 6 months. Preventive Services / Frequency Ages 19 to 39  Blood  pressure check.** / Every 1 to 2 years.  Lipid and cholesterol check.** / Every 5 years beginning at age 20.  Clinical breast exam.** / Every 3 years for women in their 20s and 30s.  Pap test.** / Every 2 years from ages 21 through 29. Every 3 years starting at age 30 through age 65 or 70 with a history of 3 consecutive normal Pap tests.  HPV screening.** / Every 3 years from ages 30 through ages 65 to 70 with a history of 3 consecutive normal Pap tests.  Hepatitis C blood test.** / For any individual with known risks for hepatitis C.  Skin self-exam. / Monthly.  Influenza immunization.** / Every year.  Pneumococcal polysaccharide immunization.** / 1 to 2 doses if you smoke cigarettes or if you have certain chronic medical conditions.  Tetanus, diphtheria, pertussis (Tdap, Td) immunization. / A one-time dose of Tdap vaccine. After that, you need a Td booster dose every 10 years.  HPV immunization. / 3 doses over 6 months, if you are 26 and younger.  Measles, mumps, rubella (MMR) immunization. / You need at least 1 dose of MMR if you were born in 1957 or later. You may also need a second dose.  Meningococcal immunization. / 1 dose if you are age 19 to 21 and a first-year college student living in a residence hall, or have one of several medical conditions, you need to get vaccinated against meningococcal disease. You may also need additional booster doses.  Varicella immunization.** / Consult your caregiver.  Hepatitis A immunization.** / Consult your caregiver. 2 doses, 6 to 18 months apart.  Hepatitis B immunization.** / Consult your caregiver. 3 doses usually over 6 months. Ages 40 to 64  Blood pressure check.** / Every 1 to 2 years.  Lipid and cholesterol check.** / Every 5 years beginning at age 20.  Clinical breast exam.** / Every year after age 40.  Mammogram.** / Every year beginning at age 40   and continuing for as long as you are in good health. Consult with your  caregiver.  Pap test.** / Every 3 years starting at age 30 through age 65 or 70 with a history of 3 consecutive normal Pap tests.  HPV screening.** / Every 3 years from ages 30 through ages 65 to 70 with a history of 3 consecutive normal Pap tests.  Fecal occult blood test (FOBT) of stool. / Every year beginning at age 50 and continuing until age 75. You may not need to do this test if you get a colonoscopy every 10 years.  Flexible sigmoidoscopy or colonoscopy.** / Every 5 years for a flexible sigmoidoscopy or every 10 years for a colonoscopy beginning at age 50 and continuing until age 75.  Hepatitis C blood test.** / For all people born from 1945 through 1965 and any individual with known risks for hepatitis C.  Skin self-exam. / Monthly.  Influenza immunization.** / Every year.  Pneumococcal polysaccharide immunization.** / 1 to 2 doses if you smoke cigarettes or if you have certain chronic medical conditions.  Tetanus, diphtheria, pertussis (Tdap, Td) immunization.** / A one-time dose of Tdap vaccine. After that, you need a Td booster dose every 10 years.  Measles, mumps, rubella (MMR) immunization. / You need at least 1 dose of MMR if you were born in 1957 or later. You may also need a second dose.  Varicella immunization.** / Consult your caregiver.  Meningococcal immunization.** / Consult your caregiver.  Hepatitis A immunization.** / Consult your caregiver. 2 doses, 6 to 18 months apart.  Hepatitis B immunization.** / Consult your caregiver. 3 doses, usually over 6 months. Ages 65 and over  Blood pressure check.** / Every 1 to 2 years.  Lipid and cholesterol check.** / Every 5 years beginning at age 20.  Clinical breast exam.** / Every year after age 40.  Mammogram.** / Every year beginning at age 40 and continuing for as long as you are in good health. Consult with your caregiver.  Pap test.** / Every 3 years starting at age 30 through age 65 or 70 with a 3  consecutive normal Pap tests. Testing can be stopped between 65 and 70 with 3 consecutive normal Pap tests and no abnormal Pap or HPV tests in the past 10 years.  HPV screening.** / Every 3 years from ages 30 through ages 65 or 70 with a history of 3 consecutive normal Pap tests. Testing can be stopped between 65 and 70 with 3 consecutive normal Pap tests and no abnormal Pap or HPV tests in the past 10 years.  Fecal occult blood test (FOBT) of stool. / Every year beginning at age 50 and continuing until age 75. You may not need to do this test if you get a colonoscopy every 10 years.  Flexible sigmoidoscopy or colonoscopy.** / Every 5 years for a flexible sigmoidoscopy or every 10 years for a colonoscopy beginning at age 50 and continuing until age 75.  Hepatitis C blood test.** / For all people born from 1945 through 1965 and any individual with known risks for hepatitis C.  Osteoporosis screening.** / A one-time screening for women ages 65 and over and women at risk for fractures or osteoporosis.  Skin self-exam. / Monthly.  Influenza immunization.** / Every year.  Pneumococcal polysaccharide immunization.** / 1 dose at age 65 (or older) if you have never been vaccinated.  Tetanus, diphtheria, pertussis (Tdap, Td) immunization. / A one-time dose of Tdap vaccine if you are over   65 and have contact with an infant, are a healthcare worker, or simply want to be protected from whooping cough. After that, you need a Td booster dose every 10 years.  Varicella immunization.** / Consult your caregiver.  Meningococcal immunization.** / Consult your caregiver.  Hepatitis A immunization.** / Consult your caregiver. 2 doses, 6 to 18 months apart.  Hepatitis B immunization.** / Check with your caregiver. 3 doses, usually over 6 months. ** Family history and personal history of risk and conditions may change your caregiver's recommendations. Document Released: 11/28/2001 Document Revised: 12/25/2011  Document Reviewed: 02/27/2011 [employer] Patient Information 2014 [employer].  

## 2013-05-23 NOTE — Progress Notes (Signed)
Subjective:     Pam Phillips is a 52 y.o. female and is here for a comprehensive physical exam. The patient reports no problems.  History   Social History  . Marital Status: Married    Spouse Name: N/A    Number of Children: N/A  . 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 Smoking status: Never Smoker   . Smokeless tobacco: Not on file  . Alcohol Use: 0.6 oz/week    1 Glasses of wine per week     Comment: r-occ. social 1 wine per week  . Drug Use: No  . Sexually Active: Yes -- Female partner(s)   Other Topics Concern  . Not on file   Social History Narrative   Exercise-- zumba 4-5 x a week in summer,  2-3 x during school    Health Maintenance  Topic Date Due  . Colonoscopy  06/10/2010  . Influenza Vaccine  06/16/2013  . Mammogram  04/24/2015  . Pap Smear  05/07/2015  . Tetanus/tdap  01/07/2022    The following portions of the patient's history were reviewed and updated as appropriate:  She  has a past medical history of Anxiety; Hypertension; Hyperlipidemia; Cancer (07-04-12); Heart murmur; and Ankle swelling (07-04-12). She  does not have any pertinent problems on file. She  has past surgical history that includes Dental surgery (07-04-12); Cholecystectomy (07-04-12); and Melanoma excision (07/10/2012). Her family history includes COPD in her father; Cancer (age of onset: 81) in her father; Coronary artery disease (age of onset: 107) in an unspecified family member; Heart disease (age of onset: 16) in her father; and Hypertension in her father. She  reports that she has never smoked. She does not have any smokeless tobacco history on file. She reports that she drinks about 0.6 ounces of alcohol per week. She reports that she does not use illicit drugs. She has a current medication list which includes the following prescription(s): aspirin, fish oil-omega-3 fatty acids, lorazepam, lovastatin,  multivitamin, OVER THE COUNTER MEDICATION, tolterodine, and valsartan-hydrochlorothiazide. Current Outpatient Prescriptions on File Prior to Visit  Medication Sig Dispense Refill  . aspirin 81 MG EC tablet Take 81 mg by mouth every morning.       Marland Kitchen LORazepam (ATIVAN) 0.5 MG tablet Take 1 tablet (0.5 mg total) by mouth 3 (three) times daily. as needed  30 tablet  0  . lovastatin (MEVACOR) 40 MG tablet TAKE 1 TABLET BY MOUTH AT BEDTIME  90 tablet  1  . Multiple Vitamin (MULTIVITAMIN) tablet Take 1 tablet by mouth daily.        Marland Kitchen OVER THE COUNTER MEDICATION Place 1 drop into both eyes daily as needed. Lubricating eye drop      . tolterodine (DETROL) 2 MG tablet TAKE ONE TABLET BY MOUTH TWICE DAILY  60 tablet  5  . valsartan-hydrochlorothiazide (DIOVAN-HCT) 320-25 MG per tablet TAKE ONE TABLET BY MOUTH ONE TIME DAILY  30 tablet  2  . [DISCONTINUED] tolterodine (DETROL) 2 MG tablet Take 1 tablet (2 mg total) by mouth 2 (two) times daily.  60 tablet  2   No current facility-administered medications on file prior to visit.   She is allergic to other; codeine; and penicillins..  Review of Systems Review of Systems  Constitutional: Negative for activity change, appetite change and fatigue.  HENT: Negative for hearing loss, congestion, tinnitus and ear discharge.  dentist--no Eyes: Negative for visual  disturbance (see optho q1y -- vision corrected to 20/20 with glasses).  Respiratory: Negative for cough, chest tightness and shortness of breath.   Cardiovascular: Negative for chest pain, palpitations and leg swelling.  Gastrointestinal: Negative for abdominal pain, diarrhea, constipation and abdominal distention.  Genitourinary: Negative for urgency, frequency, decreased urine volume and difficulty urinating.  Musculoskeletal: Negative for back pain, arthralgias and gait problem.  Skin: Negative for color change, pallor and rash.  Neurological: Negative for dizziness, light-headedness, numbness and  headaches.  Hematological: Negative for adenopathy. Does not bruise/bleed easily.  Psychiatric/Behavioral: Negative for suicidal ideas, confusion, sleep disturbance, self-injury, dysphoric mood, decreased concentration and agitation.       Objective:    BP 118/74  Pulse 63  Temp(Src) 98.6 F (37 C) (Oral)  Ht 5\' 8"  (1.727 m)  Wt 363 lb 3.2 oz (164.746 kg)  BMI 55.24 kg/m2  SpO2 98% General appearance: alert, cooperative, appears stated age, no distress and morbidly obese Head: Normocephalic, without obvious abnormality, atraumatic Eyes: conjunctivae/corneas clear. PERRL, EOM's intact. Fundi benign. Ears: normal TM's and external ear canals both ears Nose: Nares normal. Septum midline. Mucosa normal. No drainage or sinus tenderness. Throat: lips, mucosa, and tongue normal; teeth and gums normal Neck: no adenopathy, no carotid bruit, no JVD, supple, symmetrical, trachea midline and thyroid not enlarged, symmetric, no tenderness/mass/nodules Back: symmetric, no curvature. ROM normal. No CVA tenderness. Lungs: clear to auscultation bilaterally Breasts: normal appearance, no masses or tenderness Heart: regular rate and rhythm, S1, S2 normal, no murmur, click, rub or gallop Abdomen: soft, non-tender; bowel sounds normal; no masses,  no organomegaly Pelvic: deferred-- pap done last year-- Extremities: extremities normal, atraumatic, no cyanosis or edema Pulses: 2+ and symmetric Skin: Skin color, texture, turgor normal. No rashes or lesions Lymph nodes: Cervical, supraclavicular, and axillary nodes normal. Neurologic: Alert and oriented X 3, normal strength and tone. Normal symmetric reflexes. Normal coordination and gait Psych-- no anxiety, no depression      Assessment:    Healthy female exam.      Plan:     ghm utd  Check labs See After Visit Summary for Counseling Recommendations

## 2013-05-24 NOTE — Assessment & Plan Note (Signed)
Annual derm for skin check

## 2013-05-24 NOTE — Assessment & Plan Note (Signed)
Discussed with pt diet and exercise 

## 2013-05-24 NOTE — Assessment & Plan Note (Signed)
Check labs 

## 2013-05-24 NOTE — Assessment & Plan Note (Signed)
Stable con't meds 

## 2013-06-02 ENCOUNTER — Encounter: Payer: Self-pay | Admitting: Family Medicine

## 2013-06-02 ENCOUNTER — Other Ambulatory Visit: Payer: Self-pay | Admitting: Internal Medicine

## 2013-06-02 DIAGNOSIS — F419 Anxiety disorder, unspecified: Secondary | ICD-10-CM

## 2013-06-02 MED ORDER — LORAZEPAM 0.5 MG PO TABS: 0.5000 mg | ORAL_TABLET | Freq: Three times a day (TID) | ORAL | Status: DC

## 2013-06-02 NOTE — Telephone Encounter (Signed)
Ok to fill for #[id number]

## 2013-06-02 NOTE — Telephone Encounter (Signed)
Last seen 05/23/13 and filled 05/09/13 #30. She normally gets 90 but got 30 while you out of the office. Please advise      KP

## 2013-07-03 ENCOUNTER — Ambulatory Visit (INDEPENDENT_AMBULATORY_CARE_PROVIDER_SITE_OTHER): Payer: BC Managed Care – PPO | Admitting: Family Medicine

## 2013-07-03 ENCOUNTER — Encounter: Payer: Self-pay | Admitting: Family Medicine

## 2013-07-03 VITALS — BP 140/96 | HR 76 | Temp 98.1°F | Resp 12 | Wt 361.0 lb

## 2013-07-03 DIAGNOSIS — F411 Generalized anxiety disorder: Secondary | ICD-10-CM

## 2013-07-03 DIAGNOSIS — F419 Anxiety disorder, unspecified: Secondary | ICD-10-CM

## 2013-07-03 DIAGNOSIS — R21 Rash and other nonspecific skin eruption: Secondary | ICD-10-CM

## 2013-07-03 DIAGNOSIS — Z23 Encounter for immunization: Secondary | ICD-10-CM

## 2013-07-03 MED ORDER — ERYTHROMYCIN BASE 500 MG PO TABS: 500.0000 mg | ORAL_TABLET | Freq: Two times a day (BID) | ORAL | Status: DC

## 2013-07-03 MED ORDER — LORAZEPAM 0.5 MG PO TABS: 0.5000 mg | ORAL_TABLET | Freq: Three times a day (TID) | ORAL | Status: DC

## 2013-07-03 MED ORDER — CLOTRIMAZOLE-BETAMETHASONE 1-0.05 % EX CREA: TOPICAL_CREAM | Freq: Two times a day (BID) | CUTANEOUS | Status: DC

## 2013-07-03 NOTE — Patient Instructions (Signed)

## 2013-07-03 NOTE — Progress Notes (Signed)
  Subjective:     Pam Phillips is a 52 y.o. female who presents for evaluation of a rash involving the neck. Rash started several weeks ago. Lesions are pink, and raised in texture. Rash has not changed over time. Rash is pruritic. Associated symptoms: none. Patient denies: abdominal pain, arthralgia, congestion, cough, crankiness, decrease in appetite, decrease in energy level, fever, headache, irritability, myalgia, nausea, sore throat and vomiting. Patient has not had contacts with similar rash. Patient has not had new exposures (soaps, lotions, laundry detergents, foods, medications, plants, insects or animals).  The following portions of the patient's history were reviewed and updated as appropriate: allergies, current medications, past family history, past medical history, past social history, past surgical history and problem list.  Review of Systems Pertinent items are noted in HPI.    Objective:    BP 140/96  Pulse 76  Temp(Src) 98.1 F (36.7 C) (Oral)  Resp 12  Wt 361 lb (163.749 kg)  BMI 54.9 kg/m2  SpO2 97%  LMP 06/26/2012 General:  alert, cooperative, appears stated age and no distress  Skin:  confluence of vesicles with ulcerated center     Assessment:    rash--- unknown etiology    Plan:    Medications: antibiotics: erythromycin and lotrisone. written an verbal instructions patient instruction given. Follow up in a few days.

## 2013-07-17 ENCOUNTER — Telehealth: Payer: Self-pay | Admitting: *Deleted

## 2013-07-17 ENCOUNTER — Encounter: Payer: Self-pay | Admitting: Internal Medicine

## 2013-07-17 NOTE — Telephone Encounter (Signed)
Patient has BMI of 55 and weight of 361-363 lbs. Is patient okay for direct hospital colonoscopy or does this patient need OV before scheduling colonoscopy? Please advise. Thank you.

## 2013-07-17 NOTE — Telephone Encounter (Signed)
Pt seen 04/25/13 for diarrhea reporting up to 45 stools daily; uses lomotil. Pt had a COLON on 04/28/13 with tubular adenomas and no dysplasia.  She states the diarrhea never got any better. She developed l side abdominal pain and saw her PCP Dr Ouida Sills and was placed on Cipro and Flagyl which she completes today. She also had a CT done at [facility] in the past couple of weeks. I have called for a copy, but Med Rec is closed until in the morning, She reports she completed the Lomotil and has none. She states the pain is on her left side that radiates to her back

## 2013-07-18 ENCOUNTER — Other Ambulatory Visit: Payer: Self-pay | Admitting: *Deleted

## 2013-07-18 DIAGNOSIS — Z1211 Encounter for screening for malignant neoplasm of colon: Secondary | ICD-10-CM

## 2013-07-18 NOTE — Telephone Encounter (Signed)
The note below is on the wrong pt. PLEASE DISREGARD. Pt is scheduled for 08/18/13 at [facility]. She will be informed Monday, 07/21/13 at [facility].

## 2013-07-19 ENCOUNTER — Other Ambulatory Visit: Payer: Self-pay | Admitting: Family Medicine

## 2013-07-21 ENCOUNTER — Ambulatory Visit (AMBULATORY_SURGERY_CENTER): Payer: BC Managed Care – PPO

## 2013-07-21 VITALS — Ht 69.0 in | Wt 361.0 lb

## 2013-07-21 DIAGNOSIS — Z1211 Encounter for screening for malignant neoplasm of colon: Secondary | ICD-10-CM

## 2013-07-21 MED ORDER — MOVIPREP 100 G PO SOLR: 1.0000 | Freq: Once | ORAL | Status: DC

## 2013-07-22 ENCOUNTER — Encounter: Payer: Self-pay | Admitting: Internal Medicine

## 2013-07-25 ENCOUNTER — Encounter (HOSPITAL_COMMUNITY): Payer: Self-pay | Admitting: *Deleted

## 2013-07-30 ENCOUNTER — Encounter: Payer: Self-pay | Admitting: Family Medicine

## 2013-07-30 ENCOUNTER — Encounter (HOSPITAL_COMMUNITY): Payer: Self-pay | Admitting: Pharmacy Technician

## 2013-08-04 ENCOUNTER — Encounter: Payer: BC Managed Care – PPO | Admitting: Gastroenterology

## 2013-08-05 ENCOUNTER — Encounter: Payer: Self-pay | Admitting: Family Medicine

## 2013-08-05 MED ORDER — LORAZEPAM 0.5 MG PO TABS: 0.5000 mg | ORAL_TABLET | Freq: Three times a day (TID) | ORAL | Status: DC | PRN

## 2013-08-05 NOTE — Telephone Encounter (Signed)
Last OV 9-18 Last Filled 07-03-13 #90 with 0 refills  Low risk

## 2013-08-05 NOTE — Telephone Encounter (Signed)
Med filled.  

## 2013-08-09 ENCOUNTER — Encounter: Payer: Self-pay | Admitting: Family Medicine

## 2013-08-09 ENCOUNTER — Encounter: Payer: Self-pay | Admitting: Internal Medicine

## 2013-08-11 ENCOUNTER — Encounter: Payer: BC Managed Care – PPO | Admitting: Internal Medicine

## 2013-08-11 ENCOUNTER — Encounter: Payer: BC Managed Care – PPO | Admitting: Gastroenterology

## 2013-08-18 ENCOUNTER — Encounter (HOSPITAL_COMMUNITY): Payer: BC Managed Care – PPO | Admitting: Anesthesiology

## 2013-08-18 ENCOUNTER — Ambulatory Visit (HOSPITAL_COMMUNITY): Payer: BC Managed Care – PPO | Admitting: Anesthesiology

## 2013-08-18 ENCOUNTER — Encounter (HOSPITAL_COMMUNITY): Payer: Self-pay | Admitting: Certified Registered"

## 2013-08-18 ENCOUNTER — Ambulatory Visit (HOSPITAL_COMMUNITY)
Admission: RE | Admit: 2013-08-18 | Discharge: 2013-08-18 | Disposition: A | Discharge status: Home / Self Care | Payer: BC Managed Care – PPO | Source: Ambulatory Visit | Attending: Internal Medicine | Admitting: Internal Medicine

## 2013-08-18 ENCOUNTER — Encounter (HOSPITAL_COMMUNITY)
Admission: RE | Disposition: A | Discharge status: Home / Self Care | Payer: Self-pay | Source: Ambulatory Visit | Attending: Internal Medicine

## 2013-08-18 DIAGNOSIS — I10 Essential (primary) hypertension: Secondary | ICD-10-CM | POA: Insufficient documentation

## 2013-08-18 DIAGNOSIS — Z0000 Encounter for general adult medical examination without abnormal findings: Secondary | ICD-10-CM

## 2013-08-18 DIAGNOSIS — D126 Benign neoplasm of colon, unspecified: Secondary | ICD-10-CM

## 2013-08-18 DIAGNOSIS — Z6841 Body Mass Index (BMI) 40.0 and over, adult: Secondary | ICD-10-CM | POA: Insufficient documentation

## 2013-08-18 DIAGNOSIS — Z85820 Personal history of malignant melanoma of skin: Secondary | ICD-10-CM | POA: Insufficient documentation

## 2013-08-18 DIAGNOSIS — E785 Hyperlipidemia, unspecified: Secondary | ICD-10-CM | POA: Insufficient documentation

## 2013-08-18 DIAGNOSIS — Z1211 Encounter for screening for malignant neoplasm of colon: Secondary | ICD-10-CM | POA: Insufficient documentation

## 2013-08-18 HISTORY — PX: COLONOSCOPY: SHX5424

## 2013-08-18 SURGERY — COLONOSCOPY
Anesthesia: Monitor Anesthesia Care

## 2013-08-18 MED ORDER — LACTATED RINGERS IV SOLN
INTRAVENOUS | Status: DC | PRN
  Administered 2013-08-18: 09:00:00 via INTRAVENOUS

## 2013-08-18 MED ORDER — PROPOFOL INFUSION 10 MG/ML OPTIME
INTRAVENOUS | Status: DC | PRN
  Administered 2013-08-18: 120 ug/kg/min via INTRAVENOUS

## 2013-08-18 MED ORDER — MIDAZOLAM HCL 5 MG/5ML IJ SOLN
INTRAMUSCULAR | Status: DC | PRN
  Administered 2013-08-18 (×2): 1 mg via INTRAVENOUS

## 2013-08-18 MED ORDER — KETAMINE HCL 10 MG/ML IJ SOLN
INTRAMUSCULAR | Status: DC | PRN
  Administered 2013-08-18: 10 mg via INTRAVENOUS

## 2013-08-18 NOTE — Op Note (Signed)
[facility] [address] [place], [address]   COLONOSCOPY PROCEDURE REPORT  PATIENT: Pam Phillips, Pam Phillips  MR#: [id number] BIRTHDATE: 11/15/1960 , 52  yrs. old GENDER: Female ENDOSCOPIST: Beverley Fiedler, MD REFERRED XB:[id number] Lowne, DO PROCEDURE DATE:  08/18/2013 PROCEDURE:   Colonoscopy with snare polypectomy and Colonoscopy with cold biopsy polypectomy First Screening Colonoscopy - Avg.  risk and is 50 yrs.  old or older Yes.  Prior Negative Screening - Now for repeat screening. N/A  History of Adenoma - Now for follow-up colonoscopy & has been > or = to 3 yrs.  N/A  Polyps Removed Today? Yes. ASA CLASS:   Class III INDICATIONS:average risk screening and first colonoscopy. MEDICATIONS: MAC sedation, administered by CRNA and See Anesthesia Report.  DESCRIPTION OF PROCEDURE:   After the risks benefits and alternatives of the procedure were thoroughly explained, informed consent was obtained.  A digital rectal exam revealed a skin tag. The Pentax Colonoscope [id number]  endoscope was introduced through the anus and advanced to the cecum, which was identified by both the appendix and ileocecal valve. No adverse events experienced. The quality of the prep was good, using MoviPrep  The instrument was then slowly withdrawn as the colon was fully examined.     COLON FINDINGS: A sessile polyp measuring 3 mm in size was found at the cecum.  A polypectomy was performed with cold forceps.  The resection was complete and the polyp tissue was completely retrieved.   A sessile polyp measuring 6-7 mm in size was found at the ileocecal valve.  A polypectomy was performed with a cold snare.  The resection was complete and the polyp tissue was completely retrieved.   The colon mucosa was otherwise normal. Retroflexed views revealed hypertrophy anal papilla and skin tag. The time to cecum=5 minutes 00 seconds.  Withdrawal time=24 minutes 00 seconds.  The scope was withdrawn and the  procedure completed.  COMPLICATIONS: There were no complications.  ENDOSCOPIC IMPRESSION: 1.   Sessile polyp measuring 3 mm in size was found at the cecum; polypectomy was performed with cold forceps 2.   Sessile polyp measuring 6-7 mm in size was found at the ileocecal valve; polypectomy was performed with a cold snare 3.   The colon mucosa was otherwise normal  RECOMMENDATIONS: 1.  Await pathology results 2.  Timing of repeat colonoscopy will be determined by pathology findings. 3.  You will receive a letter within 1-2 weeks with the results of your biopsy as well as final recommendations.  Please call my office if you have not received a letter after 3 weeks.   eSigned:  Beverley Fiedler, MD 08/18/2013 10:28 AM   cc: The Patient and Lelon Perla, DO

## 2013-08-18 NOTE — Anesthesia Preprocedure Evaluation (Addendum)
Anesthesia Evaluation  Patient identified by MRN, date of birth, ID band Patient awake    Reviewed: Allergy & Precautions, H&P , NPO status , Patient's Chart, lab work & pertinent test results  Airway Mallampati: II TM Distance: >3 FB Neck ROM: full    Dental no notable dental hx. (+) Teeth Intact and Dental Advisory Given   Pulmonary neg pulmonary ROS, shortness of breath and with exertion,  breath sounds clear to auscultation  Pulmonary exam normal       Cardiovascular hypertension, Pt. on medications Rhythm:regular Rate:Normal     Neuro/Psych negative neurological ROS  negative psych ROS   GI/Hepatic negative GI ROS, Neg liver ROS,   Endo/Other  negative endocrine ROSMorbid obesity  Renal/GU negative Renal ROS  negative genitourinary   Musculoskeletal   Abdominal (+) + obese,   Peds  Hematology negative hematology ROS (+)   Anesthesia Other Findings   Reproductive/Obstetrics negative OB ROS                          Anesthesia Physical Anesthesia Plan  ASA: III  Anesthesia Plan: MAC   Post-op Pain Management:    Induction:   Airway Management Planned: Simple Face Mask  Additional Equipment:   Intra-op Plan:   Post-operative Plan:   Informed Consent: I have reviewed the patients History and Physical, chart, labs and discussed the procedure including the risks, benefits and alternatives for the proposed anesthesia with the patient or authorized representative who has indicated his/her understanding and acceptance.   Dental Advisory Given  Plan Discussed with: CRNA and Surgeon  Anesthesia Plan Comments:         Anesthesia Quick Evaluation

## 2013-08-18 NOTE — Anesthesia Postprocedure Evaluation (Signed)
  Anesthesia Post-op Note  Patient: Pam Phillips  Procedure(s) Performed: Procedure(s) (LRB): COLONOSCOPY (N/A)  Patient Location: PACU  Anesthesia Type: MAC  Level of Consciousness: awake and alert   Airway and Oxygen Therapy: Patient Spontanous Breathing  Post-op Pain: mild  Post-op Assessment: Post-op Vital signs reviewed, Patient's Cardiovascular Status Stable, Respiratory Function Stable, Patent Airway and No signs of Nausea or vomiting  Last Vitals:  Filed Vitals:   08/18/13 1030  BP: 125/71  Temp:   Resp: 14    Post-op Vital Signs: stable   Complications: No apparent anesthesia complications

## 2013-08-18 NOTE — Transfer of Care (Signed)
Immediate Anesthesia Transfer of Care Note  Patient: Pam Phillips  Procedure(s) Performed: Procedure(s): COLONOSCOPY (N/A)  Patient Location: PACU  Anesthesia Type:MAC  Level of Consciousness: awake, alert  and oriented  Airway & Oxygen Therapy: Patient Spontanous Breathing and Patient connected to face mask oxygen  Post-op Assessment: Report given to PACU RN and Post -op Vital signs reviewed and stable  Post vital signs: Reviewed and stable  Complications: No apparent anesthesia complications

## 2013-08-18 NOTE — H&P (Signed)
HPI: Pam Phillips is a 52 year old female with a past medical history of morbid obesity, hypertension, hyperlipidemia, melanoma in situ, and anxiety who is here for screening colonoscopy. She was sent for direct colonoscopy for average risk screening, but at her previsit it was determined she did not meet criteria for outpatient procedure in our outpatient endoscopy unit due to BMI. Thus the procedure was scheduled in the outpatient hospital setting. She is here today with her husband. She denies complaint. She tolerated the prep well. She denies abdominal pain, change in bowel habits, diarrhea or constipation. She reports no family history of known polyps or colon cancers. She has never had a screening colonoscopy.  Past Medical History  Diagnosis Date  . Anxiety   . Hypertension   . Hyperlipidemia   . Heart murmur   . Ankle swelling 07-04-12    mostly left ankle -wears compression hose occ.  . Cancer 07-04-12    dx. Melanoma in situ -back(mid-scapula area)    Past Surgical History  Procedure Laterality Date  . Dental surgery  07-04-12    2 wisdom teeth and 2 other teeth  . Melanoma excision  07/10/2012    Procedure: MELANOMA EXCISION;  Surgeon: Almond Lint, MD;  Location: [facility]S;  Service: General;  Laterality: N/A;  excision of back melanoma insitu  . Cholecystectomy  07-04-12    '03-Lap. due to gallstones[place]    No current facility-administered medications for this encounter.    Allergies  Allergen Reactions  . Other Swelling    Peaches(fresh) causes throat itching-swelling  . Codeine     Nausea & vomiting  . Penicillins     angioedema    Family History  Problem Relation Age of Onset  . Coronary artery disease  36    1st degree female  . Hypertension Father   . COPD Father   . Heart disease Father 71    MI  . Cancer Father 53    prostate ca  . Colon cancer Neg Hx   . Pancreatic cancer Neg Hx   . Stomach cancer Neg Hx     History  Substance Use Topics  .  Smoking status: Never Smoker   . Smokeless tobacco: Never Used  . Alcohol Use: 0.6 oz/week    1 Glasses of wine per week     Comment: r-occ. social 1 wine per week    ROS: As per history of present illness, otherwise negative  BP 138/87  Temp(Src) 97.5 F (36.4 C) (Oral)  Ht 5\' 8"  (1.727 m)  Wt 350 lb (158.759 kg)  BMI 53.23 kg/m2  SpO2 96%  LMP 06/26/2012 Gen: awake, alert, NAD HEENT: anicteric, op clear CV: RRR, no mrg Pulm: CTA b/l Abd: soft, NT/ND, +BS throughout Ext: no c/c/e Neuro: nonfocal   RELEVANT LABS AND IMAGING: CBC    Component Value Date/Time   WBC 7.7 05/23/2013 1129   RBC 3.91 05/23/2013 1129   HGB 12.4 05/23/2013 1129   HCT 36.3 05/23/2013 1129   PLT 199.0 05/23/2013 1129   MCV 92.9 05/23/2013 1129   MCH 30.8 07/04/2012 1015   MCHC 34.2 05/23/2013 1129   RDW 13.4 05/23/2013 1129   LYMPHSABS 2.4 05/23/2013 1129   MONOABS 0.5 05/23/2013 1129   EOSABS 0.2 05/23/2013 1129   BASOSABS 0.0 05/23/2013 1129    CMP     Component Value Date/Time   NA 141 05/23/2013 1129   K 4.3 05/23/2013 1129   CL 105 05/23/2013 1129   CO2 30  05/23/2013 1129   GLUCOSE 100* 05/23/2013 1129   GLUCOSE 128* 08/03/2006 0932   BUN 13 05/23/2013 1129   CREATININE 0.7 05/23/2013 1129   CALCIUM 9.3 05/23/2013 1129   PROT 7.1 05/23/2013 1129   ALBUMIN 4.0 05/23/2013 1129   AST 19 05/23/2013 1129   ALT 24 05/23/2013 1129   ALKPHOS 54 05/23/2013 1129   BILITOT 0.9 05/23/2013 1129   GFRNONAA >90 07/04/2012 1015   GFRAA >90 07/04/2012 1015    ASSESSMENT/PLAN: 52 year old female with a past medical history of morbid obesity, hypertension, hyperlipidemia, melanoma in situ, and anxiety who is here for screening colonoscopy.  1.  CRC screening -- see is average risk and presents for outpatient screening colonoscopy. The nature of the procedure, as well as the risks, benefits, and alternatives were carefully and thoroughly reviewed with the patient. Ample time for discussion and questions allowed. The patient understood, was  satisfied, and agreed to proceed.

## 2013-08-19 ENCOUNTER — Encounter: Payer: Self-pay | Admitting: Internal Medicine

## 2013-08-19 ENCOUNTER — Encounter (HOSPITAL_COMMUNITY): Payer: Self-pay | Admitting: Internal Medicine

## 2013-08-26 ENCOUNTER — Encounter: Payer: Self-pay | Admitting: Family Medicine

## 2013-08-27 ENCOUNTER — Telehealth: Payer: Self-pay | Admitting: *Deleted

## 2013-08-27 ENCOUNTER — Other Ambulatory Visit: Payer: Self-pay | Admitting: *Deleted

## 2013-08-27 MED ORDER — LORAZEPAM 0.5 MG PO TABS: 0.5000 mg | ORAL_TABLET | Freq: Three times a day (TID) | ORAL | Status: DC | PRN

## 2013-08-27 NOTE — Telephone Encounter (Signed)
OK # 90 tid prn only

## 2013-08-27 NOTE — Telephone Encounter (Signed)
Patient is requesting refill for Lorazepam. Patient normally gets #90. On last refill the there was an error with the quantity. See last email note    Last seen on 07/03/2013  Last filled on 08/05/2013  UDS-02/17/2013-low risk, contract signed  Please advise. SW

## 2013-09-13 ENCOUNTER — Other Ambulatory Visit: Payer: Self-pay | Admitting: Family Medicine

## 2013-09-21 ENCOUNTER — Other Ambulatory Visit: Payer: Self-pay | Admitting: Family Medicine

## 2013-09-29 ENCOUNTER — Encounter: Payer: Self-pay | Admitting: Family Medicine

## 2013-09-29 MED ORDER — LORAZEPAM 0.5 MG PO TABS: 0.5000 mg | ORAL_TABLET | Freq: Three times a day (TID) | ORAL | Status: DC | PRN

## 2013-09-29 NOTE — Telephone Encounter (Signed)
Last seen 07/03/13 and filled 08/27/13 #90. Please advise      KP

## 2013-10-17 ENCOUNTER — Encounter: Payer: BC Managed Care – PPO | Attending: Family Medicine | Admitting: *Deleted

## 2013-10-17 ENCOUNTER — Encounter: Payer: Self-pay | Admitting: *Deleted

## 2013-10-17 DIAGNOSIS — Z713 Dietary counseling and surveillance: Secondary | ICD-10-CM | POA: Insufficient documentation

## 2013-10-17 NOTE — Progress Notes (Signed)
Medical Nutrition Therapy:  Appt start time: 1030 end time:  1130.  Assessment:  Patient here today for weight management. She reports that she had lost some weight 2 years ago, and has kept off about 10 pounds. She reports that her biggest challenges with diet are portion control and snacking on sweets. She works in the office at a school and states that she has a hard time controlling intake when someone brings in sweets. She admits to "all-or-nothing" mindset when it comes to eating unhealthy foods. She does exercise regularly.  MEDICATIONS: Reviewed   DIETARY INTAKE:   Usual eating pattern includes 3 meals and >/=3 snacks per day.  24-hr recall:  B ( AM): 2 slices toast, peanut butter OR oatmeal OR Hot Pocket/breakfast sandwich, black coffee, 4 oz orange juice Snk ( AM): Sweets occasionally, granola bar, yogurt, cheese sticks , candy L ( PM): Sandwich (ham/turkey, cheese, mustard/mayo occasionally), chips OR leftovers OR frozen dinners Snk ( PM): At work, candy; at home, coffee, sweets D ( PM): Grilled chicken, potato/rice, vegetable OR spaghetti, salad Snk ( PM): Same Beverages: Water, black coffee  Usual physical activity: Zumba twice weekly, sometimes swims  Estimated energy needs: 1800 calories 225 g carbohydrates 213 g protein 50 g fat  Progress Towards Goal(s):  In progress.   Nutritional Diagnosis:  -3.3 Overweight/obesity As related to excessive energy intake.  As evidenced by BMI 54.5.    Intervention:  Nutrition counseling. We discussed strategies for weight loss, including balancing nutrients (carbs, protein, fat), portion control, healthy snacks, and exercise.   Goals:  1. 1 pound weight loss per week.  2. Monitor portion size using plate method for meal planning.  3. Plan for up to 3 healthy snacks daily (up to 150 kcal).  4. Limit intake of sweets.  5. Try to limit "all-or-nothing" thinking. Allow limited intake of unhealthy foods.  6. Continue to exercise  regularly  Handouts given during visit include:  Weight loss tips  Meal plan card  Monitoring/Evaluation:  Dietary intake, exercise, and body weight in 1 month(s).

## 2013-10-30 ENCOUNTER — Encounter: Payer: Self-pay | Admitting: Family Medicine

## 2013-10-30 MED ORDER — LORAZEPAM 0.5 MG PO TABS: 0.5000 mg | ORAL_TABLET | Freq: Three times a day (TID) | ORAL | Status: DC | PRN

## 2013-10-30 NOTE — Telephone Encounter (Signed)
Last seen 07/03/13 and filled 09/29/13 #90. Please advise      KP

## 2013-11-04 ENCOUNTER — Ambulatory Visit (INDEPENDENT_AMBULATORY_CARE_PROVIDER_SITE_OTHER): Payer: BC Managed Care – PPO | Admitting: General Surgery

## 2013-11-04 ENCOUNTER — Encounter (INDEPENDENT_AMBULATORY_CARE_PROVIDER_SITE_OTHER): Payer: Self-pay | Admitting: General Surgery

## 2013-11-04 VITALS — BP 138/78 | HR 78 | Temp 98.0°F | Resp 18 | Ht 69.0 in | Wt 365.0 lb

## 2013-11-04 DIAGNOSIS — D0359 Melanoma in situ of other part of trunk: Secondary | ICD-10-CM

## 2013-11-04 DIAGNOSIS — C4359 Malignant melanoma of other part of trunk: Secondary | ICD-10-CM

## 2013-11-04 NOTE — Assessment & Plan Note (Signed)
No evidence of recurrent disease.  Continue dermatology follow up q6 months.  Follow up with me in around 1 year.  Call earlier for concerns.

## 2013-11-04 NOTE — Progress Notes (Signed)
HISTORY: Pt is a 53 yo F approximately 16 months s/p WLE back melanoma in situ.  She has been going to her dermatologist office for skin survey.  They have not found any other suspicious lesions.  She has not been out in the sun significantly.  She has not noted any abnormal moles or any changes in any skin lesions.  She denies new areas with itching or ulceration.     PERTINENT REVIEW OF SYSTEMS: Otherwise negative times 11.    Filed Vitals:   11/04/13 1657  BP: 138/78  Pulse: 78  Temp: 98 F (36.7 C)  Resp: 18   @weight @  EXAM: Head: Normocephalic and atraumatic.  Eyes:  Conjunctivae are normal. Pupils are equal, round, and reactive to light. No scleral icterus.  Neck:  Normal range of motion. Neck supple. No tracheal deviation present. No thyromegaly present.  Resp: No respiratory distress, normal effort. Abd:  Abdomen is soft, non distended and non tender. No masses are palpable.  There is no rebound and no guarding.  Neurological: Alert and oriented to person, place, and time. Coordination normal.  Skin: Skin is warm and dry. No rash noted. No diaphoretic. No erythema. No pallor.  Psychiatric: Normal mood and affect. Normal behavior. Judgment and thought content normal.      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  Continue dermatology follow up q6 months.  Follow up with me in around 1 year.  Call earlier for concerns.        Milus Height, MD Surgical Oncology, [facility]  Garnet Koyanagi, DO Rosalita Chessman, DO

## 2013-11-04 NOTE — Patient Instructions (Signed)
Follow up with dermatology as scheduled for skin survey.  See me back in 1 year.

## 2013-11-24 ENCOUNTER — Ambulatory Visit (INDEPENDENT_AMBULATORY_CARE_PROVIDER_SITE_OTHER): Payer: BC Managed Care – PPO | Admitting: Family Medicine

## 2013-11-24 ENCOUNTER — Encounter: Payer: Self-pay | Admitting: Family Medicine

## 2013-11-24 VITALS — BP 136/72 | HR 65 | Temp 98.2°F | Wt 362.0 lb

## 2013-11-24 DIAGNOSIS — F411 Generalized anxiety disorder: Secondary | ICD-10-CM

## 2013-11-24 DIAGNOSIS — I10 Essential (primary) hypertension: Secondary | ICD-10-CM

## 2013-11-24 DIAGNOSIS — E785 Hyperlipidemia, unspecified: Secondary | ICD-10-CM

## 2013-11-24 NOTE — Assessment & Plan Note (Signed)
con't to inc exercise and diet

## 2013-11-24 NOTE — Patient Instructions (Signed)

## 2013-11-24 NOTE — Addendum Note (Signed)
Addended by: Modena Morrow D on: 11/24/2013 09:55 AM   Modules accepted: Orders

## 2013-11-24 NOTE — Assessment & Plan Note (Signed)
con't meds 

## 2013-11-24 NOTE — Assessment & Plan Note (Signed)
Check labs con't meds 

## 2013-11-24 NOTE — Progress Notes (Signed)
Pre visit review using our clinic review tool, if applicable. No additional management support is needed unless otherwise documented below in the visit note. 

## 2013-11-24 NOTE — Progress Notes (Signed)
  Subjective:    Patient here for follow-up of elevated blood pressure.  She is not exercising and is adherent to a low-salt diet.  Blood pressure is well controlled at home. Cardiac symptoms: none. Patient denies: chest pain, chest pressure/discomfort, claudication, dyspnea, exertional chest pressure/discomfort, fatigue, irregular heart beat, lower extremity edema, near-syncope, orthopnea, palpitations, paroxysmal nocturnal dyspnea, syncope and tachypnea. Cardiovascular risk factors: dyslipidemia, hypertension, obesity (BMI >= 30 kg/m2) and sedentary lifestyle. Use of agents associated with hypertension: none. History of target organ damage: none.  The following portions of the patient's history were reviewed and updated as appropriate: allergies, current medications, past family history, past medical history, past social history, past surgical history and problem list.  Review of Systems Pertinent items are noted in HPI.     Objective:    BP 136/72  Pulse 65  Temp(Src) 98.2 F (36.8 C) (Oral)  Wt 362 lb (164.202 kg)  SpO2 98%  LMP 06/26/2012 General appearance: alert, cooperative, appears stated age and no distress Throat: lips, mucosa, and tongue normal; teeth and gums normal Neck: no adenopathy, supple, symmetrical, trachea midline and thyroid not enlarged, symmetric, no tenderness/mass/nodules Lungs: clear to auscultation bilaterally Heart: S1, S2 normal Extremities: + edema b/l ankles   Assessment:    Hypertension, normal blood pressure . Evidence of target organ damage: none.    Plan:    Medication: no change. Dietary sodium restriction. Check blood pressures 2-3 times weekly and record. Follow up: 6 months and as needed.

## 2013-11-25 ENCOUNTER — Telehealth: Payer: Self-pay | Admitting: Family Medicine

## 2013-11-25 NOTE — Telephone Encounter (Signed)
Relevant patient education assigned to patient using Emmi. ° °

## 2013-11-26 ENCOUNTER — Other Ambulatory Visit: Payer: BC Managed Care – PPO

## 2013-11-26 LAB — HEPATIC FUNCTION PANEL
ALT: 26 U/L (ref 0–35)
AST: 21 U/L (ref 0–37)
Albumin: 4.2 g/dL (ref 3.5–5.2)
Alkaline Phosphatase: 61 U/L (ref 39–117)
Bilirubin, Direct: 0.1 mg/dL (ref 0.0–0.3)
Total Bilirubin: 0.9 mg/dL (ref 0.3–1.2)
Total Protein: 7.6 g/dL (ref 6.0–8.3)

## 2013-11-26 LAB — LIPID PANEL
CHOLESTEROL: 154 mg/dL (ref 0–200)
HDL: 52.1 mg/dL (ref >39.00)
LDL Cholesterol: 80 mg/dL (ref 0–99)
TRIGLYCERIDES: 110 mg/dL (ref 0.0–149.0)
Total CHOL/HDL Ratio: 3
VLDL: 22 mg/dL (ref 0.0–40.0)

## 2013-11-26 LAB — BASIC METABOLIC PANEL
BUN: 17 mg/dL (ref 6–23)
CHLORIDE: 104 meq/L (ref 96–112)
CO2: 28 mEq/L (ref 19–32)
CREATININE: 0.7 mg/dL (ref 0.4–1.2)
Calcium: 9.4 mg/dL (ref 8.4–10.5)
GFR: 97.69 mL/min (ref >60.00)
GLUCOSE: 98 mg/dL (ref 70–99)
POTASSIUM: 3.8 meq/L (ref 3.5–5.1)
Sodium: 141 mEq/L (ref 135–145)

## 2013-11-26 NOTE — Addendum Note (Signed)
Addended by: Modena Morrow D on: 11/26/2013 10:26 AM   Modules accepted: Orders

## 2013-12-22 ENCOUNTER — Ambulatory Visit: Payer: BC Managed Care – PPO | Admitting: *Deleted

## 2013-12-30 ENCOUNTER — Other Ambulatory Visit: Payer: Self-pay | Admitting: Family Medicine

## 2013-12-30 ENCOUNTER — Encounter: Payer: Self-pay | Admitting: Family Medicine

## 2013-12-30 DIAGNOSIS — F411 Generalized anxiety disorder: Secondary | ICD-10-CM

## 2013-12-30 MED ORDER — LORAZEPAM 0.5 MG PO TABS: 0.5000 mg | ORAL_TABLET | Freq: Three times a day (TID) | ORAL | Status: DC | PRN

## 2013-12-30 NOTE — Telephone Encounter (Signed)
Last filled 10/30/13 #90 with 1. please advise     KP

## 2014-01-14 ENCOUNTER — Encounter: Payer: Self-pay | Admitting: Family Medicine

## 2014-01-17 ENCOUNTER — Other Ambulatory Visit: Payer: Self-pay | Admitting: Family Medicine

## 2014-02-27 ENCOUNTER — Other Ambulatory Visit: Payer: Self-pay | Admitting: Family Medicine

## 2014-02-27 DIAGNOSIS — Z1231 Encounter for screening mammogram for malignant neoplasm of breast: Secondary | ICD-10-CM

## 2014-03-02 ENCOUNTER — Encounter: Payer: Self-pay | Admitting: Family Medicine

## 2014-03-02 DIAGNOSIS — F411 Generalized anxiety disorder: Secondary | ICD-10-CM

## 2014-03-02 MED ORDER — LORAZEPAM 0.5 MG PO TABS: 0.5000 mg | ORAL_TABLET | Freq: Three times a day (TID) | ORAL | Status: DC | PRN

## 2014-03-02 NOTE — Telephone Encounter (Signed)
Last seen 11/24/13 and filled 12/30/13 #90 with 1 refill. Please advise    KP

## 2014-03-28 ENCOUNTER — Other Ambulatory Visit: Payer: Self-pay | Admitting: Family Medicine

## 2014-04-24 ENCOUNTER — Ambulatory Visit (HOSPITAL_BASED_OUTPATIENT_CLINIC_OR_DEPARTMENT_OTHER)
Admission: RE | Admit: 2014-04-24 | Discharge: 2014-04-24 | Disposition: A | Discharge status: Home / Self Care | Payer: BC Managed Care – PPO | Source: Ambulatory Visit | Attending: Family Medicine | Admitting: Family Medicine

## 2014-04-24 DIAGNOSIS — Z1231 Encounter for screening mammogram for malignant neoplasm of breast: Secondary | ICD-10-CM | POA: Insufficient documentation

## 2014-05-04 ENCOUNTER — Encounter: Payer: Self-pay | Admitting: Family Medicine

## 2014-05-04 DIAGNOSIS — F411 Generalized anxiety disorder: Secondary | ICD-10-CM

## 2014-05-04 MED ORDER — LORAZEPAM 0.5 MG PO TABS: 0.5000 mg | ORAL_TABLET | Freq: Three times a day (TID) | ORAL | Status: DC | PRN

## 2014-05-04 NOTE — Telephone Encounter (Signed)
Last ov 11-24-13 Lorazepam filled 03-02-14 #90 with 1  Low risk, due for screen

## 2014-05-04 NOTE — Telephone Encounter (Signed)
Med filled and faxed.  

## 2014-05-20 ENCOUNTER — Encounter (INDEPENDENT_AMBULATORY_CARE_PROVIDER_SITE_OTHER): Payer: BC Managed Care – PPO | Admitting: Ophthalmology

## 2014-05-20 DIAGNOSIS — H43819 Vitreous degeneration, unspecified eye: Secondary | ICD-10-CM

## 2014-05-20 DIAGNOSIS — H2510 Age-related nuclear cataract, unspecified eye: Secondary | ICD-10-CM

## 2014-05-20 DIAGNOSIS — H35039 Hypertensive retinopathy, unspecified eye: Secondary | ICD-10-CM

## 2014-05-20 DIAGNOSIS — D3130 Benign neoplasm of unspecified choroid: Secondary | ICD-10-CM

## 2014-05-20 DIAGNOSIS — I10 Essential (primary) hypertension: Secondary | ICD-10-CM

## 2014-05-29 ENCOUNTER — Encounter: Payer: Self-pay | Admitting: Family Medicine

## 2014-05-29 ENCOUNTER — Ambulatory Visit (INDEPENDENT_AMBULATORY_CARE_PROVIDER_SITE_OTHER): Payer: BC Managed Care – PPO | Admitting: Family Medicine

## 2014-05-29 VITALS — BP 122/82 | HR 71 | Temp 98.4°F | Ht 68.0 in | Wt 372.0 lb

## 2014-05-29 DIAGNOSIS — Z23 Encounter for immunization: Secondary | ICD-10-CM

## 2014-05-29 DIAGNOSIS — I10 Essential (primary) hypertension: Secondary | ICD-10-CM

## 2014-05-29 DIAGNOSIS — E785 Hyperlipidemia, unspecified: Secondary | ICD-10-CM

## 2014-05-29 DIAGNOSIS — Z0000 Encounter for general adult medical examination without abnormal findings: Secondary | ICD-10-CM

## 2014-05-29 DIAGNOSIS — Z2911 Encounter for prophylactic immunotherapy for respiratory syncytial virus (RSV): Secondary | ICD-10-CM

## 2014-05-29 LAB — POCT URINALYSIS DIPSTICK
Bilirubin, UA: NEGATIVE
Blood, UA: NEGATIVE
Glucose, UA: NEGATIVE
Ketones, UA: NEGATIVE
Leukocytes, UA: NEGATIVE
Nitrite, UA: NEGATIVE
PROTEIN UA: NEGATIVE
SPEC GRAV UA: 1.01
UROBILINOGEN UA: 0.2
pH, UA: 7

## 2014-05-29 LAB — BASIC METABOLIC PANEL
BUN: 11 mg/dL (ref 6–23)
CHLORIDE: 105 meq/L (ref 96–112)
CO2: 26 meq/L (ref 19–32)
CREATININE: 0.7 mg/dL (ref 0.4–1.2)
Calcium: 9.5 mg/dL (ref 8.4–10.5)
GFR: 94.25 mL/min (ref >60.00)
GLUCOSE: 99 mg/dL (ref 70–99)
POTASSIUM: 4 meq/L (ref 3.5–5.1)
Sodium: 138 mEq/L (ref 135–145)

## 2014-05-29 LAB — CBC WITH DIFFERENTIAL/PLATELET
Basophils Absolute: 0 10*3/uL (ref 0.0–0.1)
Basophils Relative: 0.5 % (ref 0.0–3.0)
EOS ABS: 0.2 10*3/uL (ref 0.0–0.7)
EOS PCT: 2.4 % (ref 0.0–5.0)
HEMATOCRIT: 37.1 % (ref 36.0–46.0)
Hemoglobin: 12.6 g/dL (ref 12.0–15.0)
LYMPHS ABS: 1.9 10*3/uL (ref 0.7–4.0)
Lymphocytes Relative: 26.4 % (ref 12.0–46.0)
MCHC: 34 g/dL (ref 30.0–36.0)
MCV: 95.5 fl (ref 78.0–100.0)
Monocytes Absolute: 0.4 10*3/uL (ref 0.1–1.0)
Monocytes Relative: 5.7 % (ref 3.0–12.0)
Neutro Abs: 4.6 10*3/uL (ref 1.4–7.7)
Neutrophils Relative %: 65 % (ref 43.0–77.0)
PLATELETS: 189 10*3/uL (ref 150.0–400.0)
RBC: 3.88 Mil/uL (ref 3.87–5.11)
RDW: 13.3 % (ref 11.5–15.5)
WBC: 7.1 10*3/uL (ref 4.0–10.5)

## 2014-05-29 LAB — LIPID PANEL
CHOLESTEROL: 147 mg/dL (ref 0–200)
HDL: 45 mg/dL (ref >39.00)
LDL Cholesterol: 84 mg/dL (ref 0–99)
NonHDL: 102
Total CHOL/HDL Ratio: 3
Triglycerides: 91 mg/dL (ref 0.0–149.0)
VLDL: 18.2 mg/dL (ref 0.0–40.0)

## 2014-05-29 LAB — HEPATIC FUNCTION PANEL
ALT: 21 U/L (ref 0–35)
AST: 20 U/L (ref 0–37)
Albumin: 3.8 g/dL (ref 3.5–5.2)
Alkaline Phosphatase: 56 U/L (ref 39–117)
Bilirubin, Direct: 0.1 mg/dL (ref 0.0–0.3)
TOTAL PROTEIN: 6.7 g/dL (ref 6.0–8.3)
Total Bilirubin: 0.9 mg/dL (ref 0.2–1.2)

## 2014-05-29 LAB — TSH: TSH: 1.04 u[IU]/mL (ref 0.35–4.50)

## 2014-05-29 NOTE — Progress Notes (Signed)
Subjective:     Pam Phillips is a 53 y.o. female and is here for a comprehensive physical exam. The patient reports no problems.  History   Social History  . Marital Status: Married    Spouse Name: N/A    Number of Children: N/A  . 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 Smoking status: Never Smoker   . Smokeless tobacco: Never Used  . Alcohol Use: 0.6 oz/week    1 Glasses of wine per week     Comment: r-occ. social 1 wine per week  . Drug Use: No  . Sexual Activity: Yes    Partners: Male   Other Topics Concern  . Not on file   Social History Narrative   Exercise-- zumba 4-5 x a week in summer,  2-3 x during school    Health Maintenance  Topic Date Due  . Influenza Vaccine  07/29/2014 (Originally 05/16/2014)  . Pap Smear  05/07/2015  . Mammogram  04/24/2016  . Colonoscopy  08/18/2018  . Tetanus/tdap  01/07/2022    The following portions of the patient's history were reviewed and updated as appropriate:  She  has a past medical history of Anxiety; Hypertension; Hyperlipidemia; Heart murmur; Ankle swelling (07-04-12); and Cancer (07-04-12). She  does not have any pertinent problems on file. She  has past surgical history that includes Dental surgery (07-04-12); Melanoma excision (07/10/2012); Cholecystectomy (07-04-12); and Colonoscopy (N/A, 08/18/2013). Her family history includes COPD in her father; Cancer (age of onset: 19) in her father; Coronary artery disease (age of onset: 76) in an other family member; Heart disease (age of onset: 24) in her father; Hypertension in her father. There is no history of Colon cancer, Pancreatic cancer, or Stomach cancer. She  reports that she has never smoked. She has never used smokeless tobacco. She reports that she drinks about .6 ounces of alcohol per week. She reports that she does not use illicit drugs. She has a current medication list which includes the  following prescription(s): aspirin, fish oil-omega-3 fatty acids, lorazepam, lovastatin, multivitamin with minerals, polyvinyl alcohol, tolterodine, and valsartan-hydrochlorothiazide. Current Outpatient Prescriptions on File Prior to Visit  Medication Sig Dispense Refill  . aspirin 81 MG EC tablet Take 81 mg by mouth every morning.       . fish oil-omega-3 fatty acids 1000 MG capsule Take 1 g by mouth 2 (two) times daily.       Marland Kitchen LORazepam (ATIVAN) 0.5 MG tablet Take 1 tablet (0.5 mg total) by mouth every 8 (eight) hours as needed for anxiety.  90 tablet  0  . lovastatin (MEVACOR) 40 MG tablet TAKE 1 TABLET BY MOUTH EVERY NIGHT AT BEDTIME  90 tablet  0  . Multiple Vitamin (MULTIVITAMIN WITH MINERALS) TABS tablet Take 1 tablet by mouth every morning.      . polyvinyl alcohol (LIQUIFILM TEARS) 1.4 % ophthalmic solution Place 1 drop into both eyes daily as needed (For dry eyes.).      Marland Kitchen tolterodine (DETROL) 2 MG tablet TAKE 1 TABLET BY MOUTH TWICE DAILY  60 tablet  11  . valsartan-hydrochlorothiazide (DIOVAN-HCT) 320-25 MG per tablet TAKE 1 TABLET BY MOUTH ONCE DAILY  30 tablet  5  . [DISCONTINUED] tolterodine (DETROL) 2 MG tablet Take 1 tablet (2 mg total) by mouth 2 (two) times daily.  60 tablet  2   No current facility-administered medications on file prior to  visit.   She is allergic to other; codeine; and penicillins..  Review of Systems Review of Systems  Constitutional: Negative for activity change, appetite change and fatigue.  HENT: Negative for hearing loss, congestion, tinnitus and ear discharge.  dentist -- last time 2 years ago Eyes: Negative for visual disturbance (see optho q1y -- vision corrected to 20/20 with glasses).  Respiratory: Negative for cough, chest tightness and shortness of breath.   Cardiovascular: Negative for chest pain, palpitations and leg swelling.  Gastrointestinal: Negative for abdominal pain, diarrhea, constipation and abdominal distention.  Genitourinary:  Negative for urgency, frequency, decreased urine volume and difficulty urinating.  Musculoskeletal: Negative for back pain, arthralgias and gait problem.  Skin: Negative for color change, pallor and rash.  Neurological: Negative for dizziness, light-headedness, numbness and headaches.  Hematological: Negative for adenopathy. Does not bruise/bleed easily.  Psychiatric/Behavioral: Negative for suicidal ideas, confusion, sleep disturbance, self-injury, dysphoric mood, decreased concentration and agitation.       Objective:    BP 122/82  Pulse 71  Temp(Src) 98.4 F (36.9 C) (Oral)  Ht 5\' 8"  (1.727 m)  Wt 372 lb (168.738 kg)  BMI 56.58 kg/m2  SpO2 97%  LMP 06/26/2012 General appearance: alert, cooperative, appears stated age, no distress and morbidly obese Head: Normocephalic, without obvious abnormality, atraumatic Eyes: conjunctivae/corneas clear. PERRL, EOM's intact. Fundi benign. Ears: normal TM's and external ear canals both ears Nose: Nares normal. Septum midline. Mucosa normal. No drainage or sinus tenderness. Throat: lips, mucosa, and tongue normal; teeth and gums normal Neck: no adenopathy, no carotid bruit, no JVD, supple, symmetrical, trachea midline and thyroid not enlarged, symmetric, no tenderness/mass/nodules Back: symmetric, no curvature. ROM normal. No CVA tenderness. Lungs: clear to auscultation bilaterally Breasts: normal appearance, no masses or tenderness Heart: S1, S2 normal Abdomen: soft, non-tender; bowel sounds normal; no masses,  no organomegaly Pelvic: deferred Extremities: extremities normal, atraumatic, no cyanosis or edema Pulses: 2+ and symmetric Skin: Skin color, texture, turgor normal. No rashes or lesions Lymph nodes: Cervical, supraclavicular, and axillary nodes normal. Neurologic: Alert and oriented X 3, normal strength and tone. Normal symmetric reflexes. Normal coordination and gait Psych-- no depression, no anxiety      Assessment:     Healthy female exam.      Plan:    ghm utd Check labs See After Visit Summary for Counseling Recommendations   1. Essential hypertension Stable, con't meds and check labs - Basic metabolic panel - CBC with Differential - Hepatic function panel - Lipid panel - POCT urinalysis dipstick - TSH  2. Other and unspecified hyperlipidemia Check labs, con't meds - Basic metabolic panel - CBC with Differential - Hepatic function panel - Lipid panel - POCT urinalysis dipstick - TSH  3. Preventative health care   - Basic metabolic panel - CBC with Differential - Hepatic function panel - Lipid panel - POCT urinalysis dipstick - TSH  4. Need for shingles vaccine   - Varicella-zoster vaccine subcutaneous

## 2014-05-29 NOTE — Progress Notes (Signed)
Pre visit review using our clinic review tool, if applicable. No additional management support is needed unless otherwise documented below in the visit note. 

## 2014-05-29 NOTE — Patient Instructions (Signed)
Preventive Care for Adults A healthy lifestyle and preventive care can promote health and wellness. Preventive health guidelines for women include the following key practices.  A routine yearly physical is a good way to check with your health care provider about your health and preventive screening. It is a chance to share any concerns and updates on your health and to receive a thorough exam.  Visit your dentist for a routine exam and preventive care every 6 months. Brush your teeth twice a day and floss once a day. Good oral hygiene prevents tooth decay and gum disease.  The frequency of eye exams is based on your age, health, family medical history, use of contact lenses, and other factors. Follow your health care provider's recommendations for frequency of eye exams.  Eat a healthy diet. Foods like vegetables, fruits, whole grains, low-fat dairy products, and lean protein foods contain the nutrients you need without too many calories. Decrease your intake of foods high in solid fats, added sugars, and salt. Eat the right amount of calories for you.Get information about a proper diet from your health care provider, if necessary.  Regular physical exercise is one of the most important things you can do for your health. Most adults should get at least 150 minutes of moderate-intensity exercise (any activity that increases your heart rate and causes you to sweat) each week. In addition, most adults need muscle-strengthening exercises on 2 or more days a week.  Maintain a healthy weight. The body mass index (BMI) is a screening tool to identify possible weight problems. It provides an estimate of body fat based on height and weight. Your health care provider can find your BMI and can help you achieve or maintain a healthy weight.For adults 20 years and older:  A BMI below 18.5 is considered underweight.  A BMI of 18.5 to 24.9 is normal.  A BMI of 25 to 29.9 is considered overweight.  A BMI of  30 and above is considered obese.  Maintain normal blood lipids and cholesterol levels by exercising and minimizing your intake of saturated fat. Eat a balanced diet with plenty of fruit and vegetables. Blood tests for lipids and cholesterol should begin at age 76 and be repeated every 5 years. If your lipid or cholesterol levels are high, you are over 50, or you are at high risk for heart disease, you may need your cholesterol levels checked more frequently.Ongoing high lipid and cholesterol levels should be treated with medicines if diet and exercise are not working.  If you smoke, find out from your health care provider how to quit. If you do not use tobacco, do not start.  Lung cancer screening is recommended for adults aged 22-80 years who are at high risk for developing lung cancer because of a history of smoking. A yearly low-dose CT scan of the lungs is recommended for people who have at least a 30-pack-year history of smoking and are a current smoker or have quit within the past 15 years. A pack year of smoking is smoking an average of 1 pack of cigarettes a day for 1 year (for example: 1 pack a day for 30 years or 2 packs a day for 15 years). Yearly screening should continue until the smoker has stopped smoking for at least 15 years. Yearly screening should be stopped for people who develop a health problem that would prevent them from having lung cancer treatment.  If you are pregnant, do not drink alcohol. If you are breastfeeding,  be very cautious about drinking alcohol. If you are not pregnant and choose to drink alcohol, do not have more than 1 drink per day. One drink is considered to be 12 ounces (355 mL) of beer, 5 ounces (148 mL) of wine, or 1.5 ounces (44 mL) of liquor.  Avoid use of street drugs. Do not share needles with anyone. Ask for help if you need support or instructions about stopping the use of drugs.  High blood pressure causes heart disease and increases the risk of  stroke. Your blood pressure should be checked at least every 1 to 2 years. Ongoing high blood pressure should be treated with medicines if weight loss and exercise do not work.  If you are 75-52 years old, ask your health care provider if you should take aspirin to prevent strokes.  Diabetes screening involves taking a blood sample to check your fasting blood sugar level. This should be done once every 3 years, after age 15, if you are within normal weight and without risk factors for diabetes. Testing should be considered at a younger age or be carried out more frequently if you are overweight and have at least 1 risk factor for diabetes.  Breast cancer screening is essential preventive care for women. You should practice "breast self-awareness." This means understanding the normal appearance and feel of your breasts and may include breast self-examination. Any changes detected, no matter how small, should be reported to a health care provider. Women in their 58s and 30s should have a clinical breast exam (CBE) by a health care provider as part of a regular health exam every 1 to 3 years. After age 16, women should have a CBE every year. Starting at age 53, women should consider having a mammogram (breast X-ray test) every year. Women who have a family history of breast cancer should talk to their health care provider about genetic screening. Women at a high risk of breast cancer should talk to their health care providers about having an MRI and a mammogram every year.  Breast cancer gene (BRCA)-related cancer risk assessment is recommended for women who have family members with BRCA-related cancers. BRCA-related cancers include breast, ovarian, tubal, and peritoneal cancers. Having family members with these cancers may be associated with an increased risk for harmful changes (mutations) in the breast cancer genes BRCA1 and BRCA2. Results of the assessment will determine the need for genetic counseling and  BRCA1 and BRCA2 testing.  Routine pelvic exams to screen for cancer are no longer recommended for nonpregnant women who are considered low risk for cancer of the pelvic organs (ovaries, uterus, and vagina) and who do not have symptoms. Ask your health care provider if a screening pelvic exam is right for you.  If you have had past treatment for cervical cancer or a condition that could lead to cancer, you need Pap tests and screening for cancer for at least 20 years after your treatment. If Pap tests have been discontinued, your risk factors (such as having a new sexual partner) need to be reassessed to determine if screening should be resumed. Some women have medical problems that increase the chance of getting cervical cancer. In these cases, your health care provider may recommend more frequent screening and Pap tests.  The HPV test is an additional test that may be used for cervical cancer screening. The HPV test looks for the virus that can cause the cell changes on the cervix. The cells collected during the Pap test can be  tested for HPV. The HPV test could be used to screen women aged 30 years and older, and should be used in women of any age who have unclear Pap test results. After the age of 30, women should have HPV testing at the same frequency as a Pap test.  Colorectal cancer can be detected and often prevented. Most routine colorectal cancer screening begins at the age of 50 years and continues through age 75 years. However, your health care provider may recommend screening at an earlier age if you have risk factors for colon cancer. On a yearly basis, your health care provider may provide home test kits to check for hidden blood in the stool. Use of a small camera at the end of a tube, to directly examine the colon (sigmoidoscopy or colonoscopy), can detect the earliest forms of colorectal cancer. Talk to your health care provider about this at age 50, when routine screening begins. Direct  exam of the colon should be repeated every 5-10 years through age 75 years, unless early forms of pre-cancerous polyps or small growths are found.  People who are at an increased risk for hepatitis B should be screened for this virus. You are considered at high risk for hepatitis B if:  You were born in a country where hepatitis B occurs often. Talk with your health care provider about which countries are considered high risk.  Your parents were born in a high-risk country and you have not received a shot to protect against hepatitis B (hepatitis B vaccine).  You have HIV or AIDS.  You use needles to inject street drugs.  You live with, or have sex with, someone who has hepatitis B.  You get hemodialysis treatment.  You take certain medicines for conditions like cancer, organ transplantation, and autoimmune conditions.  Hepatitis C blood testing is recommended for all people born from 1945 through 1965 and any individual with known risks for hepatitis C.  Practice safe sex. Use condoms and avoid high-risk sexual practices to reduce the spread of sexually transmitted infections (STIs). STIs include gonorrhea, chlamydia, syphilis, trichomonas, herpes, HPV, and human immunodeficiency virus (HIV). Herpes, HIV, and HPV are viral illnesses that have no cure. They can result in disability, cancer, and death.  You should be screened for sexually transmitted illnesses (STIs) including gonorrhea and chlamydia if:  You are sexually active and are younger than 24 years.  You are older than 24 years and your health care provider tells you that you are at risk for this type of infection.  Your sexual activity has changed since you were last screened and you are at an increased risk for chlamydia or gonorrhea. Ask your health care provider if you are at risk.  If you are at risk of being infected with HIV, it is recommended that you take a prescription medicine daily to prevent HIV infection. This is  called preexposure prophylaxis (PrEP). You are considered at risk if:  You are a heterosexual woman, are sexually active, and are at increased risk for HIV infection.  You take drugs by injection.  You are sexually active with a partner who has HIV.  Talk with your health care provider about whether you are at high risk of being infected with HIV. If you choose to begin PrEP, you should first be tested for HIV. You should then be tested every 3 months for as long as you are taking PrEP.  Osteoporosis is a disease in which the bones lose minerals and strength   with aging. This can result in serious bone fractures or breaks. The risk of osteoporosis can be identified using a bone density scan. Women ages 65 years and over and women at risk for fractures or osteoporosis should discuss screening with their health care providers. Ask your health care provider whether you should take a calcium supplement or vitamin D to reduce the rate of osteoporosis.  Menopause can be associated with physical symptoms and risks. Hormone replacement therapy is available to decrease symptoms and risks. You should talk to your health care provider about whether hormone replacement therapy is right for you.  Use sunscreen. Apply sunscreen liberally and repeatedly throughout the day. You should seek shade when your shadow is shorter than you. Protect yourself by wearing long sleeves, pants, a wide-brimmed hat, and sunglasses year round, whenever you are outdoors.  Once a month, do a whole body skin exam, using a mirror to look at the skin on your back. Tell your health care provider of new moles, moles that have irregular borders, moles that are larger than a pencil eraser, or moles that have changed in shape or color.  Stay current with required vaccines (immunizations).  Influenza vaccine. All adults should be immunized every year.  Tetanus, diphtheria, and acellular pertussis (Td, Tdap) vaccine. Pregnant women should  receive 1 dose of Tdap vaccine during each pregnancy. The dose should be obtained regardless of the length of time since the last dose. Immunization is preferred during the 27th-36th week of gestation. An adult who has not previously received Tdap or who does not know her vaccine status should receive 1 dose of Tdap. This initial dose should be followed by tetanus and diphtheria toxoids (Td) booster doses every 10 years. Adults with an unknown or incomplete history of completing a 3-dose immunization series with Td-containing vaccines should begin or complete a primary immunization series including a Tdap dose. Adults should receive a Td booster every 10 years.  Varicella vaccine. An adult without evidence of immunity to varicella should receive 2 doses or a second dose if she has previously received 1 dose. Pregnant females who do not have evidence of immunity should receive the first dose after pregnancy. This first dose should be obtained before leaving the health care facility. The second dose should be obtained 4-8 weeks after the first dose.  Human papillomavirus (HPV) vaccine. Females aged 13-26 years who have not received the vaccine previously should obtain the 3-dose series. The vaccine is not recommended for use in pregnant females. However, pregnancy testing is not needed before receiving a dose. If a female is found to be pregnant after receiving a dose, no treatment is needed. In that case, the remaining doses should be delayed until after the pregnancy. Immunization is recommended for any person with an immunocompromised condition through the age of 26 years if she did not get any or all doses earlier. During the 3-dose series, the second dose should be obtained 4-8 weeks after the first dose. The third dose should be obtained 24 weeks after the first dose and 16 weeks after the second dose.  Zoster vaccine. One dose is recommended for adults aged 60 years or older unless certain conditions are  present.  Measles, mumps, and rubella (MMR) vaccine. Adults born before 1957 generally are considered immune to measles and mumps. Adults born in 1957 or later should have 1 or more doses of MMR vaccine unless there is a contraindication to the vaccine or there is laboratory evidence of immunity to   each of the three diseases. A routine second dose of MMR vaccine should be obtained at least 28 days after the first dose for students attending postsecondary schools, health care workers, or international travelers. People who received inactivated measles vaccine or an unknown type of measles vaccine during 1963-1967 should receive 2 doses of MMR vaccine. People who received inactivated mumps vaccine or an unknown type of mumps vaccine before 1979 and are at high risk for mumps infection should consider immunization with 2 doses of MMR vaccine. For females of childbearing age, rubella immunity should be determined. If there is no evidence of immunity, females who are not pregnant should be vaccinated. If there is no evidence of immunity, females who are pregnant should delay immunization until after pregnancy. Unvaccinated health care workers born before 1957 who lack laboratory evidence of measles, mumps, or rubella immunity or laboratory confirmation of disease should consider measles and mumps immunization with 2 doses of MMR vaccine or rubella immunization with 1 dose of MMR vaccine.  Pneumococcal 13-valent conjugate (PCV13) vaccine. When indicated, a person who is uncertain of her immunization history and has no record of immunization should receive the PCV13 vaccine. An adult aged 19 years or older who has certain medical conditions and has not been previously immunized should receive 1 dose of PCV13 vaccine. This PCV13 should be followed with a dose of pneumococcal polysaccharide (PPSV23) vaccine. The PPSV23 vaccine dose should be obtained at least 8 weeks after the dose of PCV13 vaccine. An adult aged 19  years or older who has certain medical conditions and previously received 1 or more doses of PPSV23 vaccine should receive 1 dose of PCV13. The PCV13 vaccine dose should be obtained 1 or more years after the last PPSV23 vaccine dose.  Pneumococcal polysaccharide (PPSV23) vaccine. When PCV13 is also indicated, PCV13 should be obtained first. All adults aged 65 years and older should be immunized. An adult younger than age 65 years who has certain medical conditions should be immunized. Any person who resides in a nursing home or long-term care facility should be immunized. An adult smoker should be immunized. People with an immunocompromised condition and certain other conditions should receive both PCV13 and PPSV23 vaccines. People with human immunodeficiency virus (HIV) infection should be immunized as soon as possible after diagnosis. Immunization during chemotherapy or radiation therapy should be avoided. Routine use of PPSV23 vaccine is not recommended for American Indians, [place] Natives, or people younger than 65 years unless there are medical conditions that require PPSV23 vaccine. When indicated, people who have unknown immunization and have no record of immunization should receive PPSV23 vaccine. One-time revaccination 5 years after the first dose of PPSV23 is recommended for people aged 19-64 years who have chronic kidney failure, nephrotic syndrome, asplenia, or immunocompromised conditions. People who received 1-2 doses of PPSV23 before age 65 years should receive another dose of PPSV23 vaccine at age 65 years or later if at least 5 years have passed since the previous dose. Doses of PPSV23 are not needed for people immunized with PPSV23 at or after age 65 years.  Meningococcal vaccine. Adults with asplenia or persistent complement component deficiencies should receive 2 doses of quadrivalent meningococcal conjugate (MenACWY-D) vaccine. The doses should be obtained at least 2 months apart.  Microbiologists working with certain meningococcal bacteria, military recruits, people at risk during an outbreak, and people who travel to or live in countries with a high rate of meningitis should be immunized. A first-year college student up through age   21 years who is living in a residence hall should receive a dose if she did not receive a dose on or after her 16th birthday. Adults who have certain high-risk conditions should receive one or more doses of vaccine.  Hepatitis A vaccine. Adults who wish to be protected from this disease, have certain high-risk conditions, work with hepatitis A-infected animals, work in hepatitis A research labs, or travel to or work in countries with a high rate of hepatitis A should be immunized. Adults who were previously unvaccinated and who anticipate close contact with an international adoptee during the first 60 days after arrival in the [place] from a country with a high rate of hepatitis A should be immunized.  Hepatitis B vaccine. Adults who wish to be protected from this disease, have certain high-risk conditions, may be exposed to blood or other infectious body fluids, are household contacts or sex partners of hepatitis B positive people, are clients or workers in certain care facilities, or travel to or work in countries with a high rate of hepatitis B should be immunized.  Haemophilus influenzae type b (Hib) vaccine. A previously unvaccinated person with asplenia or sickle cell disease or having a scheduled splenectomy should receive 1 dose of Hib vaccine. Regardless of previous immunization, a recipient of a hematopoietic stem cell transplant should receive a 3-dose series 6-12 months after her successful transplant. Hib vaccine is not recommended for adults with HIV infection. Preventive Services / Frequency Ages 64 to 68 years  Blood pressure check.** / Every 1 to 2 years.  Lipid and cholesterol check.** / Every 5 years beginning at age  22.  Clinical breast exam.** / Every 3 years for women in their 88s and 53s.  BRCA-related cancer risk assessment.** / For women who have family members with a BRCA-related cancer (breast, ovarian, tubal, or peritoneal cancers).  Pap test.** / Every 2 years from ages 90 through 51. Every 3 years starting at age 21 through age 56 or 3 with a history of 3 consecutive normal Pap tests.  HPV screening.** / Every 3 years from ages 24 through ages 1 to 46 with a history of 3 consecutive normal Pap tests.  Hepatitis C blood test.** / For any individual with known risks for hepatitis C.  Skin self-exam. / Monthly.  Influenza vaccine. / Every year.  Tetanus, diphtheria, and acellular pertussis (Tdap, Td) vaccine.** / Consult your health care provider. Pregnant women should receive 1 dose of Tdap vaccine during each pregnancy. 1 dose of Td every 10 years.  Varicella vaccine.** / Consult your health care provider. Pregnant females who do not have evidence of immunity should receive the first dose after pregnancy.  HPV vaccine. / 3 doses over 6 months, if 72 and younger. The vaccine is not recommended for use in pregnant females. However, pregnancy testing is not needed before receiving a dose.  Measles, mumps, rubella (MMR) vaccine.** / You need at least 1 dose of MMR if you were born in 1957 or later. You may also need a 2nd dose. For females of childbearing age, rubella immunity should be determined. If there is no evidence of immunity, females who are not pregnant should be vaccinated. If there is no evidence of immunity, females who are pregnant should delay immunization until after pregnancy.  Pneumococcal 13-valent conjugate (PCV13) vaccine.** / Consult your health care provider.  Pneumococcal polysaccharide (PPSV23) vaccine.** / 1 to 2 doses if you smoke cigarettes or if you have certain conditions.  Meningococcal vaccine.** /  1 dose if you are age 19 to 21 years and a first-year college  student living in a residence hall, or have one of several medical conditions, you need to get vaccinated against meningococcal disease. You may also need additional booster doses.  Hepatitis A vaccine.** / Consult your health care provider.  Hepatitis B vaccine.** / Consult your health care provider.  Haemophilus influenzae type b (Hib) vaccine.** / Consult your health care provider. Ages 40 to 64 years  Blood pressure check.** / Every 1 to 2 years.  Lipid and cholesterol check.** / Every 5 years beginning at age 20 years.  Lung cancer screening. / Every year if you are aged 55-80 years and have a 30-pack-year history of smoking and currently smoke or have quit within the past 15 years. Yearly screening is stopped once you have quit smoking for at least 15 years or develop a health problem that would prevent you from having lung cancer treatment.  Clinical breast exam.** / Every year after age 40 years.  BRCA-related cancer risk assessment.** / For women who have family members with a BRCA-related cancer (breast, ovarian, tubal, or peritoneal cancers).  Mammogram.** / Every year beginning at age 40 years and continuing for as long as you are in good health. Consult with your health care provider.  Pap test.** / Every 3 years starting at age 30 years through age 65 or 70 years with a history of 3 consecutive normal Pap tests.  HPV screening.** / Every 3 years from ages 30 years through ages 65 to 70 years with a history of 3 consecutive normal Pap tests.  Fecal occult blood test (FOBT) of stool. / Every year beginning at age 50 years and continuing until age 75 years. You may not need to do this test if you get a colonoscopy every 10 years.  Flexible sigmoidoscopy or colonoscopy.** / Every 5 years for a flexible sigmoidoscopy or every 10 years for a colonoscopy beginning at age 50 years and continuing until age 75 years.  Hepatitis C blood test.** / For all people born from 1945 through  1965 and any individual with known risks for hepatitis C.  Skin self-exam. / Monthly.  Influenza vaccine. / Every year.  Tetanus, diphtheria, and acellular pertussis (Tdap/Td) vaccine.** / Consult your health care provider. Pregnant women should receive 1 dose of Tdap vaccine during each pregnancy. 1 dose of Td every 10 years.  Varicella vaccine.** / Consult your health care provider. Pregnant females who do not have evidence of immunity should receive the first dose after pregnancy.  Zoster vaccine.** / 1 dose for adults aged 60 years or older.  Measles, mumps, rubella (MMR) vaccine.** / You need at least 1 dose of MMR if you were born in 1957 or later. You may also need a 2nd dose. For females of childbearing age, rubella immunity should be determined. If there is no evidence of immunity, females who are not pregnant should be vaccinated. If there is no evidence of immunity, females who are pregnant should delay immunization until after pregnancy.  Pneumococcal 13-valent conjugate (PCV13) vaccine.** / Consult your health care provider.  Pneumococcal polysaccharide (PPSV23) vaccine.** / 1 to 2 doses if you smoke cigarettes or if you have certain conditions.  Meningococcal vaccine.** / Consult your health care provider.  Hepatitis A vaccine.** / Consult your health care provider.  Hepatitis B vaccine.** / Consult your health care provider.  Haemophilus influenzae type b (Hib) vaccine.** / Consult your health care provider. Ages 65   years and over  Blood pressure check.** / Every 1 to 2 years.  Lipid and cholesterol check.** / Every 5 years beginning at age 22 years.  Lung cancer screening. / Every year if you are aged 73-80 years and have a 30-pack-year history of smoking and currently smoke or have quit within the past 15 years. Yearly screening is stopped once you have quit smoking for at least 15 years or develop a health problem that would prevent you from having lung cancer  treatment.  Clinical breast exam.** / Every year after age 4 years.  BRCA-related cancer risk assessment.** / For women who have family members with a BRCA-related cancer (breast, ovarian, tubal, or peritoneal cancers).  Mammogram.** / Every year beginning at age 40 years and continuing for as long as you are in good health. Consult with your health care provider.  Pap test.** / Every 3 years starting at age 9 years through age 34 or 91 years with 3 consecutive normal Pap tests. Testing can be stopped between 65 and 70 years with 3 consecutive normal Pap tests and no abnormal Pap or HPV tests in the past 10 years.  HPV screening.** / Every 3 years from ages 57 years through ages 64 or 45 years with a history of 3 consecutive normal Pap tests. Testing can be stopped between 65 and 70 years with 3 consecutive normal Pap tests and no abnormal Pap or HPV tests in the past 10 years.  Fecal occult blood test (FOBT) of stool. / Every year beginning at age 15 years and continuing until age 17 years. You may not need to do this test if you get a colonoscopy every 10 years.  Flexible sigmoidoscopy or colonoscopy.** / Every 5 years for a flexible sigmoidoscopy or every 10 years for a colonoscopy beginning at age 86 years and continuing until age 71 years.  Hepatitis C blood test.** / For all people born from [birth year] through 1966 and any individual with known risks for hepatitis C.  Osteoporosis screening.** / A one-time screening for women ages 83 years and over and women at risk for fractures or osteoporosis.  Skin self-exam. / Monthly.  Influenza vaccine. / Every year.  Tetanus, diphtheria, and acellular pertussis (Tdap/Td) vaccine.** / 1 dose of Td every 10 years.  Varicella vaccine.** / Consult your health care provider.  Zoster vaccine.** / 1 dose for adults aged 61 years or older.  Pneumococcal 13-valent conjugate (PCV13) vaccine.** / Consult your health care provider.  Pneumococcal  polysaccharide (PPSV23) vaccine.** / 1 dose for all adults aged 28 years and older.  Meningococcal vaccine.** / Consult your health care provider.  Hepatitis A vaccine.** / Consult your health care provider.  Hepatitis B vaccine.** / Consult your health care provider.  Haemophilus influenzae type b (Hib) vaccine.** / Consult your health care provider. ** Family history and personal history of risk and conditions may change your health care provider's recommendations. Document Released: 11/28/2001 Document Revised: 02/16/2014 Document Reviewed: 02/27/2011 [facility] Patient Information 2015 [place], [place]. This information is not intended to replace advice given to you by your health care provider. Make sure you discuss any questions you have with your health care provider.

## 2014-06-04 ENCOUNTER — Encounter: Payer: Self-pay | Admitting: Family Medicine

## 2014-06-04 DIAGNOSIS — F411 Generalized anxiety disorder: Secondary | ICD-10-CM

## 2014-06-04 MED ORDER — LORAZEPAM 0.5 MG PO TABS: 0.5000 mg | ORAL_TABLET | Freq: Three times a day (TID) | ORAL | Status: DC | PRN

## 2014-06-04 NOTE — Addendum Note (Signed)
Addended by: Ewing Schlein on: 06/04/2014 11:52 AM   Modules accepted: Orders, Medications

## 2014-06-04 NOTE — Telephone Encounter (Signed)
Last seen 05/29/14 and filled 05/04/14 #90.  Please advise    KP

## 2014-06-04 NOTE — Telephone Encounter (Signed)
Rx faxed.    KP 

## 2014-06-04 NOTE — Telephone Encounter (Signed)
Ok to renew x 1

## 2014-06-29 ENCOUNTER — Other Ambulatory Visit: Payer: Self-pay | Admitting: Family Medicine

## 2014-06-30 ENCOUNTER — Encounter: Payer: Self-pay | Admitting: Family Medicine

## 2014-07-09 ENCOUNTER — Encounter: Payer: Self-pay | Admitting: Family Medicine

## 2014-07-09 DIAGNOSIS — F411 Generalized anxiety disorder: Secondary | ICD-10-CM

## 2014-07-09 MED ORDER — LORAZEPAM 0.5 MG PO TABS: 0.5000 mg | ORAL_TABLET | Freq: Three times a day (TID) | ORAL | Status: DC | PRN

## 2014-07-09 NOTE — Telephone Encounter (Signed)
LAST SEEN 05/29/14 AND FILLED  06/04/14 #90. PLEASE ADVISE    KP

## 2014-07-09 NOTE — Telephone Encounter (Signed)
Refill x1 

## 2014-07-25 ENCOUNTER — Other Ambulatory Visit: Payer: Self-pay | Admitting: Family Medicine

## 2014-08-17 ENCOUNTER — Encounter: Payer: Self-pay | Admitting: Family Medicine

## 2014-08-17 DIAGNOSIS — F411 Generalized anxiety disorder: Secondary | ICD-10-CM

## 2014-08-17 MED ORDER — LORAZEPAM 0.5 MG PO TABS: 0.5000 mg | ORAL_TABLET | Freq: Three times a day (TID) | ORAL | Status: DC | PRN

## 2014-08-17 NOTE — Telephone Encounter (Signed)
Refill ativan x1  2 refillls

## 2014-08-17 NOTE — Telephone Encounter (Signed)
Last seen 05/29/14 and filled 07/09/14 #90 UDS 05/29/14   Please advise      KP

## 2014-08-27 ENCOUNTER — Encounter: Payer: Self-pay | Admitting: Family Medicine

## 2014-08-27 NOTE — Telephone Encounter (Signed)
prob needs ov

## 2014-08-28 ENCOUNTER — Encounter: Payer: Self-pay | Admitting: Family Medicine

## 2014-08-28 NOTE — Telephone Encounter (Signed)
Needs ov

## 2014-08-31 ENCOUNTER — Ambulatory Visit (INDEPENDENT_AMBULATORY_CARE_PROVIDER_SITE_OTHER): Payer: BC Managed Care – PPO | Admitting: Medical

## 2014-08-31 ENCOUNTER — Encounter: Payer: Self-pay | Admitting: Medical

## 2014-08-31 VITALS — BP 152/95 | HR 67 | Temp 98.1°F | Ht 68.2 in | Wt 369.4 lb

## 2014-08-31 DIAGNOSIS — J069 Acute upper respiratory infection, unspecified: Secondary | ICD-10-CM

## 2014-08-31 MED ORDER — FLUTICASONE PROPIONATE 50 MCG/ACT NA SUSP: 2.0000 | Freq: Every day | NASAL | Status: DC

## 2014-08-31 MED ORDER — BENZONATATE 200 MG PO CAPS: 200.0000 mg | ORAL_CAPSULE | Freq: Three times a day (TID) | ORAL | Status: DC | PRN

## 2014-08-31 MED ORDER — AZITHROMYCIN 250 MG PO TABS: ORAL_TABLET | ORAL | Status: DC

## 2014-08-31 NOTE — Assessment & Plan Note (Addendum)
Uri vs allergic rhinitis. I will prescribe benzonatate for cough and fluticasone for nasal congestion. If your symptoms worse with productive cough or obvious fever (indicating bronchitis) then go ahead and start azithromycin

## 2014-08-31 NOTE — Patient Instructions (Addendum)
.  Currently, you symptoms appear to be  allergic rhinitis verses uri. I will prescribe benzonatate for cough and fluticasone for nasal congestion. If your symptoms worse with productive cough or obvious fever (indicating bronchitis) then go ahead and start azithromycin.

## 2014-08-31 NOTE — Progress Notes (Signed)
Subjective:    Patient ID: Pam Phillips, female    DOB: 30-Jul-1961, 53 y.o.   MRN: [id number]  HPI   Pt in with cough and cold for about 1 wk. Yesterday over weekend got worse. Some fever yesterday. Pt having coughing fits(mostly dry cough). Temp yesterday 99.8. Pt denies any history of asthma.No smoking history. Pt reports no obvious allergy type signs or symptoms. Pt does work in [employer]. 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 She has a lot of contact. Direct contact kids will get in her face when they come to her office reporing illness.  Pt has no wheezing with cough.  Past Medical History  Diagnosis Date  . Anxiety   . Hypertension   . Hyperlipidemia   . Heart murmur   . Ankle swelling 07-04-12    mostly left ankle -wears compression hose occ.  . Cancer 07-04-12    dx. Melanoma in situ -back(mid-scapula area)    History   Social History  . Marital Status: Married    Spouse Name: N/A    Number of Children: N/A  . 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 Smoking status: Never Smoker   . Smokeless tobacco: Never Used  . Alcohol Use: 0.6 oz/week    1 Glasses of wine per week     Comment: r-occ. social 1 wine per week  . Drug Use: No  . Sexual Activity:    Partners: Male   Other Topics Concern  . Not on file   Social History Narrative   Exercise-- zumba 4-5 x a week in summer,  2-3 x during school     Past Surgical History  Procedure Laterality Date  . Dental surgery  07-04-12    2 wisdom teeth and 2 other teeth  . Melanoma excision  07/10/2012    Procedure: MELANOMA EXCISION;  Surgeon: Stark Klein, MD;  Location: [facility];  Service: General;  Laterality: N/A;  excision of back melanoma insitu  . Cholecystectomy  07-04-12    '03-Lap. due to gallstones-[place]  . Colonoscopy N/A 08/18/2013    Procedure: COLONOSCOPY;  Surgeon: Jerene Bears, MD;  Location: [facility];   Service: Gastroenterology;  Laterality: N/A;    Family History  Problem Relation Age of Onset  . Coronary artery disease  84    1st degree female  . Hypertension Father   . COPD Father   . Heart disease Father 6    MI  . Cancer Father 60    prostate ca  . Colon cancer Neg Hx   . Pancreatic cancer Neg Hx   . Stomach cancer Neg Hx     Allergies  Allergen Reactions  . Other Swelling    Peaches(fresh) causes throat itching-swelling  . Codeine     Nausea & vomiting  . Penicillins     angioedema    Current Outpatient Prescriptions on File Prior to Visit  Medication Sig Dispense Refill  . aspirin 81 MG EC tablet Take 81 mg by mouth every morning.     . fish oil-omega-3 fatty acids 1000 MG capsule Take 1 g by mouth 2 (two) times daily.     Marland Kitchen LORazepam (ATIVAN) 0.5 MG tablet Take 1 tablet (0.5 mg total) by mouth every 8 (eight) hours as needed for anxiety. 90 tablet 2  . lovastatin (MEVACOR) 40 MG tablet TAKE 1 TABLET BY MOUTH EVERY NIGHT AT  BEDTIME 90 tablet 1  . Multiple Vitamin (MULTIVITAMIN WITH MINERALS) TABS tablet Take 1 tablet by mouth every morning.    . polyvinyl alcohol (LIQUIFILM TEARS) 1.4 % ophthalmic solution Place 1 drop into both eyes daily as needed (For dry eyes.).    Marland Kitchen tolterodine (DETROL) 2 MG tablet TAKE 1 TABLET BY MOUTH TWICE DAILY 60 tablet 11  . valsartan-hydrochlorothiazide (DIOVAN-HCT) 320-25 MG per tablet TAKE 1 TABLET BY MOUTH DAILY 30 tablet 5  . [DISCONTINUED] tolterodine (DETROL) 2 MG tablet Take 1 tablet (2 mg total) by mouth 2 (two) times daily. 60 tablet 2   No current facility-administered medications on file prior to visit.    BP 152/95 mmHg  Pulse 67  Temp(Src) 98.1 F (36.7 C) (Oral)  Ht 5' 8.2" (1.732 m)  Wt 369 lb 6.4 oz (167.559 kg)  BMI 55.86 kg/m2  SpO2 95%  LMP 06/26/2012      Review of Systems  Constitutional: Positive for fever. Negative for chills and fatigue.       Maybe low grade yesterday.  HENT: Positive for  congestion. Negative for ear discharge, ear pain, nosebleeds, postnasal drip, rhinorrhea, sinus pressure, sore throat and trouble swallowing.   Respiratory: Positive for cough. Negative for chest tightness, shortness of breath and wheezing.        Maybe early chest congestion. Feels like needs to bring up mucous but does not.  Cardiovascular: Negative for chest pain and palpitations.  Gastrointestinal: Negative for nausea, vomiting, abdominal pain, diarrhea and constipation.  Genitourinary: Negative for dysuria and flank pain.  Musculoskeletal: Negative for back pain.  Neurological: Negative for dizziness, tremors, seizures, syncope, weakness, light-headedness, numbness and headaches.  Hematological: Negative for adenopathy. Does not bruise/bleed easily.  Psychiatric/Behavioral: Negative for suicidal ideas, behavioral problems and dysphoric mood. The patient is not nervous/anxious.        Objective:   Physical Exam  General  Mental Status - Alert. General Appearance - Well groomed. Not in acute distress.  Skin Rashes- No Rashes.  HEENT Head- Normal. Ear Auditory Canal - Left- Normal. Right - Normal.Tympanic Membrane- Left- Normal. Right- Normal. Eye Sclera/Conjunctiva- Left- Normal. Right- Normal. Nose & Sinuses Nasal Mucosa- Left-  Boggy + Congested. Right- Boggy + Congested. Mouth & Throat Lips: Upper Lip- Normal: no dryness, cracking, pallor, cyanosis, or vesicular eruption. Lower Lip-Normal: no dryness, cracking, pallor, cyanosis or vesicular eruption. Buccal Mucosa- Bilateral- No Aphthous ulcers. Oropharynx- No Discharge or Erythema. A lot of post nasal drainage. Tonsils: Characteristics- Bilateral- No Erythema or Congestion. Size/Enlargement- Bilateral- No enlargement. Discharge- bilateral-None.  Neck Neck- Supple. No Masses.   Chest and Lung Exam Auscultation: Breath Sounds:-Normal. CTA.  Cardiovascular Auscultation:Rythm- Regular, Rate and Rhythm.  Murmurs & Other  Heart Sounds:Ausculatation of the heart reveal- No Murmurs.  Lymphatic Head & Neck General Head & Neck Lymphatics: Bilateral: Description- No Localized lymphadenopathy.        Assessment & Plan:

## 2014-08-31 NOTE — Progress Notes (Signed)
Pre visit review using our clinic review tool, if applicable. No additional management support is needed unless otherwise documented below in the visit note. 

## 2014-09-01 ENCOUNTER — Ambulatory Visit: Payer: Self-pay | Admitting: Family Medicine

## 2014-11-16 ENCOUNTER — Encounter: Payer: Self-pay | Admitting: Family Medicine

## 2014-11-16 DIAGNOSIS — F411 Generalized anxiety disorder: Secondary | ICD-10-CM

## 2014-11-16 MED ORDER — LORAZEPAM 0.5 MG PO TABS: 0.5000 mg | ORAL_TABLET | Freq: Three times a day (TID) | ORAL | Status: DC | PRN

## 2014-11-16 NOTE — Telephone Encounter (Signed)
Refill x1   2 refills 

## 2014-11-16 NOTE — Telephone Encounter (Signed)
Last seen 08/31/14 and filled 08/17/14 #90 with 2 refills.  UDS 09/25/14 Low risk Please advise      KP

## 2014-11-30 ENCOUNTER — Ambulatory Visit: Payer: BC Managed Care – PPO | Admitting: Family Medicine

## 2014-12-08 ENCOUNTER — Ambulatory Visit: Payer: BC Managed Care – PPO | Admitting: Family Medicine

## 2014-12-11 ENCOUNTER — Encounter: Payer: Self-pay | Admitting: Family Medicine

## 2014-12-11 ENCOUNTER — Ambulatory Visit (INDEPENDENT_AMBULATORY_CARE_PROVIDER_SITE_OTHER): Payer: BC Managed Care – PPO | Admitting: Family Medicine

## 2014-12-11 VITALS — BP 120/77 | HR 68 | Temp 98.2°F | Wt 368.4 lb

## 2014-12-11 DIAGNOSIS — E785 Hyperlipidemia, unspecified: Secondary | ICD-10-CM

## 2014-12-11 DIAGNOSIS — Z0000 Encounter for general adult medical examination without abnormal findings: Secondary | ICD-10-CM

## 2014-12-11 DIAGNOSIS — I10 Essential (primary) hypertension: Secondary | ICD-10-CM

## 2014-12-11 LAB — BASIC METABOLIC PANEL
BUN: 12 mg/dL (ref 6–23)
CO2: 31 meq/L (ref 19–32)
Calcium: 9.4 mg/dL (ref 8.4–10.5)
Chloride: 103 mEq/L (ref 96–112)
Creatinine, Ser: 0.69 mg/dL (ref 0.40–1.20)
GFR: 94.06 mL/min (ref >60.00)
GLUCOSE: 94 mg/dL (ref 70–99)
POTASSIUM: 3.6 meq/L (ref 3.5–5.1)
Sodium: 139 mEq/L (ref 135–145)

## 2014-12-11 LAB — LIPID PANEL
Cholesterol: 153 mg/dL (ref 0–200)
HDL: 50 mg/dL (ref >39.00)
LDL CALC: 86 mg/dL (ref 0–99)
NONHDL: 103
Total CHOL/HDL Ratio: 3
Triglycerides: 83 mg/dL (ref 0.0–149.0)
VLDL: 16.6 mg/dL (ref 0.0–40.0)

## 2014-12-11 LAB — HEPATIC FUNCTION PANEL
ALBUMIN: 4.3 g/dL (ref 3.5–5.2)
ALT: 24 U/L (ref 0–35)
AST: 20 U/L (ref 0–37)
Alkaline Phosphatase: 67 U/L (ref 39–117)
Bilirubin, Direct: 0.2 mg/dL (ref 0.0–0.3)
Total Bilirubin: 0.9 mg/dL (ref 0.2–1.2)
Total Protein: 7.3 g/dL (ref 6.0–8.3)

## 2014-12-11 LAB — HIV ANTIBODY (ROUTINE TESTING W REFLEX): HIV: NONREACTIVE

## 2014-12-11 NOTE — Patient Instructions (Signed)

## 2014-12-11 NOTE — Progress Notes (Signed)
Subjective:    Patient here for follow-up of elevated blood pressure.  She is not exercising but will start again and is adherent to a low-salt diet.  Blood pressure is well controlled at home. Cardiac symptoms: none. Patient denies: chest pain, chest pressure/discomfort, claudication, dyspnea, exertional chest pressure/discomfort, fatigue, irregular heart beat, lower extremity edema, near-syncope, orthopnea, palpitations, paroxysmal nocturnal dyspnea, syncope and tachypnea. Cardiovascular risk factors: dyslipidemia, hypertension, obesity (BMI >= 30 kg/m2) and sedentary lifestyle. Use of agents associated with hypertension: none. History of target organ damage: none.  The following portions of the patient's history were reviewed and updated as appropriate:  She  has a past medical history of Anxiety; Hypertension; Hyperlipidemia; Heart murmur; Ankle swelling (07-04-12); and Cancer (07-04-12). She  does not have any pertinent problems on file. She  has past surgical history that includes Dental surgery (07-04-12); Melanoma excision (07/10/2012); Cholecystectomy (07-04-12); and Colonoscopy (N/A, 08/18/2013). Her family history includes COPD in her father; Cancer (age of onset: 37) in her father; Coronary artery disease (age of onset: 8) in an other family member; Heart disease (age of onset: 77) in her father; Hypertension in her father. There is no history of Colon cancer, Pancreatic cancer, or Stomach cancer. She  reports that she has never smoked. She has never used smokeless tobacco. She reports that she drinks about 0.6 oz of alcohol per week. She reports that she does not use illicit drugs. She has a current medication list which includes the following prescription(s): aspirin, fish oil-omega-3 fatty acids, fluticasone, lorazepam, lovastatin, multivitamin with minerals, polyvinyl alcohol, tolterodine, and valsartan-hydrochlorothiazide. Current Outpatient Prescriptions on File Prior to Visit  Medication  Sig Dispense Refill  . aspirin 81 MG EC tablet Take 81 mg by mouth every morning.     . fish oil-omega-3 fatty acids 1000 MG capsule Take 1 g by mouth 2 (two) times daily.     . fluticasone (FLONASE) 50 MCG/ACT nasal spray Place 2 sprays into both nostrils daily. 16 g 1  . LORazepam (ATIVAN) 0.5 MG tablet Take 1 tablet (0.5 mg total) by mouth every 8 (eight) hours as needed for anxiety. 90 tablet 2  . lovastatin (MEVACOR) 40 MG tablet TAKE 1 TABLET BY MOUTH EVERY NIGHT AT BEDTIME 90 tablet 1  . Multiple Vitamin (MULTIVITAMIN WITH MINERALS) TABS tablet Take 1 tablet by mouth every morning.    . polyvinyl alcohol (LIQUIFILM TEARS) 1.4 % ophthalmic solution Place 1 drop into both eyes daily as needed (For dry eyes.).    Marland Kitchen tolterodine (DETROL) 2 MG tablet TAKE 1 TABLET BY MOUTH TWICE DAILY 60 tablet 11  . valsartan-hydrochlorothiazide (DIOVAN-HCT) 320-25 MG per tablet TAKE 1 TABLET BY MOUTH DAILY 30 tablet 5  . [DISCONTINUED] tolterodine (DETROL) 2 MG tablet Take 1 tablet (2 mg total) by mouth 2 (two) times daily. 60 tablet 2   No current facility-administered medications on file prior to visit.   She is allergic to other; codeine; and penicillins..  Review of Systems Pertinent items are noted in HPI.     Objective:    BP 120/77 mmHg  Pulse 68  Temp(Src) 98.2 F (36.8 C) (Oral)  Wt 368 lb 6.4 oz (167.105 kg)  SpO2 98%  LMP 06/26/2012 General appearance: alert, cooperative, appears stated age and no distress Head: Normocephalic, without obvious abnormality, atraumatic Neck: no adenopathy, no carotid bruit, no JVD, supple, symmetrical, trachea midline and thyroid not enlarged, symmetric, no tenderness/mass/nodules Lungs: clear to auscultation bilaterally Heart: S1, S2 normal Extremities: edema no change  Assessment:    Hypertension, normal blood pressure . Evidence of target organ damage: none.    Plan:    Medication: no change. Dietary sodium restriction. Regular aerobic  exercise. Check blood pressures 2-3 times weekly and record. Follow up: 6 months and as needed.    1. Hyperlipidemia con't lovastatin Check labs - Hepatic function panel - Lipid panel  2. Essential hypertension Stable con't diovan - Basic metabolic panel  3. Preventative health care   - HIV antibody

## 2014-12-11 NOTE — Progress Notes (Signed)
Pre visit review using our clinic review tool, if applicable. No additional management support is needed unless otherwise documented below in the visit note. 

## 2015-01-03 ENCOUNTER — Other Ambulatory Visit: Payer: Self-pay | Admitting: Family Medicine

## 2015-01-16 ENCOUNTER — Other Ambulatory Visit: Payer: Self-pay | Admitting: Family Medicine

## 2015-02-16 ENCOUNTER — Encounter: Payer: Self-pay | Admitting: Family Medicine

## 2015-02-16 DIAGNOSIS — F411 Generalized anxiety disorder: Secondary | ICD-10-CM

## 2015-02-16 MED ORDER — LORAZEPAM 0.5 MG PO TABS: 0.5000 mg | ORAL_TABLET | Freq: Three times a day (TID) | ORAL | Status: DC | PRN

## 2015-02-16 NOTE — Telephone Encounter (Signed)
Last seen 12/11/14 and filled 11/16/14 #90 with 2 refills   Please advise     KP

## 2015-02-16 NOTE — Telephone Encounter (Signed)
Ok to refill x1  2 refills 

## 2015-03-24 ENCOUNTER — Other Ambulatory Visit: Payer: Self-pay | Admitting: Family Medicine

## 2015-03-24 DIAGNOSIS — Z1231 Encounter for screening mammogram for malignant neoplasm of breast: Secondary | ICD-10-CM

## 2015-04-03 ENCOUNTER — Other Ambulatory Visit: Payer: Self-pay | Admitting: Family Medicine

## 2015-04-08 ENCOUNTER — Telehealth: Payer: BC Managed Care – PPO | Admitting: Physician Assistant

## 2015-04-08 DIAGNOSIS — J208 Acute bronchitis due to other specified organisms: Secondary | ICD-10-CM

## 2015-04-08 MED ORDER — BENZONATATE 100 MG PO CAPS: 100.0000 mg | ORAL_CAPSULE | Freq: Two times a day (BID) | ORAL | Status: DC | PRN

## 2015-04-08 NOTE — Progress Notes (Signed)
We are sorry that you are not feeling well.  Here is how we plan to help!  Based on what you have shared with me it looks like you have upper respiratory tract inflammation that has resulted in a significant cough.  Inflammation and infection in the upper respiratory tract is commonly called bronchitis and has four common causes:  Allergies, Viral Infections, Acid Reflux and Bacterial Infections.  Allergies, viruses and acid reflux are treated by controlling symptoms or eliminating the cause. An example might be a cough caused by taking certain blood pressure medications. You stop the cough by changing the medication. Another example might be a cough caused by acid reflux. Controlling the reflux helps control the cough.  Based on your presentation I believe you most likely have A cough due to a virus.  This is called viral bronchitis and is best treated by rest, plenty of fluids and control of the cough.  You may use Ibuprofen or Tylenol as directed to help your symptoms.    In addition you may use A prescription cough medication called Tessalon Perles 100mg . You may take 1-2 capsules every 8 hours as needed for your cough.    HOME CARE . Only take medications as instructed by your medical team. . Complete the entire course of an antibiotic. . Drink plenty of fluids and get plenty of rest. . Avoid close contacts especially the very young and the elderly . Cover your mouth if you cough or cough into your sleeve. . Always remember to wash your hands . A steam or ultrasonic humidifier can help congestion.    GET HELP RIGHT AWAY IF: . You develop worsening fever. . You become short of breath . You cough up blood. . Your symptoms persist after you have completed your treatment plan MAKE SURE YOU   Understand these instructions.  Will watch your condition.  Will get help right away if you are not doing well or get worse.  Your e-visit answers were reviewed by a board certified advanced  clinical practitioner to complete your personal care plan.  Depending on the condition, your plan could have included both over the counter or prescription medications.  If there is a problem please reply  once you have received a response from your provider.  Your safety is important to [place].  If you have drug allergies check your prescription carefully.    You can use MyChart to ask questions about today's visit, request a non-urgent call back, or ask for a work or school excuse.  You will get an e-mail in the next two days asking about your experience.  I hope that your e-visit has been valuable and will speed your recovery. Thank you for using e-visits.

## 2015-04-26 ENCOUNTER — Ambulatory Visit (HOSPITAL_BASED_OUTPATIENT_CLINIC_OR_DEPARTMENT_OTHER)
Admission: RE | Admit: 2015-04-26 | Discharge: 2015-04-26 | Disposition: A | Discharge status: Home / Self Care | Payer: BC Managed Care – PPO | Source: Ambulatory Visit | Attending: Family Medicine | Admitting: Family Medicine

## 2015-04-26 DIAGNOSIS — Z1231 Encounter for screening mammogram for malignant neoplasm of breast: Secondary | ICD-10-CM | POA: Insufficient documentation

## 2015-05-18 ENCOUNTER — Encounter: Payer: Self-pay | Admitting: Family Medicine

## 2015-05-18 DIAGNOSIS — F411 Generalized anxiety disorder: Secondary | ICD-10-CM

## 2015-05-18 MED ORDER — LORAZEPAM 0.5 MG PO TABS: 0.5000 mg | ORAL_TABLET | Freq: Three times a day (TID) | ORAL | Status: DC | PRN

## 2015-05-18 NOTE — Telephone Encounter (Signed)
Refill ativan with 2 refills

## 2015-05-18 NOTE — Telephone Encounter (Signed)
Last seen 02/16/15 #90 with 2 refills. Please advise     KP

## 2015-05-28 ENCOUNTER — Ambulatory Visit (INDEPENDENT_AMBULATORY_CARE_PROVIDER_SITE_OTHER): Payer: BC Managed Care – PPO | Admitting: Ophthalmology

## 2015-05-28 DIAGNOSIS — H35033 Hypertensive retinopathy, bilateral: Secondary | ICD-10-CM

## 2015-05-28 DIAGNOSIS — D3132 Benign neoplasm of left choroid: Secondary | ICD-10-CM | POA: Diagnosis not present

## 2015-05-28 DIAGNOSIS — H43813 Vitreous degeneration, bilateral: Secondary | ICD-10-CM | POA: Diagnosis not present

## 2015-05-28 DIAGNOSIS — I10 Essential (primary) hypertension: Secondary | ICD-10-CM

## 2015-05-28 DIAGNOSIS — H2513 Age-related nuclear cataract, bilateral: Secondary | ICD-10-CM | POA: Diagnosis not present

## 2015-06-03 ENCOUNTER — Telehealth: Payer: Self-pay | Admitting: Family Medicine

## 2015-06-03 NOTE — Telephone Encounter (Signed)
pre visit letter mailed 05/20/15 °

## 2015-06-04 ENCOUNTER — Encounter: Payer: BC Managed Care – PPO | Admitting: Family Medicine

## 2015-06-10 ENCOUNTER — Encounter: Payer: BC Managed Care – PPO | Admitting: Family Medicine

## 2015-07-17 ENCOUNTER — Other Ambulatory Visit: Payer: Self-pay | Admitting: Family Medicine

## 2015-07-26 ENCOUNTER — Other Ambulatory Visit (HOSPITAL_COMMUNITY)
Admission: RE | Admit: 2015-07-26 | Discharge: 2015-07-26 | Disposition: A | Discharge status: Home / Self Care | Payer: BC Managed Care – PPO | Source: Ambulatory Visit | Attending: Family Medicine | Admitting: Family Medicine

## 2015-07-26 ENCOUNTER — Encounter: Payer: Self-pay | Admitting: Family Medicine

## 2015-07-26 ENCOUNTER — Ambulatory Visit (INDEPENDENT_AMBULATORY_CARE_PROVIDER_SITE_OTHER): Payer: BC Managed Care – PPO | Admitting: Family Medicine

## 2015-07-26 VITALS — BP 120/76 | HR 64 | Temp 98.8°F | Ht 68.0 in | Wt 366.4 lb

## 2015-07-26 DIAGNOSIS — Z0000 Encounter for general adult medical examination without abnormal findings: Secondary | ICD-10-CM

## 2015-07-26 DIAGNOSIS — Z124 Encounter for screening for malignant neoplasm of cervix: Secondary | ICD-10-CM

## 2015-07-26 DIAGNOSIS — Z01419 Encounter for gynecological examination (general) (routine) without abnormal findings: Secondary | ICD-10-CM | POA: Diagnosis present

## 2015-07-26 DIAGNOSIS — I10 Essential (primary) hypertension: Secondary | ICD-10-CM | POA: Diagnosis not present

## 2015-07-26 DIAGNOSIS — Z1151 Encounter for screening for human papillomavirus (HPV): Secondary | ICD-10-CM | POA: Insufficient documentation

## 2015-07-26 DIAGNOSIS — B354 Tinea corporis: Secondary | ICD-10-CM

## 2015-07-26 DIAGNOSIS — E785 Hyperlipidemia, unspecified: Secondary | ICD-10-CM

## 2015-07-26 DIAGNOSIS — Z23 Encounter for immunization: Secondary | ICD-10-CM

## 2015-07-26 MED ORDER — NYSTATIN 100000 UNIT/GM EX POWD: CUTANEOUS | Status: DC

## 2015-07-26 NOTE — Progress Notes (Signed)
Subjective:     Pam Phillips is a 54 y.o. female and is here for a comprehensive physical exam. The patient reports no problems.  Social History   Social History  . Marital Status: Married    Spouse Name: N/A  . Number of Children: N/A  . 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 Smoking status: Never Smoker   . Smokeless tobacco: Never Used  . Alcohol Use: 0.6 oz/week    1 Glasses of wine per week     Comment: r-occ. social 1 wine per week  . Drug Use: No  . Sexual Activity:    Partners: Male   Other Topics Concern  . Not on file   Social History Narrative   Exercise-- zumba 4-5 x a week in summer,  2-3 x during school    Health Maintenance  Topic Date Due  . Hepatitis C Screening  [birth year]  . PAP SMEAR  05/07/2015  . INFLUENZA VACCINE  05/16/2016  . MAMMOGRAM  04/25/2017  . COLONOSCOPY  08/18/2018  . TETANUS/TDAP  01/07/2022  . HIV Screening  Completed    The following portions of the patient's history were reviewed and updated as appropriate:  She  has a past medical history of Anxiety; Hypertension; Hyperlipidemia; Heart murmur; Ankle swelling (07-04-12); and Cancer ([place]) (07-04-12). She  does not have any pertinent problems on file. She  has past surgical history that includes Dental surgery (07-04-12); Melanoma excision (07/10/2012); Cholecystectomy (07-04-12); and Colonoscopy (N/A, 08/18/2013). Her family history includes COPD in her father; Cancer (age of onset: 30) in her father; Coronary artery disease (age of onset: 76) in an other family member; Heart disease (age of onset: 73) in her father; Hypertension in her father. There is no history of Colon cancer, Pancreatic cancer, or Stomach cancer. She  reports that she has never smoked. She has never used smokeless tobacco. She reports that she drinks about 0.6 oz of alcohol per week. She reports that she does not use illicit drugs. She  has a current medication list which includes the following prescription(s): aspirin, fish oil-omega-3 fatty acids, fluticasone, lorazepam, lovastatin, multivitamin with minerals, polyvinyl alcohol, tolterodine, valsartan-hydrochlorothiazide, and nystatin. Current Outpatient Prescriptions on File Prior to Visit  Medication Sig Dispense Refill  . aspirin 81 MG EC tablet Take 81 mg by mouth every morning.     . fish oil-omega-3 fatty acids 1000 MG capsule Take 1 g by mouth 2 (two) times daily.     . fluticasone (FLONASE) 50 MCG/ACT nasal spray Place 2 sprays into both nostrils daily. 16 g 1  . LORazepam (ATIVAN) 0.5 MG tablet Take 1 tablet (0.5 mg total) by mouth every 8 (eight) hours as needed for anxiety. 90 tablet 2  . lovastatin (MEVACOR) 40 MG tablet TAKE 1 TABLET BY MOUTH EVERY NIGHT AT BEDTIME 90 tablet 1  . Multiple Vitamin (MULTIVITAMIN WITH MINERALS) TABS tablet Take 1 tablet by mouth every morning.    . polyvinyl alcohol (LIQUIFILM TEARS) 1.4 % ophthalmic solution Place 1 drop into both eyes daily as needed (For dry eyes.).    Marland Kitchen tolterodine (DETROL) 2 MG tablet TAKE 1 TABLET BY MOUTH TWICE DAILY 60 tablet 11  . valsartan-hydrochlorothiazide (DIOVAN-HCT) 320-25 MG tablet TAKE 1 TABLET BY MOUTH EVERY DAY 30 tablet 5  . [DISCONTINUED] tolterodine (DETROL) 2 MG tablet Take 1 tablet (2 mg total) by mouth 2 (two) times daily. [place]  tablet 2   No current facility-administered medications on file prior to visit.   She is allergic to other; codeine; and penicillins..  Review of Systems Review of Systems  Constitutional: Negative for activity change, appetite change and fatigue.  HENT: Negative for hearing loss, congestion, tinnitus and ear discharge.  dentist q22m Eyes: Negative for visual disturbance (see optho q1y -- vision corrected to 20/20 with glasses).  Respiratory: Negative for cough, chest tightness and shortness of breath.   Cardiovascular: Negative for chest pain, palpitations and leg  swelling.  Gastrointestinal: Negative for abdominal pain, diarrhea, constipation and abdominal distention.  Genitourinary: Negative for urgency, frequency, decreased urine volume and difficulty urinating.  Musculoskeletal: Negative for back pain, arthralgias and gait problem.  Skin: Negative for color change, pallor and rash.  Neurological: Negative for dizziness, light-headedness, numbness and headaches.  Hematological: Negative for adenopathy. Does not bruise/bleed easily.  Psychiatric/Behavioral: Negative for suicidal ideas, confusion, sleep disturbance, self-injury, dysphoric mood, decreased concentration and agitation.       Objective:    BP 120/76 mmHg  Pulse 64  Temp(Src) 98.8 F (37.1 C) (Oral)  Ht $R'5\' 8"'Io$  (1.727 m)  Wt 366 lb 6.4 oz (166.198 kg)  BMI 55.72 kg/m2  SpO2 99%  LMP 06/26/2012 General appearance: alert, cooperative, appears stated age and no distress Head: Normocephalic, without obvious abnormality, atraumatic Eyes: conjunctivae/corneas clear. PERRL, EOM's intact. Fundi benign. Ears: normal TM's and external ear canals both ears Nose: Nares normal. Septum midline. Mucosa normal. No drainage or sinus tenderness. Throat: lips, mucosa, and tongue normal; teeth and gums normal Neck: no adenopathy, no carotid bruit, no JVD, supple, symmetrical, trachea midline and thyroid not enlarged, symmetric, no tenderness/mass/nodules Back: symmetric, no curvature. ROM normal. No CVA tenderness. Lungs: clear to auscultation bilaterally Breasts: normal appearance, no masses or tenderness Heart: regular rate and rhythm, S1, S2 normal, no murmur, click, rub or gallop Abdomen: soft, non-tender; bowel sounds normal; no masses,  no organomegaly Pelvic: cervix normal in appearance, external genitalia normal, no adnexal masses or tenderness, no cervical motion tenderness, rectovaginal septum normal, uterus normal size, shape, and consistency, vagina normal without discharge and pap done,     rectal--heme+ brown stool Extremities: extremities normal, atraumatic, no cyanosis or edema Pulses: 2+ and symmetric Skin: Skin color, texture, turgor normal. No rashes or lesions Lymph nodes: Cervical, supraclavicular, and axillary nodes normal. Neurologic: Alert and oriented X 3, normal strength and tone. Normal symmetric reflexes. Normal coordination and gait Psych- no depression, no anxiety      Assessment:    Healthy female exam.       Plan:    ghm utd Check labs See After Visit Summary for Counseling Recommendations    1. Need for immunization against influenza   - Flu Vaccine QUAD 36+ mos IM (Fluarix)  2. Tinea corporis   - nystatin (MYCOSTATIN/NYSTOP) 100000 UNIT/GM POWD; Apply tid prn  Dispense: 60 g; Refill: 3  3. Hyperlipidemia Lovastatin Check labss - Comp Met (CMET); Future - Lipid panel; Future  4. Preventative health care   - Comp Met (CMET); Future - CBC with Differential/Platelet; Future - Lipid panel; Future - Microalbumin / creatinine urine ratio; Future - POCT urinalysis dipstick; Future - TSH; Future - Cytology - PAP  5. Essential hypertension diovan hct---stable - Comp Met (CMET); Future - CBC with Differential/Platelet; Future - Microalbumin / creatinine urine ratio; Future  6. Screening for cervical cancer   - Cytology - PAP

## 2015-07-26 NOTE — Patient Instructions (Signed)
Preventive Care for Adults, Female A healthy lifestyle and preventive care can promote health and wellness. Preventive health guidelines for women include the following key practices.  A routine yearly physical is a good way to check with your health care provider about your health and preventive screening. It is a chance to share any concerns and updates on your health and to receive a thorough exam.  Visit your dentist for a routine exam and preventive care every 6 months. Brush your teeth twice a day and floss once a day. Good oral hygiene prevents tooth decay and gum disease.  The frequency of eye exams is based on your age, health, family medical history, use of contact lenses, and other factors. Follow your health care provider's recommendations for frequency of eye exams.  Eat a healthy diet. Foods like vegetables, fruits, whole grains, low-fat dairy products, and lean protein foods contain the nutrients you need without too many calories. Decrease your intake of foods high in solid fats, added sugars, and salt. Eat the right amount of calories for you.Get information about a proper diet from your health care provider, if necessary.  Regular physical exercise is one of the most important things you can do for your health. Most adults should get at least 150 minutes of moderate-intensity exercise (any activity that increases your heart rate and causes you to sweat) each week. In addition, most adults need muscle-strengthening exercises on 2 or more days a week.  Maintain a healthy weight. The body mass index (BMI) is a screening tool to identify possible weight problems. It provides an estimate of body fat based on height and weight. Your health care provider can find your BMI and can help you achieve or maintain a healthy weight.For adults 20 years and older:  A BMI below 18.5 is considered underweight.  A BMI of 18.5 to 24.9 is normal.  A BMI of 25 to 29.9 is considered overweight.  A  BMI of 30 and above is considered obese.  Maintain normal blood lipids and cholesterol levels by exercising and minimizing your intake of saturated fat. Eat a balanced diet with plenty of fruit and vegetables. Blood tests for lipids and cholesterol should begin at age 45 and be repeated every 5 years. If your lipid or cholesterol levels are high, you are over 50, or you are at high risk for heart disease, you may need your cholesterol levels checked more frequently.Ongoing high lipid and cholesterol levels should be treated with medicines if diet and exercise are not working.  If you smoke, find out from your health care provider how to quit. If you do not use tobacco, do not start.  Lung cancer screening is recommended for adults aged 45-80 years who are at high risk for developing lung cancer because of a history of smoking. A yearly low-dose CT scan of the lungs is recommended for people who have at least a 30-pack-year history of smoking and are a current smoker or have quit within the past 15 years. A pack year of smoking is smoking an average of 1 pack of cigarettes a day for 1 year (for example: 1 pack a day for 30 years or 2 packs a day for 15 years). Yearly screening should continue until the smoker has stopped smoking for at least 15 years. Yearly screening should be stopped for people who develop a health problem that would prevent them from having lung cancer treatment.  If you are pregnant, do not drink alcohol. If you are  breastfeeding, be very cautious about drinking alcohol. If you are not pregnant and choose to drink alcohol, do not have more than 1 drink per day. One drink is considered to be 12 ounces (355 mL) of beer, 5 ounces (148 mL) of wine, or 1.5 ounces (44 mL) of liquor.  Avoid use of street drugs. Do not share needles with anyone. Ask for help if you need support or instructions about stopping the use of drugs.  High blood pressure causes heart disease and increases the risk  of stroke. Your blood pressure should be checked at least every 1 to 2 years. Ongoing high blood pressure should be treated with medicines if weight loss and exercise do not work.  If you are 55-79 years old, ask your health care provider if you should take aspirin to prevent strokes.  Diabetes screening is done by taking a blood sample to check your blood glucose level after you have not eaten for a certain period of time (fasting). If you are not overweight and you do not have risk factors for diabetes, you should be screened once every 3 years starting at age 45. If you are overweight or obese and you are 40-70 years of age, you should be screened for diabetes every year as part of your cardiovascular risk assessment.  Breast cancer screening is essential preventive care for women. You should practice "breast self-awareness." This means understanding the normal appearance and feel of your breasts and may include breast self-examination. Any changes detected, no matter how small, should be reported to a health care provider. Women in their 20s and 30s should have a clinical breast exam (CBE) by a health care provider as part of a regular health exam every 1 to 3 years. After age 40, women should have a CBE every year. Starting at age 40, women should consider having a mammogram (breast X-ray test) every year. Women who have a family history of breast cancer should talk to their health care provider about genetic screening. Women at a high risk of breast cancer should talk to their health care providers about having an MRI and a mammogram every year.  Breast cancer gene (BRCA)-related cancer risk assessment is recommended for women who have family members with BRCA-related cancers. BRCA-related cancers include breast, ovarian, tubal, and peritoneal cancers. Having family members with these cancers may be associated with an increased risk for harmful changes (mutations) in the breast cancer genes BRCA1 and  BRCA2. Results of the assessment will determine the need for genetic counseling and BRCA1 and BRCA2 testing.  Your health care provider may recommend that you be screened regularly for cancer of the pelvic organs (ovaries, uterus, and vagina). This screening involves a pelvic examination, including checking for microscopic changes to the surface of your cervix (Pap test). You may be encouraged to have this screening done every 3 years, beginning at age 21.  For women ages 30-65, health care providers may recommend pelvic exams and Pap testing every 3 years, or they may recommend the Pap and pelvic exam, combined with testing for human papilloma virus (HPV), every 5 years. Some types of HPV increase your risk of cervical cancer. Testing for HPV may also be done on women of any age with unclear Pap test results.  Other health care providers may not recommend any screening for nonpregnant women who are considered low risk for pelvic cancer and who do not have symptoms. Ask your health care provider if a screening pelvic exam is right for   you.  If you have had past treatment for cervical cancer or a condition that could lead to cancer, you need Pap tests and screening for cancer for at least 20 years after your treatment. If Pap tests have been discontinued, your risk factors (such as having a new sexual partner) need to be reassessed to determine if screening should resume. Some women have medical problems that increase the chance of getting cervical cancer. In these cases, your health care provider may recommend more frequent screening and Pap tests.  Colorectal cancer can be detected and often prevented. Most routine colorectal cancer screening begins at the age of 50 years and continues through age 75 years. However, your health care provider may recommend screening at an earlier age if you have risk factors for colon cancer. On a yearly basis, your health care provider may provide home test kits to check  for hidden blood in the stool. Use of a small camera at the end of a tube, to directly examine the colon (sigmoidoscopy or colonoscopy), can detect the earliest forms of colorectal cancer. Talk to your health care provider about this at age 50, when routine screening begins. Direct exam of the colon should be repeated every 5-10 years through age 75 years, unless early forms of precancerous polyps or small growths are found.  People who are at an increased risk for hepatitis B should be screened for this virus. You are considered at high risk for hepatitis B if:  You were born in a country where hepatitis B occurs often. Talk with your health care provider about which countries are considered high risk.  Your parents were born in a high-risk country and you have not received a shot to protect against hepatitis B (hepatitis B vaccine).  You have HIV or AIDS.  You use needles to inject street drugs.  You live with, or have sex with, someone who has hepatitis B.  You get hemodialysis treatment.  You take certain medicines for conditions like cancer, organ transplantation, and autoimmune conditions.  Hepatitis C blood testing is recommended for all people born from 1945 through 1965 and any individual with known risks for hepatitis C.  Practice safe sex. Use condoms and avoid high-risk sexual practices to reduce the spread of sexually transmitted infections (STIs). STIs include gonorrhea, chlamydia, syphilis, trichomonas, herpes, HPV, and human immunodeficiency virus (HIV). Herpes, HIV, and HPV are viral illnesses that have no cure. They can result in disability, cancer, and death.  You should be screened for sexually transmitted illnesses (STIs) including gonorrhea and chlamydia if:  You are sexually active and are younger than 24 years.  You are older than 24 years and your health care provider tells you that you are at risk for this type of infection.  Your sexual activity has changed  since you were last screened and you are at an increased risk for chlamydia or gonorrhea. Ask your health care provider if you are at risk.  If you are at risk of being infected with HIV, it is recommended that you take a prescription medicine daily to prevent HIV infection. This is called preexposure prophylaxis (PrEP). You are considered at risk if:  You are sexually active and do not regularly use condoms or know the HIV status of your partner(s).  You take drugs by injection.  You are sexually active with a partner who has HIV.  Talk with your health care provider about whether you are at high risk of being infected with HIV. If   you choose to begin PrEP, you should first be tested for HIV. You should then be tested every 3 months for as long as you are taking PrEP.  Osteoporosis is a disease in which the bones lose minerals and strength with aging. This can result in serious bone fractures or breaks. The risk of osteoporosis can be identified using a bone density scan. Women ages 67 years and over and women at risk for fractures or osteoporosis should discuss screening with their health care providers. Ask your health care provider whether you should take a calcium supplement or vitamin D to reduce the rate of osteoporosis.  Menopause can be associated with physical symptoms and risks. Hormone replacement therapy is available to decrease symptoms and risks. You should talk to your health care provider about whether hormone replacement therapy is right for you.  Use sunscreen. Apply sunscreen liberally and repeatedly throughout the day. You should seek shade when your shadow is shorter than you. Protect yourself by wearing long sleeves, pants, a wide-brimmed hat, and sunglasses year round, whenever you are outdoors.  Once a month, do a whole body skin exam, using a mirror to look at the skin on your back. Tell your health care provider of new moles, moles that have irregular borders, moles that  are larger than a pencil eraser, or moles that have changed in shape or color.  Stay current with required vaccines (immunizations).  Influenza vaccine. All adults should be immunized every year.  Tetanus, diphtheria, and acellular pertussis (Td, Tdap) vaccine. Pregnant women should receive 1 dose of Tdap vaccine during each pregnancy. The dose should be obtained regardless of the length of time since the last dose. Immunization is preferred during the 27th-36th week of gestation. An adult who has not previously received Tdap or who does not know her vaccine status should receive 1 dose of Tdap. This initial dose should be followed by tetanus and diphtheria toxoids (Td) booster doses every 10 years. Adults with an unknown or incomplete history of completing a 3-dose immunization series with Td-containing vaccines should begin or complete a primary immunization series including a Tdap dose. Adults should receive a Td booster every 10 years.  Varicella vaccine. An adult without evidence of immunity to varicella should receive 2 doses or a second dose if she has previously received 1 dose. Pregnant females who do not have evidence of immunity should receive the first dose after pregnancy. This first dose should be obtained before leaving the health care facility. The second dose should be obtained 4-8 weeks after the first dose.  Human papillomavirus (HPV) vaccine. Females aged 13-26 years who have not received the vaccine previously should obtain the 3-dose series. The vaccine is not recommended for use in pregnant females. However, pregnancy testing is not needed before receiving a dose. If a female is found to be pregnant after receiving a dose, no treatment is needed. In that case, the remaining doses should be delayed until after the pregnancy. Immunization is recommended for any person with an immunocompromised condition through the age of 61 years if she did not get any or all doses earlier. During the  3-dose series, the second dose should be obtained 4-8 weeks after the first dose. The third dose should be obtained 24 weeks after the first dose and 16 weeks after the second dose.  Zoster vaccine. One dose is recommended for adults aged 30 years or older unless certain conditions are present.  Measles, mumps, and rubella (MMR) vaccine. Adults born  before 1957 generally are considered immune to measles and mumps. Adults born in 1957 or later should have 1 or more doses of MMR vaccine unless there is a contraindication to the vaccine or there is laboratory evidence of immunity to each of the three diseases. A routine second dose of MMR vaccine should be obtained at least 28 days after the first dose for students attending postsecondary schools, health care workers, or international travelers. People who received inactivated measles vaccine or an unknown type of measles vaccine during 1963-1967 should receive 2 doses of MMR vaccine. People who received inactivated mumps vaccine or an unknown type of mumps vaccine before 1979 and are at high risk for mumps infection should consider immunization with 2 doses of MMR vaccine. For females of childbearing age, rubella immunity should be determined. If there is no evidence of immunity, females who are not pregnant should be vaccinated. If there is no evidence of immunity, females who are pregnant should delay immunization until after pregnancy. Unvaccinated health care workers born before 1957 who lack laboratory evidence of measles, mumps, or rubella immunity or laboratory confirmation of disease should consider measles and mumps immunization with 2 doses of MMR vaccine or rubella immunization with 1 dose of MMR vaccine.  Pneumococcal 13-valent conjugate (PCV13) vaccine. When indicated, a person who is uncertain of his immunization history and has no record of immunization should receive the PCV13 vaccine. All adults 65 years of age and older should receive this  vaccine. An adult aged 19 years or older who has certain medical conditions and has not been previously immunized should receive 1 dose of PCV13 vaccine. This PCV13 should be followed with a dose of pneumococcal polysaccharide (PPSV23) vaccine. Adults who are at high risk for pneumococcal disease should obtain the PPSV23 vaccine at least 8 weeks after the dose of PCV13 vaccine. Adults older than 55 years of age who have normal immune system function should obtain the PPSV23 vaccine dose at least 1 year after the dose of PCV13 vaccine.  Pneumococcal polysaccharide (PPSV23) vaccine. When PCV13 is also indicated, PCV13 should be obtained first. All adults aged 65 years and older should be immunized. An adult younger than age 65 years who has certain medical conditions should be immunized. Any person who resides in a nursing home or long-term care facility should be immunized. An adult smoker should be immunized. People with an immunocompromised condition and certain other conditions should receive both PCV13 and PPSV23 vaccines. People with human immunodeficiency virus (HIV) infection should be immunized as soon as possible after diagnosis. Immunization during chemotherapy or radiation therapy should be avoided. Routine use of PPSV23 vaccine is not recommended for American Indians, [place] Natives, or people younger than 65 years unless there are medical conditions that require PPSV23 vaccine. When indicated, people who have unknown immunization and have no record of immunization should receive PPSV23 vaccine. One-time revaccination 5 years after the first dose of PPSV23 is recommended for people aged 19-64 years who have chronic kidney failure, nephrotic syndrome, asplenia, or immunocompromised conditions. People who received 1-2 doses of PPSV23 before age 65 years should receive another dose of PPSV23 vaccine at age 65 years or later if at least 5 years have passed since the previous dose. Doses of PPSV23 are not  needed for people immunized with PPSV23 at or after age 65 years.  Meningococcal vaccine. Adults with asplenia or persistent complement component deficiencies should receive 2 doses of quadrivalent meningococcal conjugate (MenACWY-D) vaccine. The doses should be obtained   at least 2 months apart. Microbiologists working with certain meningococcal bacteria, [place] recruits, people at risk during an outbreak, and people who travel to or live in countries with a high rate of meningitis should be immunized. A first-year college student up through age 34 years who is living in a residence hall should receive a dose if she did not receive a dose on or after her 16th birthday. Adults who have certain high-risk conditions should receive one or more doses of vaccine.  Hepatitis A vaccine. Adults who wish to be protected from this disease, have certain high-risk conditions, work with hepatitis A-infected animals, work in hepatitis A research labs, or travel to or work in countries with a high rate of hepatitis A should be immunized. Adults who were previously unvaccinated and who anticipate close contact with an international adoptee during the first 60 days after arrival in the [place] from a country with a high rate of hepatitis A should be immunized.  Hepatitis B vaccine. Adults who wish to be protected from this disease, have certain high-risk conditions, may be exposed to blood or other infectious body fluids, are household contacts or sex partners of hepatitis B positive people, are clients or workers in certain care facilities, or travel to or work in countries with a high rate of hepatitis B should be immunized.  Haemophilus influenzae type b (Hib) vaccine. A previously unvaccinated person with asplenia or sickle cell disease or having a scheduled splenectomy should receive 1 dose of Hib vaccine. Regardless of previous immunization, a recipient of a hematopoietic stem cell transplant should receive a  3-dose series 6-12 months after her successful transplant. Hib vaccine is not recommended for adults with HIV infection. Preventive Services / Frequency Ages 35 to 4 years  Blood pressure check.** / Every 3-5 years.  Lipid and cholesterol check.** / Every 5 years beginning at age 60.  Clinical breast exam.** / Every 3 years for women in their 71s and 10s.  BRCA-related cancer risk assessment.** / For women who have family members with a BRCA-related cancer (breast, ovarian, tubal, or peritoneal cancers).  Pap test.** / Every 2 years from ages 76 through 26. Every 3 years starting at age 61 through age 76 or 93 with a history of 3 consecutive normal Pap tests.  HPV screening.** / Every 3 years from ages 37 through ages 60 to 51 with a history of 3 consecutive normal Pap tests.  Hepatitis C blood test.** / For any individual with known risks for hepatitis C.  Skin self-exam. / Monthly.  Influenza vaccine. / Every year.  Tetanus, diphtheria, and acellular pertussis (Tdap, Td) vaccine.** / Consult your health care provider. Pregnant women should receive 1 dose of Tdap vaccine during each pregnancy. 1 dose of Td every 10 years.  Varicella vaccine.** / Consult your health care provider. Pregnant females who do not have evidence of immunity should receive the first dose after pregnancy.  HPV vaccine. / 3 doses over 6 months, if 93 and younger. The vaccine is not recommended for use in pregnant females. However, pregnancy testing is not needed before receiving a dose.  Measles, mumps, rubella (MMR) vaccine.** / You need at least 1 dose of MMR if you were born in 1957 or later. You may also need a 2nd dose. For females of childbearing age, rubella immunity should be determined. If there is no evidence of immunity, females who are not pregnant should be vaccinated. If there is no evidence of immunity, females who are  pregnant should delay immunization until after pregnancy.  Pneumococcal  13-valent conjugate (PCV13) vaccine.** / Consult your health care provider.  Pneumococcal polysaccharide (PPSV23) vaccine.** / 1 to 2 doses if you smoke cigarettes or if you have certain conditions.  Meningococcal vaccine.** / 1 dose if you are age 68 to 8 years and a Market researcher living in a residence hall, or have one of several medical conditions, you need to get vaccinated against meningococcal disease. You may also need additional booster doses.  Hepatitis A vaccine.** / Consult your health care provider.  Hepatitis B vaccine.** / Consult your health care provider.  Haemophilus influenzae type b (Hib) vaccine.** / Consult your health care provider. Ages 7 to 53 years  Blood pressure check.** / Every year.  Lipid and cholesterol check.** / Every 5 years beginning at age 25 years.  Lung cancer screening. / Every year if you are aged 11-80 years and have a 30-pack-year history of smoking and currently smoke or have quit within the past 15 years. Yearly screening is stopped once you have quit smoking for at least 15 years or develop a health problem that would prevent you from having lung cancer treatment.  Clinical breast exam.** / Every year after age 48 years.  BRCA-related cancer risk assessment.** / For women who have family members with a BRCA-related cancer (breast, ovarian, tubal, or peritoneal cancers).  Mammogram.** / Every year beginning at age 41 years and continuing for as long as you are in good health. Consult with your health care provider.  Pap test.** / Every 3 years starting at age 65 years through age 37 or 70 years with a history of 3 consecutive normal Pap tests.  HPV screening.** / Every 3 years from ages 72 years through ages 60 to 40 years with a history of 3 consecutive normal Pap tests.  Fecal occult blood test (FOBT) of stool. / Every year beginning at age 21 years and continuing until age 5 years. You may not need to do this test if you get  a colonoscopy every 10 years.  Flexible sigmoidoscopy or colonoscopy.** / Every 5 years for a flexible sigmoidoscopy or every 10 years for a colonoscopy beginning at age 35 years and continuing until age 48 years.  Hepatitis C blood test.** / For all people born from [birth year] through 1965 and any individual with known risks for hepatitis C.  Skin self-exam. / Monthly.  Influenza vaccine. / Every year.  Tetanus, diphtheria, and acellular pertussis (Tdap/Td) vaccine.** / Consult your health care provider. Pregnant women should receive 1 dose of Tdap vaccine during each pregnancy. 1 dose of Td every 10 years.  Varicella vaccine.** / Consult your health care provider. Pregnant females who do not have evidence of immunity should receive the first dose after pregnancy.  Zoster vaccine.** / 1 dose for adults aged 30 years or older.  Measles, mumps, rubella (MMR) vaccine.** / You need at least 1 dose of MMR if you were born in 1957 or later. You may also need a second dose. For females of childbearing age, rubella immunity should be determined. If there is no evidence of immunity, females who are not pregnant should be vaccinated. If there is no evidence of immunity, females who are pregnant should delay immunization until after pregnancy.  Pneumococcal 13-valent conjugate (PCV13) vaccine.** / Consult your health care provider.  Pneumococcal polysaccharide (PPSV23) vaccine.** / 1 to 2 doses if you smoke cigarettes or if you have certain conditions.  Meningococcal vaccine.** /  Consult your health care provider.  Hepatitis A vaccine.** / Consult your health care provider.  Hepatitis B vaccine.** / Consult your health care provider.  Haemophilus influenzae type b (Hib) vaccine.** / Consult your health care provider. Ages 64 years and over  Blood pressure check.** / Every year.  Lipid and cholesterol check.** / Every 5 years beginning at age 23 years.  Lung cancer screening. / Every year if you  are aged 16-80 years and have a 30-pack-year history of smoking and currently smoke or have quit within the past 15 years. Yearly screening is stopped once you have quit smoking for at least 15 years or develop a health problem that would prevent you from having lung cancer treatment.  Clinical breast exam.** / Every year after age 74 years.  BRCA-related cancer risk assessment.** / For women who have family members with a BRCA-related cancer (breast, ovarian, tubal, or peritoneal cancers).  Mammogram.** / Every year beginning at age 44 years and continuing for as long as you are in good health. Consult with your health care provider.  Pap test.** / Every 3 years starting at age 58 years through age 22 or 39 years with 3 consecutive normal Pap tests. Testing can be stopped between 65 and 70 years with 3 consecutive normal Pap tests and no abnormal Pap or HPV tests in the past 10 years.  HPV screening.** / Every 3 years from ages 64 years through ages 70 or 61 years with a history of 3 consecutive normal Pap tests. Testing can be stopped between 65 and 70 years with 3 consecutive normal Pap tests and no abnormal Pap or HPV tests in the past 10 years.  Fecal occult blood test (FOBT) of stool. / Every year beginning at age 40 years and continuing until age 27 years. You may not need to do this test if you get a colonoscopy every 10 years.  Flexible sigmoidoscopy or colonoscopy.** / Every 5 years for a flexible sigmoidoscopy or every 10 years for a colonoscopy beginning at age 7 years and continuing until age 32 years.  Hepatitis C blood test.** / For all people born from [birth year] through 1965 and any individual with known risks for hepatitis C.  Osteoporosis screening.** / A one-time screening for women ages 30 years and over and women at risk for fractures or osteoporosis.  Skin self-exam. / Monthly.  Influenza vaccine. / Every year.  Tetanus, diphtheria, and acellular pertussis (Tdap/Td)  vaccine.** / 1 dose of Td every 10 years.  Varicella vaccine.** / Consult your health care provider.  Zoster vaccine.** / 1 dose for adults aged 35 years or older.  Pneumococcal 13-valent conjugate (PCV13) vaccine.** / Consult your health care provider.  Pneumococcal polysaccharide (PPSV23) vaccine.** / 1 dose for all adults aged 46 years and older.  Meningococcal vaccine.** / Consult your health care provider.  Hepatitis A vaccine.** / Consult your health care provider.  Hepatitis B vaccine.** / Consult your health care provider.  Haemophilus influenzae type b (Hib) vaccine.** / Consult your health care provider. ** Family history and personal history of risk and conditions may change your health care provider's recommendations.   This information is not intended to replace advice given to you by your health care provider. Make sure you discuss any questions you have with your health care provider.   Document Released: 11/28/2001 Document Revised: 10/23/2014 Document Reviewed: 02/27/2011 Elsevier Interactive Patient Education [employer].

## 2015-07-26 NOTE — Progress Notes (Signed)
Pre visit review using our clinic review tool, if applicable. No additional management support is needed unless otherwise documented below in the visit note. 

## 2015-07-28 LAB — CYTOLOGY - PAP

## 2015-07-29 ENCOUNTER — Other Ambulatory Visit (INDEPENDENT_AMBULATORY_CARE_PROVIDER_SITE_OTHER): Payer: BC Managed Care – PPO

## 2015-07-29 DIAGNOSIS — E785 Hyperlipidemia, unspecified: Secondary | ICD-10-CM

## 2015-07-29 DIAGNOSIS — Z0000 Encounter for general adult medical examination without abnormal findings: Secondary | ICD-10-CM

## 2015-07-29 DIAGNOSIS — I10 Essential (primary) hypertension: Secondary | ICD-10-CM

## 2015-07-29 LAB — COMPREHENSIVE METABOLIC PANEL
ALBUMIN: 4 g/dL (ref 3.5–5.2)
ALT: 22 U/L (ref 0–35)
AST: 16 U/L (ref 0–37)
Alkaline Phosphatase: 59 U/L (ref 39–117)
BILIRUBIN TOTAL: 0.9 mg/dL (ref 0.2–1.2)
BUN: 11 mg/dL (ref 6–23)
CHLORIDE: 104 meq/L (ref 96–112)
CO2: 29 mEq/L (ref 19–32)
CREATININE: 0.64 mg/dL (ref 0.40–1.20)
Calcium: 9.4 mg/dL (ref 8.4–10.5)
GFR: 102.35 mL/min (ref >60.00)
Glucose, Bld: 116 mg/dL — ABNORMAL HIGH (ref 70–99)
Potassium: 3.8 mEq/L (ref 3.5–5.1)
SODIUM: 142 meq/L (ref 135–145)
Total Protein: 6.9 g/dL (ref 6.0–8.3)

## 2015-07-29 LAB — MICROALBUMIN / CREATININE URINE RATIO
CREATININE, U: 177.6 mg/dL
MICROALB/CREAT RATIO: 1.6 mg/g (ref 0.0–30.0)
Microalb, Ur: 2.8 mg/dL — ABNORMAL HIGH (ref 0.0–1.9)

## 2015-07-29 LAB — CBC WITH DIFFERENTIAL/PLATELET
BASOS ABS: 0 10*3/uL (ref 0.0–0.1)
BASOS PCT: 0.5 % (ref 0.0–3.0)
EOS ABS: 0.2 10*3/uL (ref 0.0–0.7)
Eosinophils Relative: 1.9 % (ref 0.0–5.0)
HEMATOCRIT: 37.5 % (ref 36.0–46.0)
Hemoglobin: 12.7 g/dL (ref 12.0–15.0)
LYMPHS ABS: 2.4 10*3/uL (ref 0.7–4.0)
LYMPHS PCT: 30.3 % (ref 12.0–46.0)
MCHC: 33.9 g/dL (ref 30.0–36.0)
MCV: 95.2 fl (ref 78.0–100.0)
Monocytes Absolute: 0.6 10*3/uL (ref 0.1–1.0)
Monocytes Relative: 7.2 % (ref 3.0–12.0)
NEUTROS ABS: 4.8 10*3/uL (ref 1.4–7.7)
NEUTROS PCT: 60.1 % (ref 43.0–77.0)
PLATELETS: 200 10*3/uL (ref 150.0–400.0)
RBC: 3.94 Mil/uL (ref 3.87–5.11)
RDW: 12.9 % (ref 11.5–15.5)
WBC: 8 10*3/uL (ref 4.0–10.5)

## 2015-07-29 LAB — LIPID PANEL
CHOL/HDL RATIO: 3
Cholesterol: 154 mg/dL (ref 0–200)
HDL: 44.7 mg/dL (ref >39.00)
LDL Cholesterol: 91 mg/dL (ref 0–99)
NONHDL: 109.46
TRIGLYCERIDES: 94 mg/dL (ref 0.0–149.0)
VLDL: 18.8 mg/dL (ref 0.0–40.0)

## 2015-07-29 LAB — POCT URINALYSIS DIPSTICK
BILIRUBIN UA: NEGATIVE
Glucose, UA: NEGATIVE
KETONES UA: NEGATIVE
Leukocytes, UA: NEGATIVE
NITRITE UA: NEGATIVE
PH UA: 6
PROTEIN UA: NEGATIVE
RBC UA: NEGATIVE
Spec Grav, UA: 1.02
Urobilinogen, UA: 0.2

## 2015-07-29 LAB — TSH: TSH: 2.22 u[IU]/mL (ref 0.35–4.50)

## 2015-08-23 ENCOUNTER — Encounter: Payer: Self-pay | Admitting: Family Medicine

## 2015-08-23 DIAGNOSIS — F411 Generalized anxiety disorder: Secondary | ICD-10-CM

## 2015-08-23 MED ORDER — LORAZEPAM 0.5 MG PO TABS: 0.5000 mg | ORAL_TABLET | Freq: Three times a day (TID) | ORAL | Status: DC | PRN

## 2015-08-23 NOTE — Telephone Encounter (Signed)
Last seen 05/18/15 #90 with 2 rf. Please advise     KP

## 2015-10-08 ENCOUNTER — Ambulatory Visit (INDEPENDENT_AMBULATORY_CARE_PROVIDER_SITE_OTHER): Payer: BC Managed Care – PPO | Admitting: Medical

## 2015-10-08 ENCOUNTER — Encounter: Payer: Self-pay | Admitting: Medical

## 2015-10-08 ENCOUNTER — Ambulatory Visit (HOSPITAL_BASED_OUTPATIENT_CLINIC_OR_DEPARTMENT_OTHER)
Admission: RE | Admit: 2015-10-08 | Discharge: 2015-10-08 | Disposition: A | Discharge status: Home / Self Care | Payer: BC Managed Care – PPO | Source: Ambulatory Visit | Attending: Medical | Admitting: Medical

## 2015-10-08 VITALS — BP 158/75 | HR 72 | Temp 97.9°F | Ht 68.0 in | Wt 370.2 lb

## 2015-10-08 DIAGNOSIS — M25561 Pain in right knee: Secondary | ICD-10-CM

## 2015-10-08 DIAGNOSIS — M7989 Other specified soft tissue disorders: Secondary | ICD-10-CM | POA: Insufficient documentation

## 2015-10-08 DIAGNOSIS — M79661 Pain in right lower leg: Secondary | ICD-10-CM | POA: Diagnosis not present

## 2015-10-08 DIAGNOSIS — L03115 Cellulitis of right lower limb: Secondary | ICD-10-CM | POA: Diagnosis not present

## 2015-10-08 MED ORDER — SULFAMETHOXAZOLE-TRIMETHOPRIM 800-160 MG PO TABS: 1.0000 | ORAL_TABLET | Freq: Two times a day (BID) | ORAL | Status: DC

## 2015-10-08 NOTE — Progress Notes (Signed)
Pre visit review using our clinic review tool, if applicable. No additional management support is needed unless otherwise documented below in the visit note. 

## 2015-10-08 NOTE — Progress Notes (Signed)
Subjective:    Patient ID: Pam Phillips, female    DOB: Jul 15, 1961, 54 y.o.   MRN: [id number]  HPI  Pt in for rt knee injury. She fell and hit her knee about one week ago. She has bruising present below the knee. Lower tibia area mild red and hurts. Pt states had been  walking on leg early in week. Today started to hurt. No sob. No hip pain. Pain occurred after she fell.    Review of Systems  Constitutional: Negative for fever, chills and fatigue.  Respiratory: Negative for cough, choking, chest tightness, shortness of breath and wheezing.   Cardiovascular: Negative for chest pain and palpitations.  Gastrointestinal: Negative for abdominal pain.  Musculoskeletal:       See hpi.  Neurological: Negative for dizziness and light-headedness.    Past Medical History  Diagnosis Date  . Anxiety   . Hypertension   . Hyperlipidemia   . Heart murmur   . Ankle swelling 07-04-12    mostly left ankle -wears compression hose occ.  . Cancer ([place]) 07-04-12    dx. Melanoma in situ -back(mid-scapula area)    Social History   Social History  . Marital Status: Married    Spouse Name: N/A  . Number of Children: N/A  . 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 Smoking status: Never Smoker   . Smokeless tobacco: Never Used  . Alcohol Use: 0.6 oz/week    1 Glasses of wine per week     Comment: r-occ. social 1 wine per week  . Drug Use: No  . Sexual Activity:    Partners: Male   Other Topics Concern  . Not on file   Social History Narrative   Exercise-- zumba 4-5 x a week in summer,  2-3 x during school     Past Surgical History  Procedure Laterality Date  . Dental surgery  07-04-12    2 wisdom teeth and 2 other teeth  . Melanoma excision  07/10/2012    Procedure: MELANOMA EXCISION;  Surgeon: Stark Klein, MD;  Location: [facility];  Service: General;  Laterality: N/A;  excision of back melanoma insitu  .  Cholecystectomy  07-04-12    '03-Lap. due to gallstones-[place]  . Colonoscopy N/A 08/18/2013    Procedure: COLONOSCOPY;  Surgeon: Jerene Bears, MD;  Location: [facility];  Service: Gastroenterology;  Laterality: N/A;    Family History  Problem Relation Age of Onset  . Coronary artery disease  40    1st degree female  . Hypertension Father   . COPD Father   . Heart disease Father 67    MI  . Cancer Father 65    prostate ca  . Colon cancer Neg Hx   . Pancreatic cancer Neg Hx   . Stomach cancer Neg Hx     Allergies  Allergen Reactions  . Other Swelling    Peaches(fresh) causes throat itching-swelling  . Codeine     Nausea & vomiting  . Penicillins     angioedema    Current Outpatient Prescriptions on File Prior to Visit  Medication Sig Dispense Refill  . aspirin 81 MG EC tablet Take 81 mg by mouth every morning.     . fish oil-omega-3 fatty acids 1000 MG capsule Take 1 g by mouth 2 (two) times daily.     . fluticasone (FLONASE) 50 MCG/ACT nasal spray Place 2  sprays into both nostrils daily. 16 g 1  . LORazepam (ATIVAN) 0.5 MG tablet Take 1 tablet (0.5 mg total) by mouth every 8 (eight) hours as needed for anxiety. 90 tablet 2  . lovastatin (MEVACOR) 40 MG tablet TAKE 1 TABLET BY MOUTH EVERY NIGHT AT BEDTIME 90 tablet 1  . Multiple Vitamin (MULTIVITAMIN WITH MINERALS) TABS tablet Take 1 tablet by mouth every morning.    . nystatin (MYCOSTATIN/NYSTOP) 100000 UNIT/GM POWD Apply tid prn 60 g 3  . polyvinyl alcohol (LIQUIFILM TEARS) 1.4 % ophthalmic solution Place 1 drop into both eyes daily as needed (For dry eyes.).    Marland Kitchen tolterodine (DETROL) 2 MG tablet TAKE 1 TABLET BY MOUTH TWICE DAILY 60 tablet 11  . valsartan-hydrochlorothiazide (DIOVAN-HCT) 320-25 MG tablet TAKE 1 TABLET BY MOUTH EVERY DAY 30 tablet 5  . [DISCONTINUED] tolterodine (DETROL) 2 MG tablet Take 1 tablet (2 mg total) by mouth 2 (two) times daily. 60 tablet 2   No current facility-administered medications on  file prior to visit.    BP 158/75 mmHg  Pulse 72  Temp(Src) 97.9 F (36.6 C) (Oral)  Ht 5\' 8"  (1.727 m)  Wt 370 lb 3.2 oz (167.922 kg)  BMI 56.30 kg/m2  SpO2 98%  LMP 06/26/2012       Objective:   Physical Exam  General Mental Status- Alert. General Appearance- Not in acute distress.   Skin General: Color- Normal Color. Moisture- Normal Moisture.    Chest and Lung Exam Auscultation: Breath Sounds:-Normal.  Cardiovascular Auscultation:Rythm- Regular. Murmurs & Other Heart Sounds:Auscultation of the heart reveals- No Murmurs.   Neurologic Cranial Nerve exam:- CN III-XII intact(No nystagmus), symmetric smile. Strength:- 5/5 equal and symmetric strength both upper and lower extremities.  Rt leg- mild pain on palpation of rt knee. She has good rom. Bruising below the patella that is diffuse. Negative homans sign but mild pain on palpation up to mid aspect of calf. Distal 2/3 anterior tibial area moderate bright red warm and tender. No ankle pain.  Lt leg- symmetric in size compared rt side.      Assessment & Plan:  [phone].  Rt knee and tibia/fibula xray.  Rt lower ext [place] stat tonight at 6:30. You need to check in at the ED desk. You can go home after test and I will call you with the results.   You do appear to have early cellulitis to rt anterior calf. Rx bactrim ds. If redness expands over weekend or worse associated symptoms then ED evaluation.  Elevate leg.  Follow up in Tuesday or Wednesday this coming week.

## 2015-10-08 NOTE — Patient Instructions (Addendum)
Rt knee and tibia/fibula xray.  Rt lower ext [place] stat tonight at 6:30. You need to check in at the ED desk. You can go home after test and I will call you with the results.   You do appear to have early cellulitis to rt anterior calf. Rx bactrim ds. If redness expands over weekend or worse associated symptoms then ED evaluation.  Elevate leg.  Follow up in Tuesday or Wednesday this coming week.

## 2015-10-13 ENCOUNTER — Encounter: Payer: Self-pay | Admitting: Medical

## 2015-10-13 ENCOUNTER — Ambulatory Visit (INDEPENDENT_AMBULATORY_CARE_PROVIDER_SITE_OTHER): Payer: BC Managed Care – PPO | Admitting: Medical

## 2015-10-13 VITALS — BP 126/80 | HR 81 | Temp 98.3°F | Ht 68.0 in | Wt 369.4 lb

## 2015-10-13 DIAGNOSIS — L03115 Cellulitis of right lower limb: Secondary | ICD-10-CM | POA: Diagnosis not present

## 2015-10-13 MED ORDER — SULFAMETHOXAZOLE-TRIMETHOPRIM 800-160 MG PO TABS
1.0000 | ORAL_TABLET | Freq: Two times a day (BID) | ORAL | Status: DC
Start: 2015-10-13 — End: 2015-10-13

## 2015-10-13 MED ORDER — SULFAMETHOXAZOLE-TRIMETHOPRIM 800-160 MG PO TABS: 1.0000 | ORAL_TABLET | Freq: Two times a day (BID) | ORAL | Status: DC

## 2015-10-13 NOTE — Patient Instructions (Signed)
Your cellulitis appears improved but not completely. I think you will do well. If any residual pinkness, warmth or tenderness by wed of next week the will make another 4 days or bactrim ds available.   If between now and Friday you report any worsening then will rx doxycycline antibiotic to use in addition to current antibiotic.  Please use probiotics while on antibiotic.  Provided you are doing well and don't have any complications, I ask that you my chart [place] on Wednesday this coming week with update and description of your rt lower ext.

## 2015-10-13 NOTE — Progress Notes (Signed)
Pre visit review using our clinic review tool, if applicable. No additional management support is needed unless otherwise documented below in the visit note. 

## 2015-10-13 NOTE — Progress Notes (Signed)
Subjective:    Patient ID: Pam Phillips, female    DOB: 03-03-1961, 54 y.o.   MRN: [id number]  HPI   Pt in states she feels better overall. All areas of concern feel better. Xrays were all negative and her rt lower ext doppler looks better.  Pt states the area rt lower tibia had mild achiness but yesterday and today the area feels better. No fever, no chills or sweats.   With rest and elevation her leg felt better. She though maybe on Monday mild achy because she was very active and not resting.   Review of Systems See hpi.   Past Medical History  Diagnosis Date  . Anxiety   . Hypertension   . Hyperlipidemia   . Heart murmur   . Ankle swelling 07-04-12    mostly left ankle -wears compression hose occ.  . Cancer ([place]) 07-04-12    dx. Melanoma in situ -back(mid-scapula area)    Social History   Social History  . Marital Status: Married    Spouse Name: N/A  . Number of Children: N/A  . 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 Smoking status: Never Smoker   . Smokeless tobacco: Never Used  . Alcohol Use: 0.6 oz/week    1 Glasses of wine per week     Comment: r-occ. social 1 wine per week  . Drug Use: No  . Sexual Activity:    Partners: Male   Other Topics Concern  . Not on file   Social History Narrative   Exercise-- zumba 4-5 x a week in summer,  2-3 x during school     Past Surgical History  Procedure Laterality Date  . Dental surgery  07-04-12    2 wisdom teeth and 2 other teeth  . Melanoma excision  07/10/2012    Procedure: MELANOMA EXCISION;  Surgeon: Stark Klein, MD;  Location: [facility];  Service: General;  Laterality: N/A;  excision of back melanoma insitu  . Cholecystectomy  07-04-12    '03-Lap. due to gallstones-[place]  . Colonoscopy N/A 08/18/2013    Procedure: COLONOSCOPY;  Surgeon: Jerene Bears, MD;  Location: [facility];  Service: Gastroenterology;  Laterality:  N/A;    Family History  Problem Relation Age of Onset  . Coronary artery disease  77    1st degree female  . Hypertension Father   . COPD Father   . Heart disease Father 5    MI  . Cancer Father 28    prostate ca  . Colon cancer Neg Hx   . Pancreatic cancer Neg Hx   . Stomach cancer Neg Hx     Allergies  Allergen Reactions  . Other Swelling    Peaches(fresh) causes throat itching-swelling  . Codeine     Nausea & vomiting  . Penicillins     angioedema    Current Outpatient Prescriptions on File Prior to Visit  Medication Sig Dispense Refill  . aspirin 81 MG EC tablet Take 81 mg by mouth every morning.     . fish oil-omega-3 fatty acids 1000 MG capsule Take 1 g by mouth 2 (two) times daily.     . fluticasone (FLONASE) 50 MCG/ACT nasal spray Place 2 sprays into both nostrils daily. 16 g 1  . LORazepam (ATIVAN) 0.5 MG tablet Take 1 tablet (0.5 mg total) by mouth every 8 (eight) hours as needed for anxiety. [place]  tablet 2  . lovastatin (MEVACOR) 40 MG tablet TAKE 1 TABLET BY MOUTH EVERY NIGHT AT BEDTIME 90 tablet 1  . Multiple Vitamin (MULTIVITAMIN WITH MINERALS) TABS tablet Take 1 tablet by mouth every morning.    . nystatin (MYCOSTATIN/NYSTOP) 100000 UNIT/GM POWD Apply tid prn 60 g 3  . polyvinyl alcohol (LIQUIFILM TEARS) 1.4 % ophthalmic solution Place 1 drop into both eyes daily as needed (For dry eyes.).    Marland Kitchen tolterodine (DETROL) 2 MG tablet TAKE 1 TABLET BY MOUTH TWICE DAILY 60 tablet 11  . valsartan-hydrochlorothiazide (DIOVAN-HCT) 320-25 MG tablet TAKE 1 TABLET BY MOUTH EVERY DAY 30 tablet 5  . [DISCONTINUED] tolterodine (DETROL) 2 MG tablet Take 1 tablet (2 mg total) by mouth 2 (two) times daily. 60 tablet 2   No current facility-administered medications on file prior to visit.    BP 126/80 mmHg  Pulse 81  Temp(Src) 98.3 F (36.8 C) (Oral)  Ht 5\' 8"  (1.727 m)  Wt 369 lb 6.4 oz (167.559 kg)  BMI 56.18 kg/m2  SpO2 98%  LMP 06/26/2012       Objective:    Physical Exam  General Mental Status- Alert. General Appearance- Not in acute distress.   Skin General: Color- See rt lower ext    Chest and Lung Exam Auscultation: Breath Sounds:-Normal.  Cardiovascular Auscultation:Rythm- Regular. Murmurs & Other Heart Sounds:Auscultation of the heart reveals- No Murmurs.   Neurologic Cranial Nerve exam:- CN III-XII intact(No nystagmus), symmetric smile. Strength:- 5/5 equal and symmetric strength both upper and lower extremities.  Rt leg- faint  pain on palpation of rt knee. She has good rom. Bruising below the patella  Is much lightened up now. . Negative homans sign but no pain on palpation up to mid aspect of calf. Distal 2/3 anterior tibial area faint mild pink and faint warm. Less tender. No ankle pain.  Lt leg- symmetric in size compared rt side.      Assessment & Plan:  Your cellulitis appears improved but not completely. I think you will do well. If any residual pinkness, warmth or tenderness by wed of next week the will make another 4 days or bactrim ds available.   If between now and Friday you report any worsening then will rx doxycycline antibiotic to use in addition to current antibiotic.  Please use probiotics while on antibiotic.  Provided you are doing well and don't have any complications, I ask that you my chart [place] on Wednesday this coming week with update and description of your rt lower ext.

## 2015-10-20 ENCOUNTER — Encounter: Payer: Self-pay | Admitting: Medical

## 2015-10-20 NOTE — Telephone Encounter (Signed)
Pt may call tomorrow early. But if not go ahead and schedule her for 2;15 on Friday. She needs to be seen before weekend and I don't want all open slots to be taken before she call.s

## 2015-10-22 ENCOUNTER — Encounter: Payer: Self-pay | Admitting: Medical

## 2015-10-22 ENCOUNTER — Ambulatory Visit (INDEPENDENT_AMBULATORY_CARE_PROVIDER_SITE_OTHER): Payer: BC Managed Care – PPO | Admitting: Medical

## 2015-10-22 VITALS — BP 126/84 | HR 77 | Temp 98.2°F | Ht 68.0 in | Wt 371.4 lb

## 2015-10-22 DIAGNOSIS — L03115 Cellulitis of right lower limb: Secondary | ICD-10-CM

## 2015-10-22 DIAGNOSIS — L089 Local infection of the skin and subcutaneous tissue, unspecified: Secondary | ICD-10-CM | POA: Diagnosis not present

## 2015-10-22 MED ORDER — CLINDAMYCIN HCL 150 MG PO CAPS: 150.0000 mg | ORAL_CAPSULE | Freq: Three times a day (TID) | ORAL | Status: DC

## 2015-10-22 NOTE — Progress Notes (Signed)
Pre visit review using our clinic review tool, if applicable. No additional management support is needed unless otherwise documented below in the visit note. 

## 2015-10-22 NOTE — Patient Instructions (Addendum)
Will rx clindamycin. Referral to wound center placed(appointment asap). Continue probiotic.  Call us on Tuesday for update.

## 2015-10-22 NOTE — Progress Notes (Signed)
Subjective:    Patient ID: Pam Phillips, female    DOB: September 16, 1961, 55 y.o.   MRN: [id number]  HPI   Pt has had 2 wks of red, warm and slight tender rt distal pretibial region. Pt has been on batcrim ds for about 2 weeks. Pt has had some incirmental improvement but not better completely. No fever or chills. Early on due to hx of fall and some pain related to fall did [place]. No dvt was seen.   Review of Systems  Constitutional: Negative for fever, chills and fatigue.  Respiratory: Negative for cough, chest tightness, shortness of breath and wheezing.   Cardiovascular: Negative for chest pain and palpitations.  Gastrointestinal: Negative for abdominal pain.  Musculoskeletal:       Rt pretibial redness and pain.  Neurological: Negative for dizziness and headaches.  Hematological: Negative for adenopathy. Does not bruise/bleed easily.  Psychiatric/Behavioral: Negative for behavioral problems and confusion.    Past Medical History  Diagnosis Date  . Anxiety   . Hypertension   . Hyperlipidemia   . Heart murmur   . Ankle swelling 07-04-12    mostly left ankle -wears compression hose occ.  . Cancer ([place]) 07-04-12    dx. Melanoma in situ -back(mid-scapula area)    Social History   Social History  . Marital Status: Married    Spouse Name: N/A  . Number of Children: N/A  . 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 Smoking status: Never Smoker   . Smokeless tobacco: Never Used  . Alcohol Use: 0.6 oz/week    1 Glasses of wine per week     Comment: r-occ. social 1 wine per week  . Drug Use: No  . Sexual Activity:    Partners: Male   Other Topics Concern  . Not on file   Social History Narrative   Exercise-- zumba 4-5 x a week in summer,  2-3 x during school     Past Surgical History  Procedure Laterality Date  . Dental surgery  07-04-12    2 wisdom teeth and 2 other teeth  . Melanoma excision   07/10/2012    Procedure: MELANOMA EXCISION;  Surgeon: Stark Klein, MD;  Location: [facility];  Service: General;  Laterality: N/A;  excision of back melanoma insitu  . Cholecystectomy  07-04-12    '03-Lap. due to gallstones-[place]  . Colonoscopy N/A 08/18/2013    Procedure: COLONOSCOPY;  Surgeon: Jerene Bears, MD;  Location: [facility];  Service: Gastroenterology;  Laterality: N/A;    Family History  Problem Relation Age of Onset  . Coronary artery disease  17    1st degree female  . Hypertension Father   . COPD Father   . Heart disease Father 81    MI  . Cancer Father 74    prostate ca  . Colon cancer Neg Hx   . Pancreatic cancer Neg Hx   . Stomach cancer Neg Hx     Allergies  Allergen Reactions  . Other Swelling    Peaches(fresh) causes throat itching-swelling  . Codeine     Nausea & vomiting  . Penicillins     angioedema    Current Outpatient Prescriptions on File Prior to Visit  Medication Sig Dispense Refill  . aspirin 81 MG EC tablet Take 81 mg by mouth every morning.     . fish oil-omega-3 fatty acids 1000 MG capsule  Take 1 g by mouth 2 (two) times daily.     . fluticasone (FLONASE) 50 MCG/ACT nasal spray Place 2 sprays into both nostrils daily. 16 g 1  . LORazepam (ATIVAN) 0.5 MG tablet Take 1 tablet (0.5 mg total) by mouth every 8 (eight) hours as needed for anxiety. 90 tablet 2  . lovastatin (MEVACOR) 40 MG tablet TAKE 1 TABLET BY MOUTH EVERY NIGHT AT BEDTIME 90 tablet 1  . Multiple Vitamin (MULTIVITAMIN WITH MINERALS) TABS tablet Take 1 tablet by mouth every morning.    . nystatin (MYCOSTATIN/NYSTOP) 100000 UNIT/GM POWD Apply tid prn 60 g 3  . polyvinyl alcohol (LIQUIFILM TEARS) 1.4 % ophthalmic solution Place 1 drop into both eyes daily as needed (For dry eyes.).    Marland Kitchen sulfamethoxazole-trimethoprim (BACTRIM DS,SEPTRA DS) 800-160 MG tablet Take 1 tablet by mouth 2 (two) times daily. 8 tablet 0  . tolterodine (DETROL) 2 MG tablet TAKE 1 TABLET BY MOUTH TWICE  DAILY 60 tablet 11  . valsartan-hydrochlorothiazide (DIOVAN-HCT) 320-25 MG tablet TAKE 1 TABLET BY MOUTH EVERY DAY 30 tablet 5  . [DISCONTINUED] tolterodine (DETROL) 2 MG tablet Take 1 tablet (2 mg total) by mouth 2 (two) times daily. 60 tablet 2   No current facility-administered medications on file prior to visit.    BP 126/84 mmHg  Pulse 77  Temp(Src) 98.2 F (36.8 C) (Oral)  Ht 5\' 8"  (1.727 m)  Wt 371 lb 6.4 oz (168.466 kg)  BMI 56.48 kg/m2  SpO2 97%  LMP 06/26/2012       Objective:   Physical Exam  General- No acute distress. Pleasant patient. Neck- Full range of motion, no jvd Lungs- Clear, even and unlabored. Heart- regular rate and rhythm. Neurologic- CNII- XII grossly intact.  Rt lower ext- 12 cm x 10 cm red, indurated area. Tender in center. No calf swelling compared to lt side. No popliteal pain.      Assessment & Plan:  Will rx clindamycin. Referral to wound center placed(appointment asap). Continue probiotic.  Call us on Tuesday for update.  Note since 2 wks on bactrim but residual persisting symptoms decided to switch to clindamycin but also get specialist opinion.

## 2015-11-23 ENCOUNTER — Other Ambulatory Visit: Payer: Self-pay | Admitting: Family Medicine

## 2015-11-23 ENCOUNTER — Encounter: Payer: Self-pay | Admitting: Family Medicine

## 2015-11-23 DIAGNOSIS — F411 Generalized anxiety disorder: Secondary | ICD-10-CM

## 2015-11-23 MED ORDER — LORAZEPAM 0.5 MG PO TABS
0.5000 mg | ORAL_TABLET | Freq: Three times a day (TID) | ORAL | Status: DC | PRN
Start: 2015-11-23 — End: 2016-02-01

## 2015-11-23 NOTE — Telephone Encounter (Signed)
Last filled 08/23/15 #90 with 2 refills.    Please advise    KP

## 2016-01-15 ENCOUNTER — Other Ambulatory Visit: Payer: Self-pay | Admitting: Family Medicine

## 2016-01-25 ENCOUNTER — Ambulatory Visit: Payer: BC Managed Care – PPO | Admitting: Family Medicine

## 2016-02-01 ENCOUNTER — Encounter: Payer: Self-pay | Admitting: Family Medicine

## 2016-02-01 ENCOUNTER — Ambulatory Visit (INDEPENDENT_AMBULATORY_CARE_PROVIDER_SITE_OTHER): Payer: BC Managed Care – PPO | Admitting: Family Medicine

## 2016-02-01 VITALS — BP 132/70 | HR 70 | Temp 98.3°F | Ht 68.0 in | Wt 366.0 lb

## 2016-02-01 DIAGNOSIS — E785 Hyperlipidemia, unspecified: Secondary | ICD-10-CM

## 2016-02-01 DIAGNOSIS — R32 Unspecified urinary incontinence: Secondary | ICD-10-CM

## 2016-02-01 DIAGNOSIS — N393 Stress incontinence (female) (male): Secondary | ICD-10-CM

## 2016-02-01 DIAGNOSIS — F411 Generalized anxiety disorder: Secondary | ICD-10-CM

## 2016-02-01 DIAGNOSIS — Z1159 Encounter for screening for other viral diseases: Secondary | ICD-10-CM

## 2016-02-01 DIAGNOSIS — I10 Essential (primary) hypertension: Secondary | ICD-10-CM | POA: Diagnosis not present

## 2016-02-01 MED ORDER — TOLTERODINE TARTRATE 2 MG PO TABS: 2.0000 mg | ORAL_TABLET | Freq: Two times a day (BID) | ORAL | Status: DC

## 2016-02-01 MED ORDER — LORAZEPAM 0.5 MG PO TABS: 0.5000 mg | ORAL_TABLET | Freq: Three times a day (TID) | ORAL | Status: DC | PRN

## 2016-02-01 MED ORDER — LOVASTATIN 40 MG PO TABS: 40.0000 mg | ORAL_TABLET | Freq: Every day | ORAL | Status: DC

## 2016-02-01 MED ORDER — VALSARTAN-HYDROCHLOROTHIAZIDE 320-25 MG PO TABS: 1.0000 | ORAL_TABLET | Freq: Every day | ORAL | Status: DC

## 2016-02-01 NOTE — Patient Instructions (Signed)
Hypertension Hypertension, commonly called high blood pressure, is when the force of blood pumping through your arteries is too strong. Your arteries are the blood vessels that carry blood from your heart throughout your body. A blood pressure reading consists of a higher number over a lower number, such as 110/72. The higher number (systolic) is the pressure inside your arteries when your heart pumps. The lower number (diastolic) is the pressure inside your arteries when your heart relaxes. Ideally you want your blood pressure below 120/80. Hypertension forces your heart to work harder to pump blood. Your arteries may become narrow or stiff. Having untreated or uncontrolled hypertension can cause heart attack, stroke, kidney disease, and other problems. RISK FACTORS Some risk factors for high blood pressure are controllable. Others are not.  Risk factors you cannot control include:   Race. You may be at higher risk if you are African American.  Age. Risk increases with age.  Gender. Men are at higher risk than women before age 45 years. After age 65, women are at higher risk than men. Risk factors you can control include:  Not getting enough exercise or physical activity.  Being overweight.  Getting too much fat, sugar, calories, or salt in your diet.  Drinking too much alcohol. SIGNS AND SYMPTOMS Hypertension does not usually cause signs or symptoms. Extremely high blood pressure (hypertensive crisis) may cause headache, anxiety, shortness of breath, and nosebleed. DIAGNOSIS To check if you have hypertension, your health care provider will measure your blood pressure while you are seated, with your arm held at the level of your heart. It should be measured at least twice using the same arm. Certain conditions can cause a difference in blood pressure between your right and left arms. A blood pressure reading that is higher than normal on one occasion does not mean that you need treatment. If  it is not clear whether you have high blood pressure, you may be asked to return on a different day to have your blood pressure checked again. Or, you may be asked to monitor your blood pressure at home for 1 or more weeks. TREATMENT Treating high blood pressure includes making lifestyle changes and possibly taking medicine. Living a healthy lifestyle can help lower high blood pressure. You may need to change some of your habits. Lifestyle changes may include:  Following the DASH diet. This diet is high in fruits, vegetables, and whole grains. It is low in salt, red meat, and added sugars.  Keep your sodium intake below 2,300 mg per day.  Getting at least 30-45 minutes of aerobic exercise at least 4 times per week.  Losing weight if necessary.  Not smoking.  Limiting alcoholic beverages.  Learning ways to reduce stress. Your health care provider may prescribe medicine if lifestyle changes are not enough to get your blood pressure under control, and if one of the following is true:  You are 18-59 years of age and your systolic blood pressure is above 140.  You are 60 years of age or older, and your systolic blood pressure is above 150.  Your diastolic blood pressure is above 90.  You have diabetes, and your systolic blood pressure is over 140 or your diastolic blood pressure is over 90.  You have kidney disease and your blood pressure is above 140/90.  You have heart disease and your blood pressure is above 140/90. Your personal target blood pressure may vary depending on your medical conditions, your age, and other factors. HOME CARE INSTRUCTIONS    Have your blood pressure rechecked as directed by your health care provider.   Take medicines only as directed by your health care provider. Follow the directions carefully. Blood pressure medicines must be taken as prescribed. The medicine does not work as well when you skip doses. Skipping doses also puts you at risk for  problems.  Do not smoke.   Monitor your blood pressure at home as directed by your health care provider. SEEK MEDICAL CARE IF:   You think you are having a reaction to medicines taken.  You have recurrent headaches or feel dizzy.  You have swelling in your ankles.  You have trouble with your vision. SEEK IMMEDIATE MEDICAL CARE IF:  You develop a severe headache or confusion.  You have unusual weakness, numbness, or feel faint.  You have severe chest or abdominal pain.  You vomit repeatedly.  You have trouble breathing. MAKE SURE YOU:   Understand these instructions.  Will watch your condition.  Will get help right away if you are not doing well or get worse.   This information is not intended to replace advice given to you by your health care provider. Make sure you discuss any questions you have with your health care provider.   Document Released: 10/02/2005 Document Revised: 02/16/2015 Document Reviewed: 07/25/2013 Elsevier Interactive Patient Education 2016 [employer]  

## 2016-02-01 NOTE — Progress Notes (Signed)
Pre visit review using our clinic review tool, if applicable. No additional management support is needed unless otherwise documented below in the visit note. 

## 2016-02-01 NOTE — Progress Notes (Signed)
Patient ID: Pam Phillips, female    DOB: 02/21/1961  Age: 55 y.o. MRN: [id number]    Subjective:  Subjective HPI Pam Phillips presents for f/u bp and labs.  No complaints.  Review of Systems  Constitutional: Negative for diaphoresis, appetite change, fatigue and unexpected weight change.  Eyes: Negative for pain, redness and visual disturbance.  Respiratory: Negative for cough, chest tightness, shortness of breath and wheezing.   Cardiovascular: Negative for chest pain, palpitations and leg swelling.  Endocrine: Negative for cold intolerance, heat intolerance, polydipsia, polyphagia and polyuria.  Genitourinary: Negative for dysuria, frequency and difficulty urinating.  Neurological: Negative for dizziness, light-headedness, numbness and headaches.    History Past Medical History  Diagnosis Date  . Anxiety   . Hypertension   . Hyperlipidemia   . Heart murmur   . Ankle swelling 07-04-12    mostly left ankle -wears compression hose occ.  . Cancer (Rule) 07-04-12    dx. Melanoma in situ -back(mid-scapula area)    She has past surgical history that includes Dental surgery (07-04-12); Melanoma excision (07/10/2012); Cholecystectomy (07-04-12); and Colonoscopy (N/A, 08/18/2013).   Her family history includes COPD in her father; Cancer (age of onset: 42) in her father; Heart disease (age of onset: 69) in her father; Hypertension in her father. There is no history of Colon cancer, Pancreatic cancer, or Stomach cancer.She reports that she has never smoked. She has never used smokeless tobacco. She reports that she drinks about 0.6 oz of alcohol per week. She reports that she does not use illicit drugs.  Current Outpatient Prescriptions on File Prior to Visit  Medication Sig Dispense Refill  . aspirin 81 MG EC tablet Take 81 mg by mouth every morning.     . fish oil-omega-3 fatty acids 1000 MG capsule Take 1 g by mouth 2 (two) times daily.     . fluticasone (FLONASE) 50 MCG/ACT nasal spray  Place 2 sprays into both nostrils daily. 16 g 1  . Multiple Vitamin (MULTIVITAMIN WITH MINERALS) TABS tablet Take 1 tablet by mouth every morning.    . nystatin (MYCOSTATIN/NYSTOP) 100000 UNIT/GM POWD Apply tid prn 60 g 3  . polyvinyl alcohol (LIQUIFILM TEARS) 1.4 % ophthalmic solution Place 1 drop into both eyes daily as needed (For dry eyes.).    . [DISCONTINUED] tolterodine (DETROL) 2 MG tablet Take 1 tablet (2 mg total) by mouth 2 (two) times daily. 60 tablet 2   No current facility-administered medications on file prior to visit.     Objective:  Objective Physical Exam  Constitutional: She is oriented to person, place, and time. She appears well-developed and well-nourished.  HENT:  Head: Normocephalic and atraumatic.  Eyes: Conjunctivae and EOM are normal.  Neck: Normal range of motion. Neck supple. No JVD present. Carotid bruit is not present. No thyromegaly present.  Cardiovascular: Normal rate, regular rhythm and normal heart sounds.   No murmur heard. Pulmonary/Chest: Effort normal and breath sounds normal. No respiratory distress. She has no wheezes. She has no rales. She exhibits no tenderness.  Musculoskeletal: She exhibits no edema.  Neurological: She is alert and oriented to person, place, and time.  Psychiatric: She has a normal mood and affect. Her behavior is normal.  Nursing note and vitals reviewed.  BP 132/70 mmHg  Pulse 70  Temp(Src) 98.3 F (36.8 C) (Oral)  Ht 5\' 8"  (1.727 m)  Wt 366 lb (166.017 kg)  BMI 55.66 kg/m2  SpO2 98%  LMP 06/26/2012 Wt Readings from Last 3  Encounters:  02/01/16 366 lb (166.017 kg)  10/22/15 371 lb 6.4 oz (168.466 kg)  10/13/15 369 lb 6.4 oz (167.559 kg)     Lab Results  Component Value Date   WBC 8.0 07/29/2015   HGB 12.7 07/29/2015   HCT 37.5 07/29/2015   PLT 200.0 07/29/2015   GLUCOSE 116* 07/29/2015   CHOL 154 07/29/2015   TRIG 94.0 07/29/2015   HDL 44.70 07/29/2015   LDLCALC 91 07/29/2015   ALT 22 07/29/2015    AST 16 07/29/2015   NA 142 07/29/2015   K 3.8 07/29/2015   CL 104 07/29/2015   CREATININE 0.64 07/29/2015   BUN 11 07/29/2015   CO2 29 07/29/2015   TSH 2.22 07/29/2015   HGBA1C 5.5 08/14/2012   MICROALBUR 2.8* 07/29/2015    Dg Tibia/fibula Right  10/08/2015  CLINICAL DATA:  Status post fall 1 week ago with bruising of the calf. EXAM: RIGHT TIBIA AND FIBULA - 2 VIEW COMPARISON:  None. FINDINGS: There is no evidence of fracture or dislocation. There are soft tissue swelling around the ankle. Degenerative joint changes of the right knee are noted. IMPRESSION: No acute fracture or dislocation. Soft tissue swelling around the ankle. Electronically Signed   By: Abelardo Diesel M.D.   On: 10/08/2015 16:35   US Venous Img Lower Unilateral Right  10/08/2015  CLINICAL DATA:  55 year old female with right lateral calf pain status post fall from curb 1 week previously. EXAM: RIGHT LOWER EXTREMITY VENOUS DOPPLER ULTRASOUND TECHNIQUE: Gray-scale sonography with graded compression, as well as color Doppler and duplex ultrasound were performed to evaluate the lower extremity deep venous systems from the level of the common femoral vein and including the common femoral, femoral, profunda femoral, popliteal and calf veins including the posterior tibial, peroneal and gastrocnemius veins when visible. The superficial great saphenous vein was also interrogated. Spectral Doppler was utilized to evaluate flow at rest and with distal augmentation maneuvers in the common femoral, femoral and popliteal veins. COMPARISON:  None. FINDINGS: Contralateral Common Femoral Vein: Respiratory phasicity is normal and symmetric with the symptomatic side. No evidence of thrombus. Normal compressibility. Common Femoral Vein: No evidence of thrombus. Normal compressibility, respiratory phasicity and response to augmentation. Saphenofemoral Junction: No evidence of thrombus. Normal compressibility and flow on color Doppler imaging. Profunda  Femoral Vein: No evidence of thrombus. Normal compressibility and flow on color Doppler imaging. Femoral Vein: No evidence of thrombus. Normal compressibility, respiratory phasicity and response to augmentation. Popliteal Vein: No evidence of thrombus. Normal compressibility, respiratory phasicity and response to augmentation. Calf Veins: No evidence of thrombus. Normal compressibility and flow on color Doppler imaging. Superficial Great Saphenous Vein: No evidence of thrombus. Normal compressibility and flow on color Doppler imaging. Venous Reflux:  None. Other Findings:  None. IMPRESSION: No evidence of deep venous thrombosis. Electronically Signed   By: Jacqulynn Cadet M.D.   On: 10/08/2015 16:24   Dg Knee 4 Views W/patella Right  10/08/2015  CLINICAL DATA:  Status post fall from a car 1 week ago with contusion of the knee, aching and swelling and bruising persists. EXAM: RIGHT KNEE - COMPLETE 4+ VIEW COMPARISON:  None in PACs FINDINGS: The bones of the right knee are adequately mineralized. There is no acute fracture nor dislocation. There is beaking of the tibial spines. The joint spaces are reasonably well-maintained. There are small spurs arising from the articular margins of the medial tibial plateau and the medial femoral condyle. There are small osteophytes arising from the articular margins  of the patella. There is spurring in the region of the insertion of the quadriceps tendon. There is no chondrocalcinosis or joint effusion. IMPRESSION: There is no acute bony abnormality of the right knee. 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 No definite joint effusion is observed. There is prominence of the soft tissues about the knee which may be related to the body habitus. Electronically Signed   By: David  Martinique M.D.   On: 10/08/2015 16:35     Assessment & Plan:  Plan I have discontinued Ms. Lad's sulfamethoxazole-trimethoprim and clindamycin. I have also changed her  valsartan-hydrochlorothiazide, tolterodine, and lovastatin. Additionally, I am having her maintain her aspirin, fish oil-omega-3 fatty acids, polyvinyl alcohol, multivitamin with minerals, fluticasone, nystatin, (Flaxseed, Linseed, (FLAX SEED OIL PO)), and LORazepam.  Meds ordered this encounter  Medications  . Flaxseed, Linseed, (FLAX SEED OIL PO)    Sig: Take by mouth.  . valsartan-hydrochlorothiazide (DIOVAN-HCT) 320-25 MG tablet    Sig: Take 1 tablet by mouth daily.    Dispense:  30 tablet    Refill:  5  . tolterodine (DETROL) 2 MG tablet    Sig: Take 1 tablet (2 mg total) by mouth 2 (two) times daily.    Dispense:  60 tablet    Refill:  11  . lovastatin (MEVACOR) 40 MG tablet    Sig: Take 1 tablet (40 mg total) by mouth at bedtime.    Dispense:  90 tablet    Refill:  1  . LORazepam (ATIVAN) 0.5 MG tablet    Sig: Take 1 tablet (0.5 mg total) by mouth every 8 (eight) hours as needed for anxiety.    Dispense:  90 tablet    Refill:  2    Problem List Items Addressed This Visit    None    Visit Diagnoses    Essential hypertension    -  Primary    Relevant Medications    valsartan-hydrochlorothiazide (DIOVAN-HCT) 320-25 MG tablet    lovastatin (MEVACOR) 40 MG tablet    Other Relevant Orders    Comprehensive metabolic panel    Lipid panel    Hyperlipidemia        Relevant Medications    valsartan-hydrochlorothiazide (DIOVAN-HCT) 320-25 MG tablet    lovastatin (MEVACOR) 40 MG tablet    Other Relevant Orders    Comprehensive metabolic panel    Lipid panel    Generalized anxiety disorder        Relevant Medications    LORazepam (ATIVAN) 0.5 MG tablet    Incontinence in female        Relevant Medications    tolterodine (DETROL) 2 MG tablet    Need for hepatitis C screening test        Relevant Orders    Hepatitis C antibody       Follow-up: Return in about 6 months (around 08/02/2016), or if symptoms worsen or fail to improve.  [name], DO

## 2016-02-02 LAB — COMPREHENSIVE METABOLIC PANEL
ALBUMIN: 4.1 g/dL (ref 3.5–5.2)
ALK PHOS: 55 U/L (ref 39–117)
ALT: 21 U/L (ref 0–35)
AST: 17 U/L (ref 0–37)
BILIRUBIN TOTAL: 0.6 mg/dL (ref 0.2–1.2)
BUN: 14 mg/dL (ref 6–23)
CO2: 30 mEq/L (ref 19–32)
CREATININE: 0.63 mg/dL (ref 0.40–1.20)
Calcium: 9.7 mg/dL (ref 8.4–10.5)
Chloride: 104 mEq/L (ref 96–112)
GFR: 104.03 mL/min (ref >60.00)
GLUCOSE: 105 mg/dL — AB (ref 70–99)
POTASSIUM: 3.7 meq/L (ref 3.5–5.1)
SODIUM: 141 meq/L (ref 135–145)
TOTAL PROTEIN: 6.9 g/dL (ref 6.0–8.3)

## 2016-02-02 LAB — LIPID PANEL
CHOLESTEROL: 154 mg/dL (ref 0–200)
HDL: 47.6 mg/dL (ref >39.00)
LDL Cholesterol: 79 mg/dL (ref 0–99)
NONHDL: 106.48
Total CHOL/HDL Ratio: 3
Triglycerides: 136 mg/dL (ref 0.0–149.0)
VLDL: 27.2 mg/dL (ref 0.0–40.0)

## 2016-02-02 LAB — HEPATITIS C ANTIBODY: HCV AB: NEGATIVE

## 2016-02-22 ENCOUNTER — Other Ambulatory Visit: Payer: Self-pay | Admitting: Family Medicine

## 2016-02-22 ENCOUNTER — Encounter: Payer: Self-pay | Admitting: Family Medicine

## 2016-02-22 NOTE — Telephone Encounter (Signed)
I called the pharmacy and they did not have the script and it has not been filled by any other facility. Rx called in.    KP

## 2016-03-07 ENCOUNTER — Encounter: Payer: Self-pay | Admitting: Family Medicine

## 2016-03-23 ENCOUNTER — Other Ambulatory Visit: Payer: Self-pay | Admitting: Family Medicine

## 2016-03-23 DIAGNOSIS — Z1231 Encounter for screening mammogram for malignant neoplasm of breast: Secondary | ICD-10-CM

## 2016-04-08 ENCOUNTER — Other Ambulatory Visit: Payer: Self-pay | Admitting: Family Medicine

## 2016-04-09 ENCOUNTER — Telehealth: Payer: BC Managed Care – PPO | Admitting: Nurse Practitioner

## 2016-04-09 DIAGNOSIS — R21 Rash and other nonspecific skin eruption: Secondary | ICD-10-CM

## 2016-04-09 NOTE — Progress Notes (Signed)
Based on what you shared with me it looks like you have a serious condition that should be evaluated in a face to face office visit.  If you are having a true medical emergency please call 911.  If you need an urgent face to face visit, [facility] has four urgent care centers for your convenience.  . [facility]  [phone] Get Driving Directions Find a Provider at this Location  [address] [place], [place] [address] . 8 am to 8 pm Monday-Friday . 9 am to 7 pm Saturday-Sunday  . [facility]  [phone] Get Driving Directions Find a Provider at this Location  [address], [address] [place], [place] [address] . 8 am to 8 pm Monday-Friday . 9 am to 6 pm Saturday . 11 am to 6 pm Sunday   . [facility] [place]  [phone] Get Driving Directions  [address] [place], [place] [address] . 8 am to 8 pm Monday-Friday . 9 am to 4 pm Saturday-Sunday   . [facility] (a walk in primary care provider)  [phone]  Get Driving Directions Find a Provider at this Location  [address] [place], [place] [address] . 8 am to 8:30 pm Monday-Thursday . 8 am to 6 pm Friday . 8 am to 4 pm Saturday-Sunday   Your e-visit answers were reviewed by a board certified advanced clinical practitioner to complete your personal care plan.  Thank you for using e-Visits. 

## 2016-04-10 ENCOUNTER — Encounter: Payer: Self-pay | Admitting: Medical

## 2016-04-10 ENCOUNTER — Ambulatory Visit (INDEPENDENT_AMBULATORY_CARE_PROVIDER_SITE_OTHER): Payer: BC Managed Care – PPO | Admitting: Medical

## 2016-04-10 VITALS — BP 124/80 | HR 67 | Temp 98.6°F | Ht 68.0 in | Wt 365.4 lb

## 2016-04-10 DIAGNOSIS — B029 Zoster without complications: Secondary | ICD-10-CM | POA: Diagnosis not present

## 2016-04-10 MED ORDER — FAMCICLOVIR 500 MG PO TABS: 500.0000 mg | ORAL_TABLET | Freq: Three times a day (TID) | ORAL | Status: DC

## 2016-04-10 NOTE — Progress Notes (Signed)
   Subjective:    Patient ID: Pam Phillips, female    DOB: 20-Apr-1961, 55 y.o.   MRN: [id number]  HPI   Pt in for rash. Rash on her back. It was mild itchin at first then began to sting . Pt tried e-visit. They told her to come in. Pt states slight burn and sore now. Pt had shingles vaccine about 2 years ago.  Pt states she had small blister appearance at first. Now scabbed over.   Review of Systems  Constitutional: Negative for fever, chills and fatigue.  Respiratory: Negative for cough, chest tightness, shortness of breath and wheezing.   Cardiovascular: Negative for chest pain and palpitations.  Skin: Positive for rash.  Hematological: Negative for adenopathy.       Objective:   Physical Exam  General- No acute distress. Pleasant patient. Lungs- Clear, even and unlabored. Heart- regular rate and rhythm. Neurologic- CNII- XII grossly intact.  Skin- back of skin. Rt cva area. Scattered group of small scabs grouped together.(15 scabs all together)      Assessment & Plan:  Your rash and history do have suspicious shingles like features. It is possible you had less of a outbreak due to vaccination history. Will go ahead and rx famvir. If area worsens or changes let us know.  Follow up 7-10 days or as needed

## 2016-04-10 NOTE — Patient Instructions (Addendum)
Your rash and history do have suspicious shingles like features. It is possible you had less of a outbreak due to vaccination history. Will go ahead and rx famvir. If area worsens or changes let us know.  Follow up 7-10 days or as needed

## 2016-04-10 NOTE — Progress Notes (Signed)
Pre visit review using our clinic tool,if applicable. No additional management support is needed unless otherwise documented below in the visit note.  

## 2016-04-23 ENCOUNTER — Encounter: Payer: Self-pay | Admitting: Family Medicine

## 2016-04-24 ENCOUNTER — Other Ambulatory Visit: Payer: Self-pay | Admitting: Family Medicine

## 2016-04-24 DIAGNOSIS — F411 Generalized anxiety disorder: Secondary | ICD-10-CM

## 2016-04-24 MED ORDER — LORAZEPAM 0.5 MG PO TABS: 0.5000 mg | ORAL_TABLET | Freq: Three times a day (TID) | ORAL | Status: DC | PRN

## 2016-04-24 NOTE — Telephone Encounter (Signed)
Last seen 02/01/16 and filled 02/01/16 #90 with 2 rf  Please advise    KP

## 2016-04-24 NOTE — Telephone Encounter (Signed)
printed

## 2016-04-27 ENCOUNTER — Ambulatory Visit (HOSPITAL_BASED_OUTPATIENT_CLINIC_OR_DEPARTMENT_OTHER): Payer: BC Managed Care – PPO

## 2016-05-01 ENCOUNTER — Ambulatory Visit (HOSPITAL_BASED_OUTPATIENT_CLINIC_OR_DEPARTMENT_OTHER): Payer: BC Managed Care – PPO

## 2016-05-04 ENCOUNTER — Ambulatory Visit (HOSPITAL_BASED_OUTPATIENT_CLINIC_OR_DEPARTMENT_OTHER)
Admission: RE | Admit: 2016-05-04 | Discharge: 2016-05-04 | Disposition: A | Discharge status: Home / Self Care | Payer: BC Managed Care – PPO | Source: Ambulatory Visit | Attending: Family Medicine | Admitting: Family Medicine

## 2016-05-04 DIAGNOSIS — Z1231 Encounter for screening mammogram for malignant neoplasm of breast: Secondary | ICD-10-CM | POA: Diagnosis present

## 2016-07-15 ENCOUNTER — Other Ambulatory Visit: Payer: Self-pay | Admitting: Family Medicine

## 2016-07-17 ENCOUNTER — Other Ambulatory Visit: Payer: Self-pay | Admitting: Family Medicine

## 2016-07-25 ENCOUNTER — Encounter: Payer: Self-pay | Admitting: Family Medicine

## 2016-07-25 DIAGNOSIS — F411 Generalized anxiety disorder: Secondary | ICD-10-CM

## 2016-07-25 MED ORDER — LORAZEPAM 0.5 MG PO TABS: 0.5000 mg | ORAL_TABLET | Freq: Three times a day (TID) | ORAL | 0 refills | Status: DC | PRN

## 2016-07-25 NOTE — Telephone Encounter (Signed)
Last seen 02/01/16 and filled 04/24/16 #90 with 2 refills [facility] 05/29/14 low risk  [place] patient. Please advise    KP

## 2016-08-04 ENCOUNTER — Ambulatory Visit: Payer: BC Managed Care – PPO | Admitting: Family Medicine

## 2016-08-10 ENCOUNTER — Ambulatory Visit: Payer: BC Managed Care – PPO | Admitting: Family Medicine

## 2016-08-29 ENCOUNTER — Encounter: Payer: Self-pay | Admitting: Family Medicine

## 2016-08-29 ENCOUNTER — Other Ambulatory Visit: Payer: Self-pay

## 2016-08-29 DIAGNOSIS — F411 Generalized anxiety disorder: Secondary | ICD-10-CM

## 2016-08-29 MED ORDER — LORAZEPAM 0.5 MG PO TABS: 0.5000 mg | ORAL_TABLET | Freq: Three times a day (TID) | ORAL | 0 refills | Status: DC | PRN

## 2016-08-29 NOTE — Telephone Encounter (Signed)
rx printed signed and faxed PC

## 2016-08-29 NOTE — Telephone Encounter (Signed)
Last seen 04/10/16 Last filled #90-0 rf 07/25/16 Sig: tx 1 tab po q8hr prn for anxiety Please advise PC

## 2016-09-12 ENCOUNTER — Ambulatory Visit (INDEPENDENT_AMBULATORY_CARE_PROVIDER_SITE_OTHER): Payer: BC Managed Care – PPO | Admitting: Family Medicine

## 2016-09-12 ENCOUNTER — Encounter: Payer: Self-pay | Admitting: Family Medicine

## 2016-09-12 VITALS — BP 150/80 | HR 71 | Temp 98.2°F | Resp 16 | Ht 68.0 in | Wt 367.6 lb

## 2016-09-12 DIAGNOSIS — R32 Unspecified urinary incontinence: Secondary | ICD-10-CM | POA: Diagnosis not present

## 2016-09-12 DIAGNOSIS — E785 Hyperlipidemia, unspecified: Secondary | ICD-10-CM

## 2016-09-12 DIAGNOSIS — I10 Essential (primary) hypertension: Secondary | ICD-10-CM | POA: Diagnosis not present

## 2016-09-12 MED ORDER — TOLTERODINE TARTRATE 2 MG PO TABS: 2.0000 mg | ORAL_TABLET | Freq: Two times a day (BID) | ORAL | 11 refills | Status: DC

## 2016-09-12 MED ORDER — LOVASTATIN 40 MG PO TABS: 40.0000 mg | ORAL_TABLET | Freq: Every day | ORAL | 0 refills | Status: DC

## 2016-09-12 MED ORDER — VALSARTAN-HYDROCHLOROTHIAZIDE 320-25 MG PO TABS: 1.0000 | ORAL_TABLET | Freq: Every day | ORAL | 5 refills | Status: DC

## 2016-09-12 NOTE — Patient Instructions (Signed)

## 2016-09-12 NOTE — Assessment & Plan Note (Signed)
stable °

## 2016-09-12 NOTE — Progress Notes (Signed)
Patient ID: Pam Phillips, female    DOB: 06/23/1961  Age: 55 y.o. MRN: [id number]    Subjective:  Subjective  HPI Pam Phillips presents for f/u  , bp and cholesterol.  No complaints  Review of Systems  Constitutional: Negative for appetite change, diaphoresis, fatigue and unexpected weight change.  Eyes: Negative for pain, redness and visual disturbance.  Respiratory: Negative for cough, chest tightness, shortness of breath and wheezing.   Cardiovascular: Negative for chest pain, palpitations and leg swelling.  Endocrine: Negative for cold intolerance, heat intolerance, polydipsia, polyphagia and polyuria.  Genitourinary: Negative for difficulty urinating, dysuria and frequency.  Neurological: Negative for dizziness, light-headedness, numbness and headaches.    History Past Medical History:  Diagnosis Date  . Ankle swelling 07-04-12   mostly left ankle -wears compression hose occ.  Marland Kitchen Anxiety   . Cancer ([place]) 07-04-12   dx. Melanoma in situ -back(mid-scapula area)  . Heart murmur   . Hyperlipidemia   . Hypertension     She has a past surgical history that includes Dental surgery (07-04-12); Melanoma excision (07/10/2012); Cholecystectomy (07-04-12); and Colonoscopy (N/A, 08/18/2013).   Her family history includes COPD in her father; Cancer (age of onset: 31) in her father; Heart disease (age of onset: 64) in her father; Hypertension in her father.She reports that she has never smoked. She has never used smokeless tobacco. She reports that she drinks about 0.6 oz of alcohol per week . She reports that she does not use drugs.  Current Outpatient Prescriptions on File Prior to Visit  Medication Sig Dispense Refill  . aspirin 81 MG EC tablet Take 81 mg by mouth every morning.     . fish oil-omega-3 fatty acids 1000 MG capsule Take 1 g by mouth 2 (two) times daily.     . Flaxseed, Linseed, (FLAX SEED OIL PO) Take by mouth.    . fluticasone (FLONASE) 50 MCG/ACT nasal spray Place 2 sprays  into both nostrils daily. 16 g 1  . LORazepam (ATIVAN) 0.5 MG tablet Take 1 tablet (0.5 mg total) by mouth every 8 (eight) hours as needed for anxiety. 90 tablet 0  . Multiple Vitamin (MULTIVITAMIN WITH MINERALS) TABS tablet Take 1 tablet by mouth every morning.    . nystatin (MYCOSTATIN/NYSTOP) 100000 UNIT/GM POWD Apply tid prn 60 g 3  . polyvinyl alcohol (LIQUIFILM TEARS) 1.4 % ophthalmic solution Place 1 drop into both eyes daily as needed (For dry eyes.).    . [DISCONTINUED] tolterodine (DETROL) 2 MG tablet Take 1 tablet (2 mg total) by mouth 2 (two) times daily. 60 tablet 2   No current facility-administered medications on file prior to visit.      Objective:  Objective  Physical Exam  Constitutional: She is oriented to person, place, and time. She appears well-developed and well-nourished.  HENT:  Head: Normocephalic and atraumatic.  Eyes: Conjunctivae and EOM are normal.  Neck: Normal range of motion. Neck supple. No JVD present. Carotid bruit is not present. No thyromegaly present.  Cardiovascular: Normal rate, regular rhythm and normal heart sounds.   No murmur heard. Pulmonary/Chest: Effort normal and breath sounds normal. No respiratory distress. She has no wheezes. She has no rales. She exhibits no tenderness.  Musculoskeletal: She exhibits edema.       Right ankle: She exhibits swelling.       Left ankle: She exhibits swelling.  Neurological: She is alert and oriented to person, place, and time.  Psychiatric: She has a normal mood and  affect. Her behavior is normal. Judgment and thought content normal.  Nursing note and vitals reviewed.  BP (!) 150/80 (BP Location: Right Arm, Patient Position: Sitting, Cuff Size: Large)   Pulse 71   Temp 98.2 F (36.8 C) (Oral)   Resp 16   Ht 5\' 8"  (1.727 m)   Wt (!) 367 lb 9.6 oz (166.7 kg)   LMP 06/26/2012   SpO2 97%   BMI 55.89 kg/m  Wt Readings from Last 3 Encounters:  09/12/16 (!) 367 lb 9.6 oz (166.7 kg)  04/10/16 (!) 365  lb 6.4 oz (165.7 kg)  02/01/16 (!) 366 lb (166 kg)     Lab Results  Component Value Date   WBC 8.0 07/29/2015   HGB 12.7 07/29/2015   HCT 37.5 07/29/2015   PLT 200.0 07/29/2015   GLUCOSE 105 (H) 02/01/2016   CHOL 154 02/01/2016   TRIG 136.0 02/01/2016   HDL 47.60 02/01/2016   LDLCALC 79 02/01/2016   ALT 21 02/01/2016   AST 17 02/01/2016   NA 141 02/01/2016   K 3.7 02/01/2016   CL 104 02/01/2016   CREATININE 0.63 02/01/2016   BUN 14 02/01/2016   CO2 30 02/01/2016   TSH 2.22 07/29/2015   HGBA1C 5.5 08/14/2012   MICROALBUR 2.8 (H) 07/29/2015    Mm Digital Screening Bilateral  Result Date: 05/05/2016 CLINICAL DATA:  Screening. EXAM: DIGITAL SCREENING BILATERAL MAMMOGRAM WITH CAD COMPARISON:  Previous exam(s). ACR Breast Density Category b: There are scattered areas of fibroglandular density. FINDINGS: There are no findings suspicious for malignancy. Images were processed with CAD. IMPRESSION: No mammographic evidence of malignancy. A result letter of this screening mammogram will be mailed directly to the patient. RECOMMENDATION: Screening mammogram in one year. (Code:SM-B-01Y) BI-RADS CATEGORY  1: Negative. Electronically Signed   By: Claudie Revering M.D.   On: 05/05/2016 10:30     Assessment & Plan:  Plan  I have discontinued Pam Phillips's famciclovir. I have also changed her lovastatin, valsartan-hydrochlorothiazide, and tolterodine. Additionally, I am having her maintain her aspirin, fish oil-omega-3 fatty acids, polyvinyl alcohol, multivitamin with minerals, fluticasone, nystatin, (Flaxseed, Linseed, (FLAX SEED OIL PO)), and LORazepam.  Meds ordered this encounter  Medications  . lovastatin (MEVACOR) 40 MG tablet    Sig: Take 1 tablet (40 mg total) by mouth at bedtime.    Dispense:  90 tablet    Refill:  0  . valsartan-hydrochlorothiazide (DIOVAN-HCT) 320-25 MG tablet    Sig: Take 1 tablet by mouth daily.    Dispense:  30 tablet    Refill:  5  . tolterodine (DETROL) 2 MG  tablet    Sig: Take 1 tablet (2 mg total) by mouth 2 (two) times daily.    Dispense:  60 tablet    Refill:  11    Problem List Items Addressed This Visit      Unprioritized   Essential hypertension    stable      Relevant Medications   lovastatin (MEVACOR) 40 MG tablet   valsartan-hydrochlorothiazide (DIOVAN-HCT) 320-25 MG tablet   Other Relevant Orders   Lipid panel   Comprehensive metabolic panel   Hyperlipidemia LDL goal <100 - Primary    Check labs  con't lovastatin      Relevant Medications   lovastatin (MEVACOR) 40 MG tablet   valsartan-hydrochlorothiazide (DIOVAN-HCT) 320-25 MG tablet   Other Relevant Orders   Lipid panel   Comprehensive metabolic panel    Other Visit Diagnoses    Urinary incontinence, unspecified type  Relevant Medications   tolterodine (DETROL) 2 MG tablet      Follow-up: Return in about 6 months (around 03/12/2017) for hypertension, hyperlipidemia.  [name], DO

## 2016-09-12 NOTE — Progress Notes (Signed)
Pre visit review using our clinic review tool, if applicable. No additional management support is needed unless otherwise documented below in the visit note. 

## 2016-09-12 NOTE — Assessment & Plan Note (Signed)
Check labs  con't lovastatin

## 2016-09-14 ENCOUNTER — Other Ambulatory Visit (INDEPENDENT_AMBULATORY_CARE_PROVIDER_SITE_OTHER): Payer: BC Managed Care – PPO

## 2016-09-14 DIAGNOSIS — I10 Essential (primary) hypertension: Secondary | ICD-10-CM | POA: Diagnosis not present

## 2016-09-14 DIAGNOSIS — E785 Hyperlipidemia, unspecified: Secondary | ICD-10-CM | POA: Diagnosis not present

## 2016-09-14 LAB — COMPREHENSIVE METABOLIC PANEL
ALT: 17 U/L (ref 0–35)
AST: 14 U/L (ref 0–37)
Albumin: 4.2 g/dL (ref 3.5–5.2)
Alkaline Phosphatase: 56 U/L (ref 39–117)
BUN: 16 mg/dL (ref 6–23)
CALCIUM: 9.1 mg/dL (ref 8.4–10.5)
CHLORIDE: 103 meq/L (ref 96–112)
CO2: 30 meq/L (ref 19–32)
Creatinine, Ser: 0.67 mg/dL (ref 0.40–1.20)
GFR: 96.68 mL/min (ref >60.00)
Glucose, Bld: 108 mg/dL — ABNORMAL HIGH (ref 70–99)
Potassium: 3.7 mEq/L (ref 3.5–5.1)
Sodium: 141 mEq/L (ref 135–145)
Total Bilirubin: 0.9 mg/dL (ref 0.2–1.2)
Total Protein: 6.9 g/dL (ref 6.0–8.3)

## 2016-09-14 LAB — LIPID PANEL
CHOL/HDL RATIO: 3
Cholesterol: 149 mg/dL (ref 0–200)
HDL: 51.6 mg/dL (ref >39.00)
LDL CALC: 83 mg/dL (ref 0–99)
NonHDL: 97.73
TRIGLYCERIDES: 75 mg/dL (ref 0.0–149.0)
VLDL: 15 mg/dL (ref 0.0–40.0)

## 2016-09-28 ENCOUNTER — Encounter: Payer: Self-pay | Admitting: Family Medicine

## 2016-09-28 ENCOUNTER — Other Ambulatory Visit: Payer: Self-pay | Admitting: Family Medicine

## 2016-09-28 DIAGNOSIS — F411 Generalized anxiety disorder: Secondary | ICD-10-CM

## 2016-09-28 MED ORDER — LORAZEPAM 0.5 MG PO TABS: 0.5000 mg | ORAL_TABLET | Freq: Three times a day (TID) | ORAL | 0 refills | Status: DC | PRN

## 2016-09-28 NOTE — Telephone Encounter (Signed)
Pt requesting a refill on the following medication:  Lorazepam 0.5 mg tablet Last filled: 08/29/16 Amt: 90,0 Last OV:  09/12/16 Need UDS and updated contract.  Please advise.

## 2016-09-29 ENCOUNTER — Other Ambulatory Visit: Payer: Self-pay | Admitting: Family Medicine

## 2016-09-29 ENCOUNTER — Telehealth: Payer: Self-pay

## 2016-09-29 DIAGNOSIS — F411 Generalized anxiety disorder: Secondary | ICD-10-CM

## 2016-09-29 NOTE — Telephone Encounter (Signed)
Rx faxed to pharmacy. LB

## 2016-10-03 ENCOUNTER — Telehealth: Payer: Self-pay

## 2016-10-03 NOTE — Telephone Encounter (Signed)
Pharmacy confirmed pt picked up Rx Ativan. LB

## 2016-10-30 ENCOUNTER — Other Ambulatory Visit: Payer: Self-pay | Admitting: Family Medicine

## 2016-10-30 ENCOUNTER — Encounter: Payer: Self-pay | Admitting: Family Medicine

## 2016-10-30 DIAGNOSIS — F411 Generalized anxiety disorder: Secondary | ICD-10-CM

## 2016-10-30 MED ORDER — LORAZEPAM 0.5 MG PO TABS: 0.5000 mg | ORAL_TABLET | Freq: Three times a day (TID) | ORAL | 0 refills | Status: DC | PRN

## 2016-11-12 ENCOUNTER — Telehealth: Payer: BC Managed Care – PPO | Admitting: Physician Assistant

## 2016-11-12 DIAGNOSIS — H1030 Unspecified acute conjunctivitis, unspecified eye: Secondary | ICD-10-CM

## 2016-11-12 MED ORDER — POLYMYXIN B-TRIMETHOPRIM 10000-0.1 UNIT/ML-% OP SOLN: OPHTHALMIC | 0 refills | Status: DC

## 2016-11-12 NOTE — Progress Notes (Signed)

## 2016-11-30 ENCOUNTER — Encounter: Payer: Self-pay | Admitting: Family Medicine

## 2016-11-30 ENCOUNTER — Other Ambulatory Visit: Payer: Self-pay | Admitting: Family Medicine

## 2016-11-30 DIAGNOSIS — F411 Generalized anxiety disorder: Secondary | ICD-10-CM

## 2016-11-30 MED ORDER — LORAZEPAM 0.5 MG PO TABS: 0.5000 mg | ORAL_TABLET | Freq: Three times a day (TID) | ORAL | 0 refills | Status: DC | PRN

## 2016-11-30 NOTE — Telephone Encounter (Signed)
Refill x1   1 refill 

## 2016-12-31 ENCOUNTER — Encounter: Payer: Self-pay | Admitting: Family Medicine

## 2016-12-31 DIAGNOSIS — F411 Generalized anxiety disorder: Secondary | ICD-10-CM

## 2017-01-01 NOTE — Telephone Encounter (Signed)
Refill x1   1 refill 

## 2017-01-02 MED ORDER — LORAZEPAM 0.5 MG PO TABS: 0.5000 mg | ORAL_TABLET | Freq: Three times a day (TID) | ORAL | 0 refills | Status: DC | PRN

## 2017-01-06 ENCOUNTER — Other Ambulatory Visit: Payer: Self-pay | Admitting: Family Medicine

## 2017-01-06 DIAGNOSIS — B354 Tinea corporis: Secondary | ICD-10-CM

## 2017-01-13 ENCOUNTER — Other Ambulatory Visit: Payer: Self-pay | Admitting: Family Medicine

## 2017-01-27 ENCOUNTER — Other Ambulatory Visit: Payer: Self-pay | Admitting: Family Medicine

## 2017-01-27 DIAGNOSIS — E785 Hyperlipidemia, unspecified: Secondary | ICD-10-CM

## 2017-02-05 ENCOUNTER — Encounter: Payer: Self-pay | Admitting: Family Medicine

## 2017-02-05 DIAGNOSIS — F411 Generalized anxiety disorder: Secondary | ICD-10-CM

## 2017-02-05 MED ORDER — LORAZEPAM 0.5 MG PO TABS: 0.5000 mg | ORAL_TABLET | Freq: Three times a day (TID) | ORAL | 1 refills | Status: DC | PRN

## 2017-02-05 NOTE — Telephone Encounter (Signed)
Refill x1  With 1 refill--- printed

## 2017-03-15 ENCOUNTER — Ambulatory Visit: Payer: BC Managed Care – PPO | Admitting: Family Medicine

## 2017-03-16 ENCOUNTER — Ambulatory Visit (INDEPENDENT_AMBULATORY_CARE_PROVIDER_SITE_OTHER): Payer: BC Managed Care – PPO | Admitting: Family Medicine

## 2017-03-16 ENCOUNTER — Encounter: Payer: Self-pay | Admitting: Family Medicine

## 2017-03-16 VITALS — BP 142/82 | Temp 98.5°F | Ht 68.0 in | Wt 374.0 lb

## 2017-03-16 DIAGNOSIS — L03115 Cellulitis of right lower limb: Secondary | ICD-10-CM

## 2017-03-16 MED ORDER — CEPHALEXIN 500 MG PO CAPS: 500.0000 mg | ORAL_CAPSULE | Freq: Four times a day (QID) | ORAL | 0 refills | Status: DC

## 2017-03-16 NOTE — Patient Instructions (Signed)
WE NOW OFFER   [facility] FAST TRACK!!!  SAME DAY Appointments for ACUTE CARE  Such as: Sprains, Injuries, cuts, abrasions, rashes, muscle pain, joint pain, back pain Colds, flu, sore throats, headache, allergies, cough, fever  Ear pain, sinus and eye infections Abdominal pain, nausea, vomiting, diarrhea, upset stomach Animal/insect bites  3 Easy Ways to Schedule: Walk-In Scheduling Call in scheduling Mychart Sign-up: [email or web address]         

## 2017-03-16 NOTE — Progress Notes (Signed)
   Subjective:    Patient ID: Link Snuffer, female    DOB: 04/06/1961, 56 y.o.   MRN: [id number]  HPI Here for 2 days of redness on the right upper leg. No recent trauma. No fever. She had a cellulitis in the right leg last year and she is concerned it may be coming back.    Review of Systems  Constitutional: Negative.   Cardiovascular: Negative.   Skin: Positive for color change.       Objective:   Physical Exam  Constitutional: She appears well-developed and well-nourished.  Cardiovascular: Normal rate, regular rhythm, normal heart sounds and intact distal pulses.   Pulmonary/Chest: Effort normal and breath sounds normal.  Skin:  The right medial thigh just above the knee has an area of macular erythema. This is warm and slightly tender.           Assessment & Plan:  Early cellulitis. Treat with Keflex 500 mg QID. Recheck prn.  Alysia Penna, MD

## 2017-03-21 ENCOUNTER — Other Ambulatory Visit: Payer: Self-pay | Admitting: Family Medicine

## 2017-03-21 DIAGNOSIS — Z1231 Encounter for screening mammogram for malignant neoplasm of breast: Secondary | ICD-10-CM

## 2017-03-22 ENCOUNTER — Encounter: Payer: Self-pay | Admitting: Family Medicine

## 2017-03-23 ENCOUNTER — Other Ambulatory Visit: Payer: Self-pay | Admitting: Family Medicine

## 2017-03-23 MED ORDER — CEPHALEXIN 500 MG PO CAPS: 500.0000 mg | ORAL_CAPSULE | Freq: Four times a day (QID) | ORAL | 0 refills | Status: AC

## 2017-03-23 NOTE — Telephone Encounter (Signed)
Please call in an additional 10 days of keflex. Hopefully this will be all she needs

## 2017-03-27 ENCOUNTER — Ambulatory Visit: Payer: Self-pay | Admitting: Family Medicine

## 2017-03-28 ENCOUNTER — Other Ambulatory Visit: Payer: Self-pay | Admitting: Family Medicine

## 2017-03-28 ENCOUNTER — Encounter: Payer: Self-pay | Admitting: Family Medicine

## 2017-03-28 DIAGNOSIS — N760 Acute vaginitis: Secondary | ICD-10-CM

## 2017-03-28 MED ORDER — FLUCONAZOLE 150 MG PO TABS: ORAL_TABLET | ORAL | 2 refills | Status: DC

## 2017-03-30 ENCOUNTER — Ambulatory Visit (INDEPENDENT_AMBULATORY_CARE_PROVIDER_SITE_OTHER): Payer: BC Managed Care – PPO | Admitting: Family Medicine

## 2017-03-30 ENCOUNTER — Encounter: Payer: Self-pay | Admitting: Family Medicine

## 2017-03-30 VITALS — BP 126/82 | HR 70 | Temp 98.0°F | Resp 16 | Ht 68.0 in | Wt 378.6 lb

## 2017-03-30 DIAGNOSIS — Z23 Encounter for immunization: Secondary | ICD-10-CM

## 2017-03-30 DIAGNOSIS — I10 Essential (primary) hypertension: Secondary | ICD-10-CM | POA: Diagnosis not present

## 2017-03-30 DIAGNOSIS — E785 Hyperlipidemia, unspecified: Secondary | ICD-10-CM | POA: Diagnosis not present

## 2017-03-30 NOTE — Progress Notes (Signed)
Patient ID: Pam Phillips, female   DOB: 26-Aug-1961, 56 y.o.   MRN: [id number]     Subjective:  I acted as a Education administrator for Dr. Carollee Herter.  Guerry Bruin, [place]   Patient ID: Pam Phillips, female    DOB: November 06, 1960, 56 y.o.   MRN: [id number]  Chief Complaint  Patient presents with  . Hypertension  . Hyperlipidemia  . Cellulitis    right leg was seen by Dr. Sarajane Jews     HPI  Patient is in today for follow up for blood pressure and cholesterol.  She has been doing well on current treatment with no side effects.  Would like to for [place] to look at right leg.  She was seen last Friday by Dr. Sarajane Jews with diagnosis of cellulitis of right leg.  She states it is much better with the antibiotics.  She will be finished next Wed.    Patient Care Team: Carollee Herter, Alferd Apa, DO as PCP - General   Past Medical History:  Diagnosis Date  . Ankle swelling 07-04-12   mostly left ankle -wears compression hose occ.  Marland Kitchen Anxiety   . Cancer ([place]) 07-04-12   dx. Melanoma in situ -back(mid-scapula area)  . Heart murmur   . Hyperlipidemia   . Hypertension     Past Surgical History:  Procedure Laterality Date  . CHOLECYSTECTOMY  07-04-12   '03-Lap. due to gallstones-[place]  . COLONOSCOPY N/A 08/18/2013   Procedure: COLONOSCOPY;  Surgeon: Jerene Bears, MD;  Location: [facility];  Service: Gastroenterology;  Laterality: N/A;  . DENTAL SURGERY  07-04-12   2 wisdom teeth and 2 other teeth  . MELANOMA EXCISION  07/10/2012   Procedure: MELANOMA EXCISION;  Surgeon: Stark Klein, MD;  Location: [facility];  Service: General;  Laterality: N/A;  excision of back melanoma insitu    Family History  Problem Relation Age of Onset  . Coronary artery disease Unknown 12       1st degree female  . Hypertension Father   . COPD Father   . Heart disease Father 3       MI  . Cancer Father 31       prostate ca  . Colon cancer Neg Hx   . Pancreatic cancer Neg Hx   . Stomach cancer Neg Hx     Social History   Social History  .  Marital status: Married    Spouse name: N/A  . Number of children: N/A  . Years of education: N/A   Occupational History  . 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 Smoking status: Never Smoker  . Smokeless tobacco: Never Used  . Alcohol use 0.6 oz/week    1 Glasses of wine per week     Comment: r-occ. social 1 wine per week  . Drug use: No  . Sexual activity: Yes    Partners: Male   Other Topics Concern  . Not on file   Social History Narrative   Exercise-- zumba 4-5 x a week in summer,  2-3 x during school     Outpatient Medications Prior to Visit  Medication Sig Dispense Refill  . aspirin 81 MG EC tablet Take 81 mg by mouth every morning.     . cephALEXin (KEFLEX) 500 MG capsule Take 1 capsule (500 mg total) by mouth 4 (four) times daily. 40 capsule 0  . fish oil-omega-3 fatty acids 1000 MG capsule Take 1 g by mouth 2 (two) times  daily.     . Flaxseed, Linseed, (FLAX SEED OIL PO) Take by mouth.    . fluconazole (DIFLUCAN) 150 MG tablet 1 po qd x1, may repeat in 3 days prn 2 tablet 2  . LORazepam (ATIVAN) 0.5 MG tablet Take 1 tablet (0.5 mg total) by mouth every 8 (eight) hours as needed for anxiety. 90 tablet 1  . lovastatin (MEVACOR) 40 MG tablet TAKE 1 TABLET(40 MG) BY MOUTH AT BEDTIME 90 tablet 0  . Multiple Vitamin (MULTIVITAMIN WITH MINERALS) TABS tablet Take 1 tablet by mouth every morning.    Marland Kitchen NYSTATIN powder APPLY EXTERNALLY TO THE AFFECTED AREA THREE TIMES DAILY AS NEEDED 60 g 0  . polyvinyl alcohol (LIQUIFILM TEARS) 1.4 % ophthalmic solution Place 1 drop into both eyes daily as needed (For dry eyes.).    Marland Kitchen tolterodine (DETROL) 2 MG tablet Take 1 tablet (2 mg total) by mouth 2 (two) times daily. 60 tablet 11  . trimethoprim-polymyxin b (POLYTRIM) ophthalmic solution Apply 1-2 drops into affected eye 4 times per day x 5 days 10 mL 0  . valsartan-hydrochlorothiazide (DIOVAN-HCT) 320-25 MG tablet Take 1 tablet by mouth daily. 30 tablet 5    . valsartan-hydrochlorothiazide (DIOVAN-HCT) 320-25 MG tablet TAKE 1 TABLET BY MOUTH EVERY DAY 30 tablet 2  . fluticasone (FLONASE) 50 MCG/ACT nasal spray Place 2 sprays into both nostrils daily. (Patient not taking: Reported on 03/16/2017) 16 g 1   No facility-administered medications prior to visit.     Allergies  Allergen Reactions  . Other Swelling    Peaches(fresh) causes throat itching-swelling  . Codeine     Nausea & vomiting  . Penicillins     angioedema    Review of Systems  Constitutional: Negative for fever and malaise/fatigue.  HENT: Negative for congestion.   Eyes: Negative for blurred vision.  Respiratory: Negative for cough and shortness of breath.   Cardiovascular: Negative for chest pain, palpitations and leg swelling.  Gastrointestinal: Negative for vomiting.  Musculoskeletal: Negative for back pain.  Skin: Negative for rash.  Neurological: Negative for loss of consciousness and headaches.       Objective:    Physical Exam  Constitutional: She is oriented to person, place, and time. She appears well-developed and well-nourished. No distress.  HENT:  Head: Normocephalic and atraumatic.  Eyes: Conjunctivae are normal.  Neck: Normal range of motion. No thyromegaly present.  Cardiovascular: Normal rate and regular rhythm.   Pulmonary/Chest: Effort normal and breath sounds normal. She has no wheezes.  Abdominal: Soft. Bowel sounds are normal. There is no tenderness.  Musculoskeletal: Normal range of motion. She exhibits no edema or deformity.  Neurological: She is alert and oriented to person, place, and time.  Skin: Skin is warm and dry. She is not diaphoretic. There is erythema.     Psychiatric: She has a normal mood and affect. Her behavior is normal. Judgment and thought content normal.  Nursing note and vitals reviewed.   BP 126/82 (BP Location: Right Wrist, Cuff Size: Normal)   Pulse 70   Temp 98 F (36.7 C) (Oral)   Resp 16   Ht 5\' 8"  (1.727  m)   Wt (!) 378 lb 9.6 oz (171.7 kg)   LMP 06/26/2012   SpO2 98%   BMI 57.57 kg/m  Wt Readings from Last 3 Encounters:  03/30/17 (!) 378 lb 9.6 oz (171.7 kg)  03/16/17 (!) 374 lb (169.6 kg)  09/12/16 (!) 367 lb 9.6 oz (166.7 kg)   BP Readings  from Last 3 Encounters:  03/30/17 126/82  03/16/17 (!) 142/82  09/12/16 (!) 150/80     Immunization History  Administered Date(s) Administered  . Influenza Split 07/10/2011, 08/20/2012  . Influenza,inj,Quad PF,36+ Mos 07/03/2013, 07/26/2015  . Influenza-Unspecified 07/16/2014, 08/14/2016  . Tdap 01/08/2012  . Zoster 05/29/2014  . Zoster Recombinat (Shingrix) 03/30/2017    Health Maintenance  Topic Date Due  . INFLUENZA VACCINE  05/16/2017  . MAMMOGRAM  05/04/2018  . PAP SMEAR  07/25/2018  . COLONOSCOPY  08/18/2018  . TETANUS/TDAP  01/07/2022  . Hepatitis C Screening  Completed  . HIV Screening  Completed    Lab Results  Component Value Date   WBC 8.0 07/29/2015   HGB 12.7 07/29/2015   HCT 37.5 07/29/2015   PLT 200.0 07/29/2015   GLUCOSE 108 (H) 09/14/2016   CHOL 149 09/14/2016   TRIG 75.0 09/14/2016   HDL 51.60 09/14/2016   LDLCALC 83 09/14/2016   ALT 17 09/14/2016   AST 14 09/14/2016   NA 141 09/14/2016   K 3.7 09/14/2016   CL 103 09/14/2016   CREATININE 0.67 09/14/2016   BUN 16 09/14/2016   CO2 30 09/14/2016   TSH 2.22 07/29/2015   HGBA1C 5.5 08/14/2012   MICROALBUR 2.8 (H) 07/29/2015    Lab Results  Component Value Date   TSH 2.22 07/29/2015   Lab Results  Component Value Date   WBC 8.0 07/29/2015   HGB 12.7 07/29/2015   HCT 37.5 07/29/2015   MCV 95.2 07/29/2015   PLT 200.0 07/29/2015   Lab Results  Component Value Date   NA 141 09/14/2016   K 3.7 09/14/2016   CO2 30 09/14/2016   GLUCOSE 108 (H) 09/14/2016   BUN 16 09/14/2016   CREATININE 0.67 09/14/2016   BILITOT 0.9 09/14/2016   ALKPHOS 56 09/14/2016   AST 14 09/14/2016   ALT 17 09/14/2016   PROT 6.9 09/14/2016   ALBUMIN 4.2 09/14/2016    CALCIUM 9.1 09/14/2016   GFR 96.68 09/14/2016   Lab Results  Component Value Date   CHOL 149 09/14/2016   Lab Results  Component Value Date   HDL 51.60 09/14/2016   Lab Results  Component Value Date   LDLCALC 83 09/14/2016   Lab Results  Component Value Date   TRIG 75.0 09/14/2016   Lab Results  Component Value Date   CHOLHDL 3 09/14/2016   Lab Results  Component Value Date   HGBA1C 5.5 08/14/2012         Assessment & Plan:   Problem List Items Addressed This Visit      Unprioritized   Essential hypertension    Well controlled, no changes to meds. Encouraged heart healthy diet such as the DASH diet and exercise as tolerated.       Hyperlipidemia LDL goal <100 - Primary    Tolerating statin, encouraged heart healthy diet, avoid trans fats, minimize simple carbs and saturated fats. Increase exercise as tolerated      Relevant Orders   Lipid panel   Comprehensive metabolic panel    Other Visit Diagnoses    Need for shingles vaccine       Relevant Orders   Varicella-zoster vaccine IM (Shingrix) (Completed)      I have discontinued Ms. Bartow's fluticasone. I am also having her maintain her aspirin, fish oil-omega-3 fatty acids, polyvinyl alcohol, multivitamin with minerals, (Flaxseed, Linseed, (FLAX SEED OIL PO)), valsartan-hydrochlorothiazide, tolterodine, trimethoprim-polymyxin b, nystatin, valsartan-hydrochlorothiazide, lovastatin, LORazepam, cephALEXin, fluconazole, and GLUCOSAMINE-CHONDROITIN PO.  Meds  ordered this encounter  Medications  . GLUCOSAMINE-CHONDROITIN PO    Sig: Take 1 capsule by mouth 2 (two) times daily.    CMA served as Education administrator during this visit. History, Physical and Plan performed by medical provider. Documentation and orders reviewed and attested to.  [name], DO

## 2017-03-30 NOTE — Patient Instructions (Signed)

## 2017-03-30 NOTE — Assessment & Plan Note (Signed)
Tolerating statin, encouraged heart healthy diet, avoid trans fats, minimize simple carbs and saturated fats. Increase exercise as tolerated 

## 2017-03-30 NOTE — Assessment & Plan Note (Signed)
Well controlled, no changes to meds. Encouraged heart healthy diet such as the DASH diet and exercise as tolerated.  °

## 2017-04-06 ENCOUNTER — Other Ambulatory Visit (INDEPENDENT_AMBULATORY_CARE_PROVIDER_SITE_OTHER): Payer: BC Managed Care – PPO

## 2017-04-06 DIAGNOSIS — E785 Hyperlipidemia, unspecified: Secondary | ICD-10-CM | POA: Diagnosis not present

## 2017-04-06 LAB — LIPID PANEL
Cholesterol: 146 mg/dL (ref 0–200)
HDL: 46 mg/dL (ref >39.00)
LDL Cholesterol: 82 mg/dL (ref 0–99)
NonHDL: 99.87
Total CHOL/HDL Ratio: 3
Triglycerides: 91 mg/dL (ref 0.0–149.0)
VLDL: 18.2 mg/dL (ref 0.0–40.0)

## 2017-04-06 LAB — COMPREHENSIVE METABOLIC PANEL
ALBUMIN: 4.1 g/dL (ref 3.5–5.2)
ALT: 23 U/L (ref 0–35)
AST: 18 U/L (ref 0–37)
Alkaline Phosphatase: 53 U/L (ref 39–117)
BUN: 16 mg/dL (ref 6–23)
CALCIUM: 9.6 mg/dL (ref 8.4–10.5)
CHLORIDE: 105 meq/L (ref 96–112)
CO2: 27 mEq/L (ref 19–32)
CREATININE: 0.65 mg/dL (ref 0.40–1.20)
GFR: 99.92 mL/min (ref >60.00)
Glucose, Bld: 128 mg/dL — ABNORMAL HIGH (ref 70–99)
POTASSIUM: 4 meq/L (ref 3.5–5.1)
Sodium: 140 mEq/L (ref 135–145)
Total Bilirubin: 0.6 mg/dL (ref 0.2–1.2)
Total Protein: 6.5 g/dL (ref 6.0–8.3)

## 2017-04-10 ENCOUNTER — Encounter: Payer: Self-pay | Admitting: Family Medicine

## 2017-04-12 ENCOUNTER — Other Ambulatory Visit: Payer: Self-pay | Admitting: Family Medicine

## 2017-04-12 ENCOUNTER — Encounter: Payer: Self-pay | Admitting: Family Medicine

## 2017-04-12 DIAGNOSIS — F411 Generalized anxiety disorder: Secondary | ICD-10-CM

## 2017-04-12 MED ORDER — LORAZEPAM 0.5 MG PO TABS: 0.5000 mg | ORAL_TABLET | Freq: Three times a day (TID) | ORAL | 1 refills | Status: DC | PRN

## 2017-04-12 NOTE — Telephone Encounter (Signed)
Pt is requesting refill on lorazepam 0.5mg .  Last OV: 03/30/2017 Last Fill: 02/05/2017 #90 and 1RF Pt sig: 1 tab q8h prn UDS: 11/25/2013 Low risk  Please advise.   Database printed, placed on ledge.

## 2017-04-12 NOTE — Telephone Encounter (Signed)
Rx faxed to [facility] pharmacy.  

## 2017-04-12 NOTE — Telephone Encounter (Signed)
Refill printed 

## 2017-04-14 ENCOUNTER — Other Ambulatory Visit: Payer: Self-pay | Admitting: Family Medicine

## 2017-05-05 ENCOUNTER — Other Ambulatory Visit: Payer: Self-pay | Admitting: Family Medicine

## 2017-05-05 DIAGNOSIS — E785 Hyperlipidemia, unspecified: Secondary | ICD-10-CM

## 2017-05-08 ENCOUNTER — Ambulatory Visit (HOSPITAL_BASED_OUTPATIENT_CLINIC_OR_DEPARTMENT_OTHER)
Admission: RE | Admit: 2017-05-08 | Discharge: 2017-05-08 | Disposition: A | Discharge status: Home / Self Care | Payer: BC Managed Care – PPO | Source: Ambulatory Visit | Attending: Family Medicine | Admitting: Family Medicine

## 2017-05-08 ENCOUNTER — Encounter (HOSPITAL_BASED_OUTPATIENT_CLINIC_OR_DEPARTMENT_OTHER): Payer: Self-pay

## 2017-05-08 DIAGNOSIS — Z1231 Encounter for screening mammogram for malignant neoplasm of breast: Secondary | ICD-10-CM | POA: Insufficient documentation

## 2017-05-09 ENCOUNTER — Other Ambulatory Visit: Payer: Self-pay | Admitting: Family Medicine

## 2017-05-09 DIAGNOSIS — R928 Other abnormal and inconclusive findings on diagnostic imaging of breast: Secondary | ICD-10-CM

## 2017-05-11 ENCOUNTER — Telehealth: Payer: Self-pay | Admitting: *Deleted

## 2017-05-11 NOTE — Telephone Encounter (Signed)
Received Physician Orders from [place], forwarded to provider/SLS 07/27

## 2017-05-14 ENCOUNTER — Other Ambulatory Visit: Payer: Self-pay | Admitting: Family Medicine

## 2017-05-15 ENCOUNTER — Encounter: Payer: Self-pay | Admitting: Family Medicine

## 2017-05-15 ENCOUNTER — Other Ambulatory Visit: Payer: Self-pay | Admitting: Family Medicine

## 2017-05-15 ENCOUNTER — Ambulatory Visit
Admission: RE | Admit: 2017-05-15 | Discharge: 2017-05-15 | Disposition: A | Discharge status: Home / Self Care | Payer: BC Managed Care – PPO | Source: Ambulatory Visit | Attending: Family Medicine | Admitting: Family Medicine

## 2017-05-15 DIAGNOSIS — R928 Other abnormal and inconclusive findings on diagnostic imaging of breast: Secondary | ICD-10-CM

## 2017-05-15 DIAGNOSIS — N63 Unspecified lump in unspecified breast: Secondary | ICD-10-CM

## 2017-05-15 NOTE — Telephone Encounter (Signed)
Ok to refill meds until dec  If her mom is not due for ov please refill hers as well

## 2017-05-17 ENCOUNTER — Other Ambulatory Visit: Payer: Self-pay | Admitting: Family Medicine

## 2017-05-17 DIAGNOSIS — R32 Unspecified urinary incontinence: Secondary | ICD-10-CM

## 2017-05-17 DIAGNOSIS — E785 Hyperlipidemia, unspecified: Secondary | ICD-10-CM

## 2017-05-17 DIAGNOSIS — I10 Essential (primary) hypertension: Secondary | ICD-10-CM

## 2017-05-17 MED ORDER — VALSARTAN-HYDROCHLOROTHIAZIDE 320-25 MG PO TABS: 1.0000 | ORAL_TABLET | Freq: Every day | ORAL | 5 refills | Status: DC

## 2017-05-17 MED ORDER — TOLTERODINE TARTRATE 2 MG PO TABS: 2.0000 mg | ORAL_TABLET | Freq: Two times a day (BID) | ORAL | 3 refills | Status: DC

## 2017-05-17 MED ORDER — LOVASTATIN 40 MG PO TABS: ORAL_TABLET | ORAL | 1 refills | Status: DC

## 2017-06-01 ENCOUNTER — Ambulatory Visit (INDEPENDENT_AMBULATORY_CARE_PROVIDER_SITE_OTHER): Payer: BC Managed Care – PPO

## 2017-06-01 DIAGNOSIS — Z23 Encounter for immunization: Secondary | ICD-10-CM

## 2017-06-04 ENCOUNTER — Ambulatory Visit (INDEPENDENT_AMBULATORY_CARE_PROVIDER_SITE_OTHER): Payer: BC Managed Care – PPO | Admitting: Ophthalmology

## 2017-06-04 DIAGNOSIS — H2513 Age-related nuclear cataract, bilateral: Secondary | ICD-10-CM | POA: Diagnosis not present

## 2017-06-04 DIAGNOSIS — H35033 Hypertensive retinopathy, bilateral: Secondary | ICD-10-CM

## 2017-06-04 DIAGNOSIS — H43813 Vitreous degeneration, bilateral: Secondary | ICD-10-CM

## 2017-06-04 DIAGNOSIS — D3132 Benign neoplasm of left choroid: Secondary | ICD-10-CM | POA: Diagnosis not present

## 2017-06-04 DIAGNOSIS — I10 Essential (primary) hypertension: Secondary | ICD-10-CM | POA: Diagnosis not present

## 2017-06-13 ENCOUNTER — Encounter: Payer: Self-pay | Admitting: Family Medicine

## 2017-06-13 DIAGNOSIS — F411 Generalized anxiety disorder: Secondary | ICD-10-CM

## 2017-06-14 MED ORDER — LORAZEPAM 0.5 MG PO TABS: 0.5000 mg | ORAL_TABLET | Freq: Three times a day (TID) | ORAL | 0 refills | Status: DC | PRN

## 2017-06-14 NOTE — Telephone Encounter (Signed)
Refill x1 

## 2017-06-16 ENCOUNTER — Other Ambulatory Visit: Payer: Self-pay | Admitting: Family Medicine

## 2017-06-19 ENCOUNTER — Encounter: Payer: Self-pay | Admitting: Family Medicine

## 2017-06-19 NOTE — Telephone Encounter (Signed)
On 8.2.18/6 months worth was faxed/thx dmf

## 2017-06-21 ENCOUNTER — Encounter: Payer: Self-pay | Admitting: Family Medicine

## 2017-06-21 MED ORDER — OLMESARTAN MEDOXOMIL-HCTZ 40-25 MG PO TABS: 1.0000 | ORAL_TABLET | Freq: Every day | ORAL | 2 refills | Status: DC

## 2017-06-21 NOTE — Telephone Encounter (Signed)
Valsartan-hct recalled, mychart message sent to Pt about changing to Benicar-hct 40/25. Instructed Pt to call office to schedule nurse visit to check bp in 2-3 weeks.

## 2017-06-21 NOTE — Telephone Encounter (Signed)
Is it on recall list-- please ask pharmacy and explain to pt  if yes--- change to benicar 40/ 25 #30  1 po qd  2 refill s Ov 2-3 weeks for bp chec

## 2017-06-21 NOTE — Telephone Encounter (Signed)
Please call [facility] [address] [place]. [phone].  Fax [phone]. Pharmacy called states Pt needs Valsartan/hct  It looks like 05/14/17 script has confirmation it went to pharmacy.  The script dated 05/17/17 has no confirmation that it was received by pharmacy and it does not indicate that it was printed either. Please call pharm.

## 2017-06-21 NOTE — Telephone Encounter (Signed)
Please call pharmacy --- it is on recall list if so Change to benicar 40/

## 2017-07-12 ENCOUNTER — Encounter: Payer: Self-pay | Admitting: Family Medicine

## 2017-07-12 ENCOUNTER — Ambulatory Visit (INDEPENDENT_AMBULATORY_CARE_PROVIDER_SITE_OTHER): Payer: BC Managed Care – PPO | Admitting: Family Medicine

## 2017-07-12 VITALS — BP 136/84 | HR 59

## 2017-07-12 DIAGNOSIS — I10 Essential (primary) hypertension: Secondary | ICD-10-CM

## 2017-07-12 NOTE — Progress Notes (Signed)
Pre visit review using our clinic tool,if applicable. No additional management support is needed unless otherwise documented below in the visit note.   Patient in for BP check per order dated 06/21/17 by Dr. Carollee Herter. Patient taken off Valsartan-Hct and started on Benicar 40-25 mg.  Patient voices no complaints today. States she has had not problems since medication change.  BP today = 136/84 P= 59  Per Dr. Carollee Herter patient to continue medication as ordered. Return for office visit with provider in 3 months. Patient has appointment scheduled for 09/2016

## 2017-07-12 NOTE — Progress Notes (Signed)
Reviewed  Guido Comp R Lowne Chase, DO  

## 2017-07-17 ENCOUNTER — Encounter: Payer: Self-pay | Admitting: Family Medicine

## 2017-07-17 DIAGNOSIS — F411 Generalized anxiety disorder: Secondary | ICD-10-CM

## 2017-07-17 MED ORDER — LORAZEPAM 0.5 MG PO TABS
0.5000 mg | ORAL_TABLET | Freq: Three times a day (TID) | ORAL | 0 refills | Status: DC | PRN
Start: 2017-07-17 — End: 2017-08-16

## 2017-07-17 NOTE — Telephone Encounter (Signed)
Rx printed, awaiting MD signature.  

## 2017-07-17 NOTE — Telephone Encounter (Signed)
Rx faxed to [facility] pharmacy.  

## 2017-07-17 NOTE — Telephone Encounter (Signed)
Okay #90, no refills 

## 2017-07-17 NOTE — Telephone Encounter (Signed)
Pt is requesting refill on lorazepam 0.5mg . Lowne Pt.   Last OV: 07/12/2017 Last Fill: 06/14/2017 #90 and 0RF (Pt sig: 1 tab q8h prn) UDS: 05/29/2014 Low risk  Please advise.

## 2017-08-13 ENCOUNTER — Other Ambulatory Visit: Payer: Self-pay | Admitting: Family Medicine

## 2017-08-13 DIAGNOSIS — B354 Tinea corporis: Secondary | ICD-10-CM

## 2017-08-16 ENCOUNTER — Encounter: Payer: Self-pay | Admitting: Family Medicine

## 2017-08-16 DIAGNOSIS — F411 Generalized anxiety disorder: Secondary | ICD-10-CM

## 2017-08-16 MED ORDER — LORAZEPAM 0.5 MG PO TABS: 0.5000 mg | ORAL_TABLET | Freq: Three times a day (TID) | ORAL | 2 refills | Status: DC | PRN

## 2017-08-16 NOTE — Telephone Encounter (Signed)
Rx phoned into [facility], #90 and 2RF.

## 2017-08-16 NOTE — Telephone Encounter (Signed)
Pt is requesting refill on lorazepam 0.5mg .  Last OV: 07/12/2017 Last Fill: 07/17/2017 #90 and 0RF Pt sig: 1 tab q8h prn UDS: 05/29/2014 Low risk  NCCR placed on ledge.   Please advise.

## 2017-08-16 NOTE — Telephone Encounter (Signed)
Refill x1  2 refill 

## 2017-09-15 ENCOUNTER — Other Ambulatory Visit: Payer: Self-pay | Admitting: Family Medicine

## 2017-09-22 ENCOUNTER — Other Ambulatory Visit: Payer: Self-pay | Admitting: Family Medicine

## 2017-09-22 DIAGNOSIS — R32 Unspecified urinary incontinence: Secondary | ICD-10-CM

## 2017-09-25 ENCOUNTER — Encounter: Payer: Self-pay | Admitting: Family Medicine

## 2017-09-26 NOTE — Telephone Encounter (Signed)
She is a family member so I will take her

## 2017-10-01 ENCOUNTER — Ambulatory Visit: Payer: BC Managed Care – PPO | Admitting: Family Medicine

## 2017-10-01 ENCOUNTER — Encounter: Payer: Self-pay | Admitting: Family Medicine

## 2017-10-01 VITALS — BP 138/90 | HR 65 | Temp 98.1°F | Ht 68.0 in | Wt 349.0 lb

## 2017-10-01 DIAGNOSIS — R739 Hyperglycemia, unspecified: Secondary | ICD-10-CM

## 2017-10-01 DIAGNOSIS — E785 Hyperlipidemia, unspecified: Secondary | ICD-10-CM

## 2017-10-01 DIAGNOSIS — B354 Tinea corporis: Secondary | ICD-10-CM | POA: Diagnosis not present

## 2017-10-01 DIAGNOSIS — I10 Essential (primary) hypertension: Secondary | ICD-10-CM

## 2017-10-01 DIAGNOSIS — Z6841 Body Mass Index (BMI) 40.0 and over, adult: Secondary | ICD-10-CM | POA: Diagnosis not present

## 2017-10-01 LAB — COMPREHENSIVE METABOLIC PANEL
ALBUMIN: 4.1 g/dL (ref 3.5–5.2)
ALK PHOS: 51 U/L (ref 39–117)
ALT: 19 U/L (ref 0–35)
AST: 15 U/L (ref 0–37)
BILIRUBIN TOTAL: 1.1 mg/dL (ref 0.2–1.2)
BUN: 19 mg/dL (ref 6–23)
CALCIUM: 9 mg/dL (ref 8.4–10.5)
CO2: 30 meq/L (ref 19–32)
CREATININE: 0.67 mg/dL (ref 0.40–1.20)
Chloride: 103 mEq/L (ref 96–112)
GFR: 96.32 mL/min (ref >60.00)
Glucose, Bld: 100 mg/dL — ABNORMAL HIGH (ref 70–99)
Potassium: 3.7 mEq/L (ref 3.5–5.1)
Sodium: 141 mEq/L (ref 135–145)
TOTAL PROTEIN: 6.9 g/dL (ref 6.0–8.3)

## 2017-10-01 LAB — LIPID PANEL
CHOL/HDL RATIO: 3
CHOLESTEROL: 119 mg/dL (ref 0–200)
HDL: 47.1 mg/dL (ref >39.00)
LDL Cholesterol: 57 mg/dL (ref 0–99)
NonHDL: 71.41
TRIGLYCERIDES: 74 mg/dL (ref 0.0–149.0)
VLDL: 14.8 mg/dL (ref 0.0–40.0)

## 2017-10-01 LAB — HEMOGLOBIN A1C: Hgb A1c MFr Bld: 5.6 % (ref 4.6–6.5)

## 2017-10-01 MED ORDER — NYSTATIN 100000 UNIT/GM EX POWD: CUTANEOUS | 5 refills | Status: DC

## 2017-10-01 NOTE — Assessment & Plan Note (Signed)
Tolerating statin, encouraged heart healthy diet, avoid trans fats, minimize simple carbs and saturated fats. Increase exercise as tolerated 

## 2017-10-01 NOTE — Patient Instructions (Signed)

## 2017-10-01 NOTE — Assessment & Plan Note (Signed)
Well controlled, no changes to meds. Encouraged heart healthy diet such as the DASH diet and exercise as tolerated.  °

## 2017-10-01 NOTE — Progress Notes (Signed)
Subjective:  I acted as a Education administrator for [employer], [place]   Patient ID: Pam Phillips, female    DOB: 1960-11-08, 56 y.o.   MRN: [id number]  Chief Complaint  Patient presents with  . Follow-up  . Hyperlipidemia    HPI  Patient is in today for f/u cholesterol and htn.   No complaints.    Patient Care Team: Carollee Herter, Alferd Apa, DO as PCP - General   Past Medical History:  Diagnosis Date  . Ankle swelling 07-04-12   mostly left ankle -wears compression hose occ.  Marland Kitchen Anxiety   . Cancer ([place]) 07-04-12   dx. Melanoma in situ -back(mid-scapula area)  . Heart murmur   . Hyperlipidemia   . Hypertension     Past Surgical History:  Procedure Laterality Date  . CHOLECYSTECTOMY  07-04-12   '03-Lap. due to gallstones-[place]  . COLONOSCOPY N/A 08/18/2013   Procedure: COLONOSCOPY;  Surgeon: Jerene Bears, MD;  Location: [facility];  Service: Gastroenterology;  Laterality: N/A;  . DENTAL SURGERY  07-04-12   2 wisdom teeth and 2 other teeth  . MELANOMA EXCISION  07/10/2012   Procedure: MELANOMA EXCISION;  Surgeon: Stark Klein, MD;  Location: [facility];  Service: General;  Laterality: N/A;  excision of back melanoma insitu    Family History  Problem Relation Age of Onset  . Hypertension Father   . COPD Father   . Heart disease Father 82       MI  . Cancer Father 25       prostate ca  . Coronary artery disease Unknown 19       1st degree female  . Colon cancer Neg Hx   . Pancreatic cancer Neg Hx   . Stomach cancer Neg Hx     Social History   Socioeconomic History  . Marital status: Married    Spouse name: Not on file  . Number of children: Not on file  . Years of education: Not on file  . Highest education level: Not on file  Social Needs  . Financial resource strain: Not on file  . Food insecurity - worry: Not on file  . Food insecurity - inability: Not on file  . Transportation needs - medical: Not on file  . Transportation needs - non-medical: Not on file    Occupational History  . Occupation: Environmental health practitioner: [employer]  Tobacco Use  . Smoking status: Never Smoker  . Smokeless tobacco: Never Used  Substance and Sexual Activity  . Alcohol use: Yes    Alcohol/week: 0.6 oz    Types: 1 Glasses of wine per week    Comment: r-occ. social 1 wine per week  . Drug use: No  . Sexual activity: Yes    Partners: Male  Other Topics Concern  . Not on file  Social History Narrative   Exercise-- zumba 4-5 x a week in summer,  2-3 x during school     Outpatient Medications Prior to Visit  Medication Sig Dispense Refill  . aspirin 81 MG EC tablet Take 81 mg by mouth every morning.     . fish oil-omega-3 fatty acids 1000 MG capsule Take 1 g by mouth 2 (two) times daily.     . Flaxseed, Linseed, (FLAX SEED OIL PO) Take by mouth.    Marland Kitchen GLUCOSAMINE-CHONDROITIN PO Take 1 capsule by mouth 2 (two) times daily.    Marland Kitchen LORazepam (ATIVAN) 0.5 MG tablet Take 1  tablet (0.5 mg total) by mouth every 8 (eight) hours as needed for anxiety. 90 tablet 2  . lovastatin (MEVACOR) 40 MG tablet TAKE 1 TABLET(40 MG) BY MOUTH AT BEDTIME 90 tablet 1  . Multiple Vitamin (MULTIVITAMIN WITH MINERALS) TABS tablet Take 1 tablet by mouth every morning.    . olmesartan-hydrochlorothiazide (BENICAR HCT) 40-25 MG tablet TAKE 1 TABLET BY MOUTH DAILY 30 tablet 0  . polyvinyl alcohol (LIQUIFILM TEARS) 1.4 % ophthalmic solution Place 1 drop into both eyes daily as needed (For dry eyes.).    Marland Kitchen tolterodine (DETROL) 2 MG tablet TAKE 1 TABLET(2 MG) BY MOUTH TWICE DAILY 60 tablet 0  . NYSTATIN powder APPLY EXTERNALLY TO THE AFFECTED AREA THREE TIMES DAILY AS NEEDED 60 g 0   No facility-administered medications prior to visit.     Allergies  Allergen Reactions  . Other Swelling    Peaches(fresh) causes throat itching-swelling  . Codeine     Nausea & vomiting  . Penicillins     angioedema    Review of Systems  Constitutional: Negative for chills, fever and  malaise/fatigue.  HENT: Negative for congestion and hearing loss.   Eyes: Negative for discharge.  Respiratory: Negative for cough, sputum production and shortness of breath.   Cardiovascular: Negative for chest pain, palpitations and leg swelling.  Gastrointestinal: Negative for abdominal pain, blood in stool, constipation, diarrhea, heartburn, nausea and vomiting.  Genitourinary: Negative for dysuria, frequency, hematuria and urgency.  Musculoskeletal: Negative for back pain, falls and myalgias.  Skin: Negative for rash.  Neurological: Negative for dizziness, sensory change, loss of consciousness, weakness and headaches.  Endo/Heme/Allergies: Negative for environmental allergies. Does not bruise/bleed easily.  Psychiatric/Behavioral: Negative for depression and suicidal ideas. The patient is not nervous/anxious and does not have insomnia.        Objective:    Physical Exam  Constitutional: She is oriented to person, place, and time. She appears well-developed and well-nourished.  HENT:  Head: Normocephalic and atraumatic.  Eyes: Conjunctivae and EOM are normal.  Neck: Normal range of motion. Neck supple. No JVD present. Carotid bruit is not present. No thyromegaly present.  Cardiovascular: Normal rate, regular rhythm and normal heart sounds.  No murmur heard. Pulmonary/Chest: Effort normal and breath sounds normal. No respiratory distress. She has no wheezes. She has no rales. She exhibits no tenderness.  Musculoskeletal: She exhibits no edema.  Neurological: She is alert and oriented to person, place, and time.  Psychiatric: She has a normal mood and affect. Her behavior is normal. Judgment and thought content normal.  Nursing note and vitals reviewed.   BP 138/90   Pulse 65   Temp 98.1 F (36.7 C) (Oral)   Ht 5\' 8"  (1.727 m)   Wt (!) 349 lb (158.3 kg)   LMP 06/26/2012   SpO2 98%   BMI 53.07 kg/m  Wt Readings from Last 3 Encounters:  10/01/17 (!) 349 lb (158.3 kg)    03/30/17 (!) 378 lb 9.6 oz (171.7 kg)  03/16/17 (!) 374 lb (169.6 kg)   BP Readings from Last 3 Encounters:  10/01/17 138/90  07/12/17 136/84  03/30/17 126/82     Immunization History  Administered Date(s) Administered  . Influenza Split 07/10/2011, 08/20/2012  . Influenza,inj,Quad PF,6+ Mos 07/03/2013, 07/26/2015  . Influenza-Unspecified 07/16/2014, 08/14/2016  . Tdap 01/08/2012  . Zoster 05/29/2014  . Zoster Recombinat (Shingrix) 03/30/2017, 06/01/2017    Health Maintenance  Topic Date Due  . PAP SMEAR  07/25/2018  . COLONOSCOPY  08/18/2018  .  MAMMOGRAM  05/09/2019  . TETANUS/TDAP  01/07/2022  . INFLUENZA VACCINE  Completed  . Hepatitis C Screening  Completed  . HIV Screening  Completed    Lab Results  Component Value Date   WBC 8.0 07/29/2015   HGB 12.7 07/29/2015   HCT 37.5 07/29/2015   PLT 200.0 07/29/2015   GLUCOSE 128 (H) 04/06/2017   CHOL 146 04/06/2017   TRIG 91.0 04/06/2017   HDL 46.00 04/06/2017   LDLCALC 82 04/06/2017   ALT 23 04/06/2017   AST 18 04/06/2017   NA 140 04/06/2017   K 4.0 04/06/2017   CL 105 04/06/2017   CREATININE 0.65 04/06/2017   BUN 16 04/06/2017   CO2 27 04/06/2017   TSH 2.22 07/29/2015   HGBA1C 5.5 08/14/2012   MICROALBUR 2.8 (H) 07/29/2015    Lab Results  Component Value Date   TSH 2.22 07/29/2015   Lab Results  Component Value Date   WBC 8.0 07/29/2015   HGB 12.7 07/29/2015   HCT 37.5 07/29/2015   MCV 95.2 07/29/2015   PLT 200.0 07/29/2015   Lab Results  Component Value Date   NA 140 04/06/2017   K 4.0 04/06/2017   CO2 27 04/06/2017   GLUCOSE 128 (H) 04/06/2017   BUN 16 04/06/2017   CREATININE 0.65 04/06/2017   BILITOT 0.6 04/06/2017   ALKPHOS 53 04/06/2017   AST 18 04/06/2017   ALT 23 04/06/2017   PROT 6.5 04/06/2017   ALBUMIN 4.1 04/06/2017   CALCIUM 9.6 04/06/2017   GFR 99.92 04/06/2017   Lab Results  Component Value Date   CHOL 146 04/06/2017   Lab Results  Component Value Date   HDL 46.00  04/06/2017   Lab Results  Component Value Date   LDLCALC 82 04/06/2017   Lab Results  Component Value Date   TRIG 91.0 04/06/2017   Lab Results  Component Value Date   CHOLHDL 3 04/06/2017   Lab Results  Component Value Date   HGBA1C 5.5 08/14/2012         Assessment & Plan:   Problem List Items Addressed This Visit      Unprioritized   Essential hypertension    Well controlled, no changes to meds. Encouraged heart healthy diet such as the DASH diet and exercise as tolerated.       Hyperlipidemia LDL goal <100    Tolerating statin, encouraged heart healthy diet, avoid trans fats, minimize simple carbs and saturated fats. Increase exercise as tolerated      Morbid obesity with BMI of 50.0-59.9, adult ([facility])    con't WW and exercise       Other Visit Diagnoses    Tinea corporis       Relevant Medications   nystatin (NYSTATIN) powder      I have changed Nadirah E. Swatzell's nystatin. I am also having her maintain her aspirin, fish oil-omega-3 fatty acids, polyvinyl alcohol, multivitamin with minerals, (Flaxseed, Linseed, (FLAX SEED OIL PO)), GLUCOSAMINE-CHONDROITIN PO, lovastatin, LORazepam, olmesartan-hydrochlorothiazide, and tolterodine.  Meds ordered this encounter  Medications  . nystatin (NYSTATIN) powder    Sig: APPLY EXTERNALLY TO THE AFFECTED AREA THREE TIMES DAILY AS NEEDED    Dispense:  60 g    Refill:  5    CMA served as scribe during this visit. History, Physical and Plan performed by medical provider. Documentation and orders reviewed and attested to.  [name], DO

## 2017-10-01 NOTE — Assessment & Plan Note (Signed)
con't [employer] and exercise

## 2017-10-03 ENCOUNTER — Other Ambulatory Visit: Payer: Self-pay

## 2017-10-03 DIAGNOSIS — E785 Hyperlipidemia, unspecified: Secondary | ICD-10-CM

## 2017-10-12 ENCOUNTER — Other Ambulatory Visit: Payer: Self-pay | Admitting: Family Medicine

## 2017-10-12 MED ORDER — OLMESARTAN MEDOXOMIL-HCTZ 40-25 MG PO TABS
1.0000 | ORAL_TABLET | Freq: Every day | ORAL | 5 refills | Status: DC
Start: 2017-10-12 — End: 2018-04-13

## 2017-10-27 ENCOUNTER — Other Ambulatory Visit: Payer: Self-pay | Admitting: Family Medicine

## 2017-10-27 DIAGNOSIS — R32 Unspecified urinary incontinence: Secondary | ICD-10-CM

## 2017-11-15 ENCOUNTER — Encounter: Payer: Self-pay | Admitting: Family Medicine

## 2017-11-15 ENCOUNTER — Other Ambulatory Visit: Payer: Self-pay | Admitting: Family Medicine

## 2017-11-15 DIAGNOSIS — F411 Generalized anxiety disorder: Secondary | ICD-10-CM

## 2017-11-15 MED ORDER — LORAZEPAM 0.5 MG PO TABS: 0.5000 mg | ORAL_TABLET | Freq: Three times a day (TID) | ORAL | 2 refills | Status: DC | PRN

## 2017-11-15 NOTE — Telephone Encounter (Signed)
Requesting:lorazepam Contract: Needs updated CSC UDS: Needs updated UDS Last Visit: 10/01/17 Next Visit: 04/05/18 Last Refill:  08/16/17 2 refills  Please Advise  I will send patient mychart message to inform her she needs updated UDS and CSC

## 2017-11-23 ENCOUNTER — Other Ambulatory Visit: Payer: Self-pay | Admitting: Family Medicine

## 2017-11-23 ENCOUNTER — Ambulatory Visit
Admission: RE | Admit: 2017-11-23 | Discharge: 2017-11-23 | Disposition: A | Discharge status: Home / Self Care | Payer: BC Managed Care – PPO | Source: Ambulatory Visit | Attending: Family Medicine | Admitting: Family Medicine

## 2017-11-23 DIAGNOSIS — N63 Unspecified lump in unspecified breast: Secondary | ICD-10-CM

## 2017-11-24 ENCOUNTER — Other Ambulatory Visit: Payer: Self-pay | Admitting: Family Medicine

## 2017-11-24 DIAGNOSIS — R32 Unspecified urinary incontinence: Secondary | ICD-10-CM

## 2017-12-22 ENCOUNTER — Other Ambulatory Visit: Payer: Self-pay | Admitting: Family Medicine

## 2017-12-22 DIAGNOSIS — R32 Unspecified urinary incontinence: Secondary | ICD-10-CM

## 2018-02-18 ENCOUNTER — Encounter: Payer: Self-pay | Admitting: Family Medicine

## 2018-02-19 NOTE — Telephone Encounter (Signed)
Please advise?  Last lorazepam RX: 11/15/17, #90 x 2 refills Last OV:  10/01/17 Next OV: 04/05/18 UDS: 05/29/14, low risk. Past due CSC: 02/17/13, past due CSR: No discrepancies identified

## 2018-02-20 ENCOUNTER — Other Ambulatory Visit: Payer: Self-pay | Admitting: Family Medicine

## 2018-02-20 DIAGNOSIS — F411 Generalized anxiety disorder: Secondary | ICD-10-CM

## 2018-02-20 MED ORDER — LORAZEPAM 0.5 MG PO TABS: 0.5000 mg | ORAL_TABLET | Freq: Three times a day (TID) | ORAL | 2 refills | Status: DC | PRN

## 2018-02-20 NOTE — Telephone Encounter (Signed)
Ok to refill x 1 -- i'll send it in But she needs to come in for uds and contract for another refill

## 2018-02-20 NOTE — Telephone Encounter (Signed)
My chart message sent to pt.

## 2018-02-23 ENCOUNTER — Other Ambulatory Visit: Payer: Self-pay | Admitting: Family Medicine

## 2018-02-23 DIAGNOSIS — E785 Hyperlipidemia, unspecified: Secondary | ICD-10-CM

## 2018-03-13 ENCOUNTER — Encounter: Payer: Self-pay | Admitting: Family Medicine

## 2018-04-05 ENCOUNTER — Ambulatory Visit (INDEPENDENT_AMBULATORY_CARE_PROVIDER_SITE_OTHER): Payer: BC Managed Care – PPO | Admitting: Family Medicine

## 2018-04-05 ENCOUNTER — Encounter: Payer: Self-pay | Admitting: Family Medicine

## 2018-04-05 VITALS — BP 132/66 | HR 52 | Temp 98.1°F | Resp 16 | Ht 68.0 in | Wt 345.6 lb

## 2018-04-05 DIAGNOSIS — Z0000 Encounter for general adult medical examination without abnormal findings: Secondary | ICD-10-CM | POA: Diagnosis not present

## 2018-04-05 DIAGNOSIS — I10 Essential (primary) hypertension: Secondary | ICD-10-CM | POA: Diagnosis not present

## 2018-04-05 DIAGNOSIS — F419 Anxiety disorder, unspecified: Secondary | ICD-10-CM

## 2018-04-05 DIAGNOSIS — D0359 Melanoma in situ of other part of trunk: Secondary | ICD-10-CM

## 2018-04-05 DIAGNOSIS — Z79899 Other long term (current) drug therapy: Secondary | ICD-10-CM

## 2018-04-05 DIAGNOSIS — E785 Hyperlipidemia, unspecified: Secondary | ICD-10-CM

## 2018-04-05 DIAGNOSIS — Z6841 Body Mass Index (BMI) 40.0 and over, adult: Secondary | ICD-10-CM | POA: Diagnosis not present

## 2018-04-05 LAB — CBC WITH DIFFERENTIAL/PLATELET
BASOS PCT: 0.6 % (ref 0.0–3.0)
Basophils Absolute: 0 10*3/uL (ref 0.0–0.1)
EOS PCT: 1.7 % (ref 0.0–5.0)
Eosinophils Absolute: 0.1 10*3/uL (ref 0.0–0.7)
HCT: 36.5 % (ref 36.0–46.0)
Hemoglobin: 12.4 g/dL (ref 12.0–15.0)
LYMPHS ABS: 1.9 10*3/uL (ref 0.7–4.0)
Lymphocytes Relative: 27.1 % (ref 12.0–46.0)
MCHC: 34.1 g/dL (ref 30.0–36.0)
MCV: 94 fl (ref 78.0–100.0)
Monocytes Absolute: 0.5 10*3/uL (ref 0.1–1.0)
Monocytes Relative: 6.7 % (ref 3.0–12.0)
NEUTROS ABS: 4.5 10*3/uL (ref 1.4–7.7)
NEUTROS PCT: 63.9 % (ref 43.0–77.0)
Platelets: 176 10*3/uL (ref 150.0–400.0)
RBC: 3.88 Mil/uL (ref 3.87–5.11)
RDW: 12.5 % (ref 11.5–15.5)
WBC: 7 10*3/uL (ref 4.0–10.5)

## 2018-04-05 LAB — COMPREHENSIVE METABOLIC PANEL
ALK PHOS: 55 U/L (ref 39–117)
ALT: 15 U/L (ref 0–35)
AST: 14 U/L (ref 0–37)
Albumin: 4.1 g/dL (ref 3.5–5.2)
BUN: 15 mg/dL (ref 6–23)
CHLORIDE: 104 meq/L (ref 96–112)
CO2: 27 mEq/L (ref 19–32)
Calcium: 9.3 mg/dL (ref 8.4–10.5)
Creatinine, Ser: 0.63 mg/dL (ref 0.40–1.20)
GFR: 103.22 mL/min (ref >60.00)
GLUCOSE: 98 mg/dL (ref 70–99)
POTASSIUM: 3.9 meq/L (ref 3.5–5.1)
SODIUM: 141 meq/L (ref 135–145)
TOTAL PROTEIN: 6.7 g/dL (ref 6.0–8.3)
Total Bilirubin: 0.9 mg/dL (ref 0.2–1.2)

## 2018-04-05 LAB — LIPID PANEL
CHOLESTEROL: 138 mg/dL (ref 0–200)
HDL: 49 mg/dL (ref >39.00)
LDL CALC: 78 mg/dL (ref 0–99)
NONHDL: 89.4
Total CHOL/HDL Ratio: 3
Triglycerides: 58 mg/dL (ref 0.0–149.0)
VLDL: 11.6 mg/dL (ref 0.0–40.0)

## 2018-04-05 LAB — TSH: TSH: 1.46 u[IU]/mL (ref 0.35–4.50)

## 2018-04-05 NOTE — Assessment & Plan Note (Signed)
Pt on weight watchers  Has lost 30 lbs

## 2018-04-05 NOTE — Assessment & Plan Note (Signed)
Well controlled, no changes to meds. Encouraged heart healthy diet such as the DASH diet and exercise as tolerated.  °

## 2018-04-05 NOTE — Progress Notes (Signed)
Subjective:     Pam Phillips is a 57 y.o. female and is here for a comprehensive physical exam. The patient reports no problems.  Social History   Socioeconomic History  . Marital status: Married    Spouse name: Not on file  . Number of children: Not on file  . Years of education: Not on file  . Highest education level: Not on file  Occupational History  . Occupation: Environmental health practitioner: [employer]  Social Needs  . Financial resource strain: Not on file  . Food insecurity:    Worry: Not on file    Inability: Not on file  . Transportation needs:    Medical: Not on file    Non-medical: Not on file  Tobacco Use  . Smoking status: Never Smoker  . Smokeless tobacco: Never Used  Substance and Sexual Activity  . Alcohol use: Yes    Alcohol/week: 0.6 oz    Types: 1 Glasses of wine per week    Comment: r-occ. social 1 wine per week  . Drug use: No  . Sexual activity: Yes    Partners: Male  Lifestyle  . Physical activity:    Days per week: Not on file    Minutes per session: Not on file  . Stress: Not on file  Relationships  . Social connections:    Talks on phone: Not on file    Gets together: Not on file    Attends religious service: Not on file    Active member of club or organization: Not on file    Attends meetings of clubs or organizations: Not on file    Relationship status: Not on file  . Intimate partner violence:    Fear of current or ex partner: Not on file    Emotionally abused: Not on file    Physically abused: Not on file    Forced sexual activity: Not on file  Other Topics Concern  . Not on file  Social History Narrative   Exercise-- zumba 4-5 x a week in summer,  2-3 x during school    Health Maintenance  Topic Date Due  . MAMMOGRAM  05/08/2018  . INFLUENZA VACCINE  05/16/2018  . PAP SMEAR  07/25/2018  . COLONOSCOPY  08/18/2018  . TETANUS/TDAP  01/07/2022  . Hepatitis C Screening  Completed  . HIV Screening  Completed     The following portions of the patient's history were reviewed and updated as appropriate:  She  has a past medical history of Ankle swelling (07-04-12), Anxiety, Cancer ([place]) (07-04-12), Heart murmur, Hyperlipidemia, and Hypertension. She does not have any pertinent problems on file. She  has a past surgical history that includes Dental surgery (07-04-12); Melanoma excision (07/10/2012); Cholecystectomy (07-04-12); and Colonoscopy (N/A, 08/18/2013). Her family history includes Alzheimer's disease in her mother; COPD in her father; Cancer (age of onset: 73) in her father; Colon cancer in her mother; Coronary artery disease (age of onset: 41) in her unknown relative; Heart disease (age of onset: 22) in her father; Hypertension in her father. She  reports that she has never smoked. She has never used smokeless tobacco. She reports that she drinks about 0.6 oz of alcohol per week. She reports that she does not use drugs. She has a current medication list which includes the following prescription(s): aspirin, fish oil-omega-3 fatty acids, flaxseed (linseed), glucosamine-chondroitin, lorazepam, lovastatin, multivitamin with minerals, nystatin, olmesartan-hydrochlorothiazide, polyvinyl alcohol, tolterodine, and tolterodine. Current Outpatient Medications on File Prior  to Visit  Medication Sig Dispense Refill  . aspirin 81 MG EC tablet Take 81 mg by mouth every morning.     . fish oil-omega-3 fatty acids 1000 MG capsule Take 1 g by mouth 2 (two) times daily.     . Flaxseed, Linseed, (FLAX SEED OIL PO) Take by mouth.    Marland Kitchen GLUCOSAMINE-CHONDROITIN PO Take 1 capsule by mouth 2 (two) times daily.    Marland Kitchen LORazepam (ATIVAN) 0.5 MG tablet Take 1 tablet (0.5 mg total) by mouth every 8 (eight) hours as needed for anxiety. 90 tablet 2  . lovastatin (MEVACOR) 40 MG tablet TAKE 1 TABLET(40 MG) BY MOUTH AT BEDTIME 90 tablet 0  . Multiple Vitamin (MULTIVITAMIN WITH MINERALS) TABS tablet Take 1 tablet by mouth every morning.     . nystatin (NYSTATIN) powder APPLY EXTERNALLY TO THE AFFECTED AREA THREE TIMES DAILY AS NEEDED 60 g 5  . olmesartan-hydrochlorothiazide (BENICAR HCT) 40-25 MG tablet Take 1 tablet by mouth daily. 30 tablet 5  . polyvinyl alcohol (LIQUIFILM TEARS) 1.4 % ophthalmic solution Place 1 drop into both eyes daily as needed (For dry eyes.).    Marland Kitchen tolterodine (DETROL) 2 MG tablet TAKE 1 TABLET(2 MG) BY MOUTH TWICE DAILY 180 tablet 1  . [DISCONTINUED] tolterodine (DETROL) 2 MG tablet Take 1 tablet (2 mg total) by mouth 2 (two) times daily. 60 tablet 2   No current facility-administered medications on file prior to visit.    She is allergic to other; codeine; and penicillins..  Review of Systems Review of Systems  Constitutional: Negative for activity change, appetite change and fatigue.  HENT: Negative for hearing loss, congestion, tinnitus and ear discharge.  dentist q61m Eyes: Negative for visual disturbance (see optho q1y -- vision corrected to 20/20 with glasses).  Respiratory: Negative for cough, chest tightness and shortness of breath.   Cardiovascular: Negative for chest pain, palpitations and leg swelling.  Gastrointestinal: Negative for abdominal pain, diarrhea, constipation and abdominal distention.  Genitourinary: Negative for urgency, frequency, decreased urine volume and difficulty urinating.  Musculoskeletal: Negative for back pain, arthralgias and gait problem.  Skin: Negative for color change, pallor and rash.  Neurological: Negative for dizziness, light-headedness, numbness and headaches.  Hematological: Negative for adenopathy. Does not bruise/bleed easily.  Psychiatric/Behavioral: Negative for suicidal ideas, confusion, sleep disturbance, self-injury, dysphoric mood, decreased concentration and agitation.       Objective:    BP 132/66   Pulse (!) 52   Temp 98.1 F (36.7 C) (Oral)   Resp 16   Ht 5\' 8"  (1.727 m)   Wt (!) 345 lb 9.6 oz (156.8 kg)   LMP 06/26/2012   SpO2  97%   BMI 52.55 kg/m  General appearance: alert, cooperative, appears stated age and no distress Head: Normocephalic, without obvious abnormality, atraumatic Eyes: negative findings: lids and lashes normal, conjunctivae and sclerae normal and pupils equal, round, reactive to light and accomodation Ears: normal TM's and external ear canals both ears Nose: Nares normal. Septum midline. Mucosa normal. No drainage or sinus tenderness. Throat: lips, mucosa, and tongue normal; teeth and gums normal Neck: no adenopathy, no carotid bruit, no JVD, supple, symmetrical, trachea midline and thyroid not enlarged, symmetric, no tenderness/mass/nodules Back: symmetric, no curvature. ROM normal. No CVA tenderness. Lungs: clear to auscultation bilaterally Breasts: normal appearance, no masses or tenderness Heart: regular rate and rhythm, S1, S2 normal, no murmur, click, rub or gallop Abdomen: soft, non-tender; bowel sounds normal; no masses,  no organomegaly Pelvic: deferred Extremities: extremities  normal, atraumatic, no cyanosis or edema Pulses: 2+ and symmetric Skin: Skin color, texture, turgor normal. No rashes or lesions Lymph nodes: Cervical, supraclavicular, and axillary nodes normal. Neurologic: Alert and oriented X 3, normal strength and tone. Normal symmetric reflexes. Normal coordination and gait    Assessment:    Healthy female exam.     Plan:    ghm utd Check labs  See After Visit Summary for Counseling Recommendations    1. Anxiety Stable  con't ativan prn  - Pain Mgmt, Profile 8 w/Conf, U  2. High risk medication use   - Pain Mgmt, Profile 8 w/Conf, U  3. Essential hypertension Well controlled, no changes to meds. Encouraged heart healthy diet such as the DASH diet and exercise as tolerated.  - CBC with Differential/Platelet - Comprehensive metabolic panel - Lipid panel - TSH  4. Hyperlipidemia LDL goal <100 Tolerating statin, encouraged heart healthy diet, avoid trans  fats, minimize simple carbs and saturated fats. Increase exercise as tolerated - Comprehensive metabolic panel - Lipid panel  5. Preventative health care See above  6. Morbid obesity with BMI of 50.0-59.9, adult ([place]) con't weight watchers   7. Melanoma in situ of back [facility]) Per derm

## 2018-04-05 NOTE — Patient Instructions (Signed)
Preventive Care 40-64 Years, Female Preventive care refers to lifestyle choices and visits with your health care provider that can promote health and wellness. What does preventive care include?  A yearly physical exam. This is also called an annual well check.  Dental exams once or twice a year.  Routine eye exams. Ask your health care provider how often you should have your eyes checked.  Personal lifestyle choices, including: ? Daily care of your teeth and gums. ? Regular physical activity. ? Eating a healthy diet. ? Avoiding tobacco and drug use. ? Limiting alcohol use. ? Practicing safe sex. ? Taking low-dose aspirin daily starting at age 58. ? Taking vitamin and mineral supplements as recommended by your health care provider. What happens during an annual well check? The services and screenings done by your health care provider during your annual well check will depend on your age, overall health, lifestyle risk factors, and family history of disease. Counseling Your health care provider may ask you questions about your:  Alcohol use.  Tobacco use.  Drug use.  Emotional well-being.  Home and relationship well-being.  Sexual activity.  Eating habits.  Work and work Statistician.  Method of birth control.  Menstrual cycle.  Pregnancy history.  Screening You may have the following tests or measurements:  Height, weight, and BMI.  Blood pressure.  Lipid and cholesterol levels. These may be checked every 5 years, or more frequently if you are over 81 years old.  Skin check.  Lung cancer screening. You may have this screening every year starting at age 78 if you have a 30-pack-year history of smoking and currently smoke or have quit within the past 15 years.  Fecal occult blood test (FOBT) of the stool. You may have this test every year starting at age 65.  Flexible sigmoidoscopy or colonoscopy. You may have a sigmoidoscopy every 5 years or a colonoscopy  every 10 years starting at age 30.  Hepatitis C blood test.  Hepatitis B blood test.  Sexually transmitted disease (STD) testing.  Diabetes screening. This is done by checking your blood sugar (glucose) after you have not eaten for a while (fasting). You may have this done every 1-3 years.  Mammogram. This may be done every 1-2 years. Talk to your health care provider about when you should start having regular mammograms. This may depend on whether you have a family history of breast cancer.  BRCA-related cancer screening. This may be done if you have a family history of breast, ovarian, tubal, or peritoneal cancers.  Pelvic exam and Pap test. This may be done every 3 years starting at age 80. Starting at age 36, this may be done every 5 years if you have a Pap test in combination with an HPV test.  Bone density scan. This is done to screen for osteoporosis. You may have this scan if you are at high risk for osteoporosis.  Discuss your test results, treatment options, and if necessary, the need for more tests with your health care provider. Vaccines Your health care provider may recommend certain vaccines, such as:  Influenza vaccine. This is recommended every year.  Tetanus, diphtheria, and acellular pertussis (Tdap, Td) vaccine. You may need a Td booster every 10 years.  Varicella vaccine. You may need this if you have not been vaccinated.  Zoster vaccine. You may need this after age 5.  Measles, mumps, and rubella (MMR) vaccine. You may need at least one dose of MMR if you were born in  1958 or later. You may also need a second dose.  Pneumococcal 13-valent conjugate (PCV13) vaccine. You may need this if you have certain conditions and were not previously vaccinated.  Pneumococcal polysaccharide (PPSV23) vaccine. You may need one or two doses if you smoke cigarettes or if you have certain conditions.  Meningococcal vaccine. You may need this if you have certain  conditions.  Hepatitis A vaccine. You may need this if you have certain conditions or if you travel or work in places where you may be exposed to hepatitis A.  Hepatitis B vaccine. You may need this if you have certain conditions or if you travel or work in places where you may be exposed to hepatitis B.  Haemophilus influenzae type b (Hib) vaccine. You may need this if you have certain conditions.  Talk to your health care provider about which screenings and vaccines you need and how often you need them. This information is not intended to replace advice given to you by your health care provider. Make sure you discuss any questions you have with your health care provider. Document Released: 10/29/2015 Document Revised: 06/21/2016 Document Reviewed: 08/03/2015 Elsevier Interactive Patient Education  2018 [employer]  

## 2018-04-05 NOTE — Assessment & Plan Note (Signed)
Tolerating statin, encouraged heart healthy diet, avoid trans fats, minimize simple carbs and saturated fats. Increase exercise as tolerated 

## 2018-04-07 LAB — PAIN MGMT, PROFILE 8 W/CONF, U
6 ACETYLMORPHINE: NEGATIVE ng/mL (ref <10)
ALPHAHYDROXYTRIAZOLAM: NEGATIVE ng/mL (ref <50)
Alcohol Metabolites: NEGATIVE ng/mL (ref <500)
Alphahydroxyalprazolam: NEGATIVE ng/mL (ref <25)
Alphahydroxymidazolam: NEGATIVE ng/mL (ref <50)
Aminoclonazepam: NEGATIVE ng/mL (ref <25)
Amphetamines: NEGATIVE ng/mL (ref <500)
Benzodiazepines: POSITIVE ng/mL — AB (ref <100)
Buprenorphine, Urine: NEGATIVE ng/mL (ref <5)
COCAINE METABOLITE: NEGATIVE ng/mL (ref <150)
Creatinine: 32.9 mg/dL
HYDROXYETHYLFLURAZEPAM: NEGATIVE ng/mL (ref <50)
Lorazepam: 109 ng/mL — ABNORMAL HIGH (ref <50)
MARIJUANA METABOLITE: NEGATIVE ng/mL (ref <20)
MDMA: NEGATIVE ng/mL (ref <500)
NORDIAZEPAM: NEGATIVE ng/mL (ref <50)
OPIATES: NEGATIVE ng/mL (ref <100)
OXAZEPAM: NEGATIVE ng/mL (ref <50)
OXIDANT: NEGATIVE ug/mL (ref <200)
OXYCODONE: NEGATIVE ng/mL (ref <100)
PH: 5.86 (ref 4.5–9.0)
Temazepam: NEGATIVE ng/mL (ref <50)

## 2018-04-13 ENCOUNTER — Other Ambulatory Visit: Payer: Self-pay | Admitting: Family Medicine

## 2018-05-22 ENCOUNTER — Encounter: Payer: Self-pay | Admitting: Family Medicine

## 2018-05-22 ENCOUNTER — Other Ambulatory Visit: Payer: Self-pay | Admitting: Family Medicine

## 2018-05-22 DIAGNOSIS — F411 Generalized anxiety disorder: Secondary | ICD-10-CM

## 2018-05-22 MED ORDER — LORAZEPAM 0.5 MG PO TABS: 0.5000 mg | ORAL_TABLET | Freq: Three times a day (TID) | ORAL | 2 refills | Status: DC | PRN

## 2018-05-22 NOTE — Telephone Encounter (Signed)
I sent it in How is her mom ?

## 2018-05-22 NOTE — Telephone Encounter (Signed)
Requesting: lorazepam 0.5mg  every 8hr prn Contract: 2019 UDS: 04/05/18 Last OV: 04/05/18 Next Ov: 10/14/18 Last refill: 02/20/18, #90, 2 RF Database: no discrepancies found  Please advise.

## 2018-05-23 ENCOUNTER — Other Ambulatory Visit: Payer: BC Managed Care – PPO

## 2018-06-08 ENCOUNTER — Other Ambulatory Visit: Payer: Self-pay | Admitting: Family Medicine

## 2018-06-08 DIAGNOSIS — E785 Hyperlipidemia, unspecified: Secondary | ICD-10-CM

## 2018-06-11 ENCOUNTER — Encounter: Payer: Self-pay | Admitting: Family Medicine

## 2018-06-11 DIAGNOSIS — N898 Other specified noninflammatory disorders of vagina: Secondary | ICD-10-CM

## 2018-06-29 ENCOUNTER — Other Ambulatory Visit: Payer: Self-pay | Admitting: Family Medicine

## 2018-06-29 DIAGNOSIS — R32 Unspecified urinary incontinence: Secondary | ICD-10-CM

## 2018-07-03 ENCOUNTER — Ambulatory Visit
Admission: RE | Admit: 2018-07-03 | Discharge: 2018-07-03 | Disposition: A | Discharge status: Home / Self Care | Payer: BC Managed Care – PPO | Source: Ambulatory Visit | Attending: Family Medicine | Admitting: Family Medicine

## 2018-07-03 DIAGNOSIS — N63 Unspecified lump in unspecified breast: Secondary | ICD-10-CM

## 2018-08-21 ENCOUNTER — Telehealth: Payer: BC Managed Care – PPO | Admitting: Family

## 2018-08-21 ENCOUNTER — Other Ambulatory Visit: Payer: Self-pay | Admitting: Family Medicine

## 2018-08-21 ENCOUNTER — Encounter: Payer: Self-pay | Admitting: Family Medicine

## 2018-08-21 DIAGNOSIS — F411 Generalized anxiety disorder: Secondary | ICD-10-CM

## 2018-08-21 DIAGNOSIS — H109 Unspecified conjunctivitis: Secondary | ICD-10-CM

## 2018-08-21 MED ORDER — LORAZEPAM 0.5 MG PO TABS: 0.5000 mg | ORAL_TABLET | Freq: Three times a day (TID) | ORAL | 2 refills | Status: DC | PRN

## 2018-08-21 MED ORDER — POLYMYXIN B-TRIMETHOPRIM 10000-0.1 UNIT/ML-% OP SOLN: 1.0000 [drp] | Freq: Four times a day (QID) | OPHTHALMIC | 0 refills | Status: DC

## 2018-08-21 NOTE — Progress Notes (Signed)

## 2018-08-21 NOTE — Telephone Encounter (Signed)
Pt is requesting refill on lorazepam.   Last OV: 04/05/2018 Last Fill: 05/22/2018 #90 and 2RF UDS: 04/05/2018 Low risk

## 2018-08-21 NOTE — Telephone Encounter (Signed)
Refill sent.

## 2018-09-14 ENCOUNTER — Other Ambulatory Visit: Payer: Self-pay | Admitting: Family Medicine

## 2018-09-14 DIAGNOSIS — E785 Hyperlipidemia, unspecified: Secondary | ICD-10-CM

## 2018-10-05 ENCOUNTER — Other Ambulatory Visit: Payer: Self-pay | Admitting: Family Medicine

## 2018-10-05 DIAGNOSIS — R32 Unspecified urinary incontinence: Secondary | ICD-10-CM

## 2018-10-08 LAB — HM DEXA SCAN: HM Dexa Scan: NORMAL

## 2018-10-12 ENCOUNTER — Other Ambulatory Visit: Payer: Self-pay | Admitting: Family Medicine

## 2018-10-14 ENCOUNTER — Ambulatory Visit: Payer: BC Managed Care – PPO | Admitting: Family Medicine

## 2018-10-17 ENCOUNTER — Encounter: Payer: Self-pay | Admitting: Family Medicine

## 2018-10-17 ENCOUNTER — Encounter: Payer: Self-pay | Admitting: *Deleted

## 2018-10-17 ENCOUNTER — Ambulatory Visit: Payer: BC Managed Care – PPO | Admitting: Family Medicine

## 2018-10-17 VITALS — BP 136/78 | HR 60 | Temp 98.3°F | Resp 18 | Ht 68.0 in | Wt 358.0 lb

## 2018-10-17 DIAGNOSIS — Z6841 Body Mass Index (BMI) 40.0 and over, adult: Secondary | ICD-10-CM

## 2018-10-17 DIAGNOSIS — I10 Essential (primary) hypertension: Secondary | ICD-10-CM | POA: Diagnosis not present

## 2018-10-17 DIAGNOSIS — E785 Hyperlipidemia, unspecified: Secondary | ICD-10-CM | POA: Diagnosis not present

## 2018-10-17 DIAGNOSIS — F419 Anxiety disorder, unspecified: Secondary | ICD-10-CM

## 2018-10-17 LAB — COMPREHENSIVE METABOLIC PANEL
ALT: 18 U/L (ref 0–35)
AST: 14 U/L (ref 0–37)
Albumin: 4.4 g/dL (ref 3.5–5.2)
Alkaline Phosphatase: 60 U/L (ref 39–117)
BUN: 24 mg/dL — ABNORMAL HIGH (ref 6–23)
CO2: 30 mEq/L (ref 19–32)
Calcium: 9.2 mg/dL (ref 8.4–10.5)
Chloride: 102 mEq/L (ref 96–112)
Creatinine, Ser: 0.71 mg/dL (ref 0.40–1.20)
GFR: 89.75 mL/min (ref >60.00)
Glucose, Bld: 110 mg/dL — ABNORMAL HIGH (ref 70–99)
Potassium: 3.7 mEq/L (ref 3.5–5.1)
Sodium: 141 mEq/L (ref 135–145)
Total Bilirubin: 0.9 mg/dL (ref 0.2–1.2)
Total Protein: 6.8 g/dL (ref 6.0–8.3)

## 2018-10-17 LAB — LIPID PANEL
CHOL/HDL RATIO: 3
Cholesterol: 149 mg/dL (ref 0–200)
HDL: 53.7 mg/dL (ref >39.00)
LDL Cholesterol: 76 mg/dL (ref 0–99)
NonHDL: 95.53
TRIGLYCERIDES: 97 mg/dL (ref 0.0–149.0)
VLDL: 19.4 mg/dL (ref 0.0–40.0)

## 2018-10-17 NOTE — Assessment & Plan Note (Signed)
Encouraged heart healthy diet, increase exercise, avoid trans fats, consider a krill oil cap daily 

## 2018-10-17 NOTE — Assessment & Plan Note (Signed)
Stable con't meds 

## 2018-10-17 NOTE — Progress Notes (Signed)
Patient ID: Pam Phillips, female    DOB: 1960-12-20  Age: 58 y.o. MRN: [id number]    Subjective:  Subjective  HPI Pam Phillips presents for f/u chol and bp.   No complaints.     Review of Systems  Constitutional: Negative for appetite change, diaphoresis, fatigue and unexpected weight change.  Eyes: Negative for pain, redness and visual disturbance.  Respiratory: Negative for cough, chest tightness, shortness of breath and wheezing.   Cardiovascular: Negative for chest pain, palpitations and leg swelling.  Endocrine: Negative for cold intolerance, heat intolerance, polydipsia, polyphagia and polyuria.  Genitourinary: Negative for difficulty urinating, dysuria and frequency.  Neurological: Negative for dizziness, light-headedness, numbness and headaches.    History Past Medical History:  Diagnosis Date  . Ankle swelling 07-04-12   mostly left ankle -wears compression hose occ.  Marland Kitchen Anxiety   . Cancer ([place]) 07-04-12   dx. Melanoma in situ -back(mid-scapula area)  . Heart murmur   . Hyperlipidemia   . Hypertension     She has a past surgical history that includes Dental surgery (07-04-12); Melanoma excision (07/10/2012); Cholecystectomy (07-04-12); and Colonoscopy (N/A, 08/18/2013).   Her family history includes Alzheimer's disease in her mother; COPD in her father; Cancer (age of onset: 70) in her father; Colon cancer in her mother; Coronary artery disease (age of onset: 23) in her unknown relative; Heart disease (age of onset: 60) in her father; Hypertension in her father.She reports that she has never smoked. She has never used smokeless tobacco. She reports current alcohol use of about 1.0 standard drinks of alcohol per week. She reports that she does not use drugs.  Current Outpatient Medications on File Prior to Visit  Medication Sig Dispense Refill  . aspirin 81 MG EC tablet Take 81 mg by mouth every morning.     . fish oil-omega-3 fatty acids 1000 MG capsule Take 1 g by mouth 2  (two) times daily.     . Flaxseed, Linseed, (FLAX SEED OIL PO) Take by mouth.    Marland Kitchen GLUCOSAMINE-CHONDROITIN PO Take 1 capsule by mouth 2 (two) times daily.    Marland Kitchen LORazepam (ATIVAN) 0.5 MG tablet Take 1 tablet (0.5 mg total) by mouth every 8 (eight) hours as needed for anxiety. 90 tablet 2  . lovastatin (MEVACOR) 40 MG tablet Take 1 tablet (40 mg total) by mouth at bedtime. 90 tablet 1  . Multiple Vitamin (MULTIVITAMIN WITH MINERALS) TABS tablet Take 1 tablet by mouth every morning.    . nystatin (NYSTATIN) powder APPLY EXTERNALLY TO THE AFFECTED AREA THREE TIMES DAILY AS NEEDED 60 g 5  . olmesartan-hydrochlorothiazide (BENICAR HCT) 40-25 MG tablet TAKE 1 TABLET BY MOUTH DAILY 90 tablet 1  . polyvinyl alcohol (LIQUIFILM TEARS) 1.4 % ophthalmic solution Place 1 drop into both eyes daily as needed (For dry eyes.).    Marland Kitchen tolterodine (DETROL) 2 MG tablet TAKE 1 TABLET(2 MG) BY MOUTH TWICE DAILY 180 tablet 0  . trimethoprim-polymyxin b (POLYTRIM) ophthalmic solution Place 1 drop into the right eye every 6 (six) hours. 10 mL 0  . [DISCONTINUED] tolterodine (DETROL) 2 MG tablet Take 1 tablet (2 mg total) by mouth 2 (two) times daily. 60 tablet 2   No current facility-administered medications on file prior to visit.      Objective:  Objective  Physical Exam Vitals signs and nursing note reviewed.  Constitutional:      Appearance: She is well-developed.  HENT:     Head: Normocephalic and atraumatic.  Eyes:  Conjunctiva/sclera: Conjunctivae normal.  Neck:     Musculoskeletal: Normal range of motion and neck supple.     Thyroid: No thyromegaly.     Vascular: No carotid bruit or JVD.  Cardiovascular:     Rate and Rhythm: Normal rate and regular rhythm.     Heart sounds: Normal heart sounds. No murmur.  Pulmonary:     Effort: Pulmonary effort is normal. No respiratory distress.     Breath sounds: Normal breath sounds. No wheezing or rales.  Chest:     Chest wall: No tenderness.  Neurological:      Mental Status: She is alert and oriented to person, place, and time.    BP 136/78 (BP Location: Right Wrist, Cuff Size: Normal)   Pulse 60   Temp 98.3 F (36.8 C) (Oral)   Resp 18   Ht 5\' 8"  (1.727 m)   Wt (!) 358 lb (162.4 kg)   LMP 06/26/2012   SpO2 95%   BMI 54.43 kg/m  Wt Readings from Last 3 Encounters:  10/17/18 (!) 358 lb (162.4 kg)  04/05/18 (!) 345 lb 9.6 oz (156.8 kg)  10/01/17 (!) 349 lb (158.3 kg)     Lab Results  Component Value Date   WBC 7.0 04/05/2018   HGB 12.4 04/05/2018   HCT 36.5 04/05/2018   PLT 176.0 04/05/2018   GLUCOSE 110 (H) 10/17/2018   CHOL 149 10/17/2018   TRIG 97.0 10/17/2018   HDL 53.70 10/17/2018   LDLCALC 76 10/17/2018   ALT 18 10/17/2018   AST 14 10/17/2018   NA 141 10/17/2018   K 3.7 10/17/2018   CL 102 10/17/2018   CREATININE 0.71 10/17/2018   BUN 24 (H) 10/17/2018   CO2 30 10/17/2018   TSH 1.46 04/05/2018   HGBA1C 5.6 10/01/2017   MICROALBUR 2.8 (H) 07/29/2015    US Breast Ltd Uni Right Inc Axilla  Result Date: 07/03/2018 CLINICAL DATA:  Twelve month follow-up of a right breast mass EXAM: DIGITAL DIAGNOSTIC BILATERAL MAMMOGRAM WITH CAD AND TOMO ULTRASOUND RIGHT BREAST COMPARISON:  Previous exam(s). ACR Breast Density Category b: There are scattered areas of fibroglandular density. FINDINGS: The right breast mass, which is partially obscured mammographically, is unchanged. No other suspicious mammographic findings. Mammographic images were processed with CAD. On physical exam, no suspicious lumps are identified. Targeted ultrasound is performed, showing a cluster of cysts at 1 o'clock in the retroareolar region measuring 8 by 9 x 5 mm today versus 9 by 9 x 4 mm previously. IMPRESSION: No change in the probably benign right breast mass, favored to represent a cluster of cysts. No evidence of malignancy on the left. RECOMMENDATION: Recommend 12 month follow-up diagnostic mammogram and ultrasound to ensure stability of the probably  benign right breast mass. I have discussed the findings and recommendations with the patient. Results were also provided in writing at the conclusion of the visit. If applicable, a reminder letter will be sent to the patient regarding the next appointment. BI-RADS CATEGORY  3: Probably benign. Electronically Signed   By: Dorise Bullion III M.D   On: 07/03/2018 08:38   Mm Diag Breast Tomo Bilateral  Result Date: 07/03/2018 CLINICAL DATA:  Twelve month follow-up of a right breast mass EXAM: DIGITAL DIAGNOSTIC BILATERAL MAMMOGRAM WITH CAD AND TOMO ULTRASOUND RIGHT BREAST COMPARISON:  Previous exam(s). ACR Breast Density Category b: There are scattered areas of fibroglandular density. FINDINGS: The right breast mass, which is partially obscured mammographically, is unchanged. No other suspicious mammographic findings. Mammographic  images were processed with CAD. On physical exam, no suspicious lumps are identified. Targeted ultrasound is performed, showing a cluster of cysts at 1 o'clock in the retroareolar region measuring 8 by 9 x 5 mm today versus 9 by 9 x 4 mm previously. IMPRESSION: No change in the probably benign right breast mass, favored to represent a cluster of cysts. No evidence of malignancy on the left. RECOMMENDATION: Recommend 12 month follow-up diagnostic mammogram and ultrasound to ensure stability of the probably benign right breast mass. I have discussed the findings and recommendations with the patient. Results were also provided in writing at the conclusion of the visit. If applicable, a reminder letter will be sent to the patient regarding the next appointment. BI-RADS CATEGORY  3: Probably benign. Electronically Signed   By: Dorise Bullion III M.D   On: 07/03/2018 08:38     Assessment & Plan:  Plan  I am having Link Snuffer maintain her aspirin, fish oil-omega-3 fatty acids, polyvinyl alcohol, multivitamin with minerals, (Flaxseed, Linseed, (FLAX SEED OIL PO)),  GLUCOSAMINE-CHONDROITIN PO, nystatin, LORazepam, trimethoprim-polymyxin b, lovastatin, tolterodine, and olmesartan-hydrochlorothiazide.  No orders of the defined types were placed in this encounter.   Problem List Items Addressed This Visit      Unprioritized   Anxiety    Stable con't meds      Essential hypertension - Primary    Well controlled, no changes to meds. Encouraged heart healthy diet such as the DASH diet and exercise as tolerated.       Relevant Orders   Lipid panel (Completed)   Comprehensive metabolic panel (Completed)   Hyperlipidemia LDL goal <100    Encouraged heart healthy diet, increase exercise, avoid trans fats, consider a krill oil cap daily      Relevant Orders   Lipid panel (Completed)   Comprehensive metabolic panel (Completed)   Morbid obesity with BMI of 50.0-59.9, adult (HCC)    D/w diet and exercise Pt is swimming and walking           Follow-up: Return in about 6 months (around 04/17/2019) for annual exam, fasting.  [name], DO

## 2018-10-17 NOTE — Assessment & Plan Note (Signed)
Well controlled, no changes to meds. Encouraged heart healthy diet such as the DASH diet and exercise as tolerated.  °

## 2018-10-17 NOTE — Patient Instructions (Signed)
Cholesterol    Cholesterol is a fat. Your body needs a small amount of cholesterol. Cholesterol (plaque) may build up in your blood vessels (arteries). That makes you more likely to have a heart attack or stroke.  You cannot feel your cholesterol level. Having a blood test is the only way to find out if your level is high. Keep your test results. Work with your doctor to keep your cholesterol at a good level.  What do the results mean?  Total cholesterol is how much cholesterol is in your blood.  LDL is bad cholesterol. This is the type that can build up. Try to have low LDL.  HDL is good cholesterol. It cleans your blood vessels and carries LDL away. Try to have high HDL.  Triglycerides are fat that the body can store or burn for energy.  What are good levels of cholesterol?  Total cholesterol below 200.  LDL below 100 is good for people who have health risks. LDL below 70 is good for people who have very high risks.  HDL above 40 is good. It is best to have HDL of 60 or higher.  Triglycerides below 150.  How can I lower my cholesterol?  Diet    Follow your diet program as told by your doctor.  Choose fish, white meat chicken, or turkey that is roasted or baked. Try not to eat red meat, fried foods, sausage, or lunch meats.  Eat lots of fresh fruits and vegetables.  Choose whole grains, beans, pasta, potatoes, and cereals.  Choose olive oil, corn oil, or canola oil. Only use small amounts.  Try not to eat butter, mayonnaise, shortening, or palm kernel oils.  Try not to eat foods with trans fats.  Choose low-fat or nonfat dairy foods.  Drink skim or nonfat milk.  Eat low-fat or nonfat yogurt and cheeses.  Try not to drink whole milk or cream.  Try not to eat ice cream, egg yolks, or full-fat cheeses.  Healthy desserts include angel food cake, ginger snaps, animal crackers, hard candy, popsicles, and low-fat or nonfat frozen yogurt. Try not to eat pastries, cakes, pies, and cookies.     Exercise  Follow your  exercise program as told by your doctor.  Be more active. Try gardening, walking, and taking the stairs.  Ask your doctor about ways that you can be more active.  Medicine  Take over-the-counter and prescription medicines only as told by your doctor.  This information is not intended to replace advice given to you by your health care provider. Make sure you discuss any questions you have with your health care provider.  Document Released: 12/29/2008 Document Revised: 05/03/2016 Document Reviewed: 04/13/2016  Elsevier Interactive Patient Education  2019 [employer]

## 2018-10-17 NOTE — Assessment & Plan Note (Addendum)
D/w diet and exercise Pt is swimming and walking

## 2018-11-20 ENCOUNTER — Encounter: Payer: Self-pay | Admitting: Family Medicine

## 2018-11-20 DIAGNOSIS — F411 Generalized anxiety disorder: Secondary | ICD-10-CM

## 2018-11-22 ENCOUNTER — Encounter: Payer: Self-pay | Admitting: Family Medicine

## 2018-11-22 MED ORDER — LORAZEPAM 0.5 MG PO TABS: 0.5000 mg | ORAL_TABLET | Freq: Three times a day (TID) | ORAL | 0 refills | Status: DC | PRN

## 2018-11-22 NOTE — Telephone Encounter (Signed)
Last filled per database:08/21/18 Last written: 04/05/18 Last ov: 04/05/18 Next ov: 04/22/19 Contract: 03/18/19 UDS: 03/18/19

## 2018-11-22 NOTE — Telephone Encounter (Signed)
See other phone message  

## 2018-12-24 ENCOUNTER — Encounter: Payer: Self-pay | Admitting: Family Medicine

## 2018-12-24 ENCOUNTER — Other Ambulatory Visit: Payer: Self-pay | Admitting: Family Medicine

## 2018-12-24 DIAGNOSIS — F411 Generalized anxiety disorder: Secondary | ICD-10-CM

## 2018-12-24 MED ORDER — LORAZEPAM 0.5 MG PO TABS: 0.5000 mg | ORAL_TABLET | Freq: Three times a day (TID) | ORAL | 0 refills | Status: DC | PRN

## 2018-12-24 NOTE — Telephone Encounter (Signed)
Last written: 11/22/18 Last ov: 10/17/18 Next ov:  04/22/19 Contract: 04/06/19 UDS: 04/06/19

## 2019-01-13 ENCOUNTER — Other Ambulatory Visit: Payer: Self-pay | Admitting: Family Medicine

## 2019-01-13 DIAGNOSIS — R32 Unspecified urinary incontinence: Secondary | ICD-10-CM

## 2019-01-21 ENCOUNTER — Other Ambulatory Visit: Payer: Self-pay | Admitting: Family Medicine

## 2019-01-21 ENCOUNTER — Encounter: Payer: Self-pay | Admitting: Family Medicine

## 2019-01-21 DIAGNOSIS — B354 Tinea corporis: Secondary | ICD-10-CM

## 2019-01-21 MED ORDER — NYSTATIN 100000 UNIT/GM EX POWD: CUTANEOUS | 5 refills | Status: DC

## 2019-01-28 ENCOUNTER — Encounter: Payer: Self-pay | Admitting: Family Medicine

## 2019-01-28 ENCOUNTER — Other Ambulatory Visit: Payer: Self-pay | Admitting: Family Medicine

## 2019-01-28 DIAGNOSIS — F411 Generalized anxiety disorder: Secondary | ICD-10-CM

## 2019-01-28 MED ORDER — LORAZEPAM 0.5 MG PO TABS: 0.5000 mg | ORAL_TABLET | Freq: Three times a day (TID) | ORAL | 0 refills | Status: DC | PRN

## 2019-01-28 NOTE — Telephone Encounter (Signed)
Last written: 12/24/18 Last ov: 10/17/18 Next ov: 04/22/19 Contract: 04/06/19 UDS: 04/06/19

## 2019-01-28 NOTE — Telephone Encounter (Signed)
done

## 2019-02-27 ENCOUNTER — Encounter: Payer: Self-pay | Admitting: Family Medicine

## 2019-02-28 ENCOUNTER — Other Ambulatory Visit: Payer: Self-pay | Admitting: Family Medicine

## 2019-02-28 DIAGNOSIS — F411 Generalized anxiety disorder: Secondary | ICD-10-CM

## 2019-02-28 MED ORDER — LORAZEPAM 0.5 MG PO TABS: 0.5000 mg | ORAL_TABLET | Freq: Three times a day (TID) | ORAL | 0 refills | Status: DC | PRN

## 2019-03-20 ENCOUNTER — Encounter: Payer: Self-pay | Admitting: Family Medicine

## 2019-03-31 ENCOUNTER — Encounter: Payer: Self-pay | Admitting: Family Medicine

## 2019-03-31 DIAGNOSIS — F411 Generalized anxiety disorder: Secondary | ICD-10-CM

## 2019-04-01 ENCOUNTER — Other Ambulatory Visit: Payer: Self-pay | Admitting: Family Medicine

## 2019-04-01 DIAGNOSIS — F411 Generalized anxiety disorder: Secondary | ICD-10-CM

## 2019-04-01 MED ORDER — LORAZEPAM 0.5 MG PO TABS: 0.5000 mg | ORAL_TABLET | Freq: Three times a day (TID) | ORAL | 0 refills | Status: DC | PRN

## 2019-04-01 NOTE — Telephone Encounter (Signed)
Lorazepam refill.   Last OV: 10/17/2018 Last Fill: 02/28/2019 #90 and 0RF Pt sig: 1 tab q8h prn UDS: 04/05/2018 Low risk

## 2019-04-01 NOTE — Telephone Encounter (Signed)
Doing well her----- I have sent the prescription in

## 2019-04-12 ENCOUNTER — Other Ambulatory Visit: Payer: Self-pay | Admitting: Family Medicine

## 2019-04-12 DIAGNOSIS — E785 Hyperlipidemia, unspecified: Secondary | ICD-10-CM

## 2019-04-22 ENCOUNTER — Encounter: Payer: BC Managed Care – PPO | Admitting: Family Medicine

## 2019-05-01 ENCOUNTER — Other Ambulatory Visit: Payer: Self-pay

## 2019-05-01 ENCOUNTER — Encounter: Payer: Self-pay | Admitting: Family Medicine

## 2019-05-01 DIAGNOSIS — F411 Generalized anxiety disorder: Secondary | ICD-10-CM

## 2019-05-01 MED ORDER — LORAZEPAM 0.5 MG PO TABS: 0.5000 mg | ORAL_TABLET | Freq: Three times a day (TID) | ORAL | 0 refills | Status: DC | PRN

## 2019-05-01 NOTE — Telephone Encounter (Signed)
Requesting: Ativan Contract: 04/05/2018 UDS: 04/05/2018 Last OV: 10/17/2018 Next OV: 06/10/2019 Last Refill: 04/01/2019, #90--0 RF Database:   Message sent from [place].  Please advise

## 2019-05-18 ENCOUNTER — Telehealth: Payer: BC Managed Care – PPO | Admitting: Family

## 2019-05-18 DIAGNOSIS — L259 Unspecified contact dermatitis, unspecified cause: Secondary | ICD-10-CM | POA: Diagnosis not present

## 2019-05-18 MED ORDER — TRIAMCINOLONE ACETONIDE 0.1 % EX CREA: 1.0000 "application " | TOPICAL_CREAM | Freq: Two times a day (BID) | CUTANEOUS | 0 refills | Status: DC

## 2019-05-18 NOTE — Progress Notes (Signed)
E Visit for Rash  We are sorry that you are not feeling well. Here is how we plan to help!  Based on what you shared with me it looks like you have contact dermatitis.  Contact dermatitis is a skin rash caused by something that touches the skin and causes irritation or inflammation.  Your skin may be red, swollen, dry, cracked, and itch.  The rash should go away in a few days but can last a few weeks.  If you get a rash, it's important to figure out what caused it so the irritant can be avoided in the future.  I have prescribed a stronger steroid cream to relieve your symptoms. Triamcinolone twice daily.      HOME CARE:   Take cool showers and avoid direct sunlight.  Apply cool compress or wet dressings.  Take a bath in an oatmeal bath.  Sprinkle content of one Aveeno packet under running faucet with comfortably warm water.  Bathe for 15-20 minutes, 1-2 times daily.  Pat dry with a towel. Do not rub the rash.  Use hydrocortisone cream.  Take an antihistamine like Benadryl for widespread rashes that itch.  The adult dose of Benadryl is 25-50 mg by mouth 4 times daily.  Caution:  This type of medication may cause sleepiness.  Do not drink alcohol, drive, or operate dangerous machinery while taking antihistamines.  Do not take these medications if you have prostate enlargement.  Read package instructions thoroughly on all medications that you take.  GET HELP RIGHT AWAY IF:   Symptoms don't go away after treatment.  Severe itching that persists.  If you rash spreads or swells.  If you rash begins to smell.  If it blisters and opens or develops a yellow-brown crust.  You develop a fever.  You have a sore throat.  You become short of breath.  MAKE SURE YOU:  Understand these instructions. Will watch your condition. Will get help right away if you are not doing well or get worse.  Thank you for choosing an e-visit. Your e-visit answers were reviewed by a board certified  advanced clinical practitioner to complete your personal care plan. Depending upon the condition, your plan could have included both over the counter or prescription medications. Please review your pharmacy choice. Be sure that the pharmacy you have chosen is open so that you can pick up your prescription now.  If there is a problem you may message your provider in [place] to have the prescription routed to another pharmacy. Your safety is important to [place]. If you have drug allergies check your prescription carefully.  For the next 24 hours, you can use MyChart to ask questions about today's visit, request a non-urgent call back, or ask for a work or school excuse from your e-visit provider. You will get an email in the next two days asking about your experience. I hope that your e-visit has been valuable and will speed your recovery.    Greater than 5 minutes, yet less than 10 minutes of time have been spent researching, coordinating, and implementing care for this patient today.  Thank you for the details you included in the comment boxes. Those details are very helpful in determining the best course of treatment for you and help [place] to provide the best care.

## 2019-06-02 ENCOUNTER — Encounter: Payer: Self-pay | Admitting: Family Medicine

## 2019-06-02 DIAGNOSIS — F411 Generalized anxiety disorder: Secondary | ICD-10-CM

## 2019-06-02 MED ORDER — LORAZEPAM 0.5 MG PO TABS: 0.5000 mg | ORAL_TABLET | Freq: Three times a day (TID) | ORAL | 0 refills | Status: DC | PRN

## 2019-06-02 NOTE — Telephone Encounter (Signed)
Requesting: Ativan  Contract: 04/05/2018 UDS: 04/05/2018 Last OV: 10/17/2018 Next OV: 06/10/2019 Last Refill: 05/01/2019, #90--0 RF Database:   Please advise

## 2019-06-09 ENCOUNTER — Other Ambulatory Visit: Payer: Self-pay

## 2019-06-10 ENCOUNTER — Encounter: Payer: Self-pay | Admitting: Family Medicine

## 2019-06-10 ENCOUNTER — Ambulatory Visit (INDEPENDENT_AMBULATORY_CARE_PROVIDER_SITE_OTHER): Payer: BC Managed Care – PPO | Admitting: Family Medicine

## 2019-06-10 VITALS — BP 140/80 | HR 67 | Temp 97.9°F | Resp 18 | Ht 68.0 in | Wt 368.4 lb

## 2019-06-10 DIAGNOSIS — E785 Hyperlipidemia, unspecified: Secondary | ICD-10-CM

## 2019-06-10 DIAGNOSIS — D0359 Melanoma in situ of other part of trunk: Secondary | ICD-10-CM

## 2019-06-10 DIAGNOSIS — Z79899 Other long term (current) drug therapy: Secondary | ICD-10-CM

## 2019-06-10 DIAGNOSIS — I10 Essential (primary) hypertension: Secondary | ICD-10-CM

## 2019-06-10 DIAGNOSIS — Z23 Encounter for immunization: Secondary | ICD-10-CM

## 2019-06-10 DIAGNOSIS — Z0000 Encounter for general adult medical examination without abnormal findings: Secondary | ICD-10-CM | POA: Diagnosis not present

## 2019-06-10 NOTE — Patient Instructions (Addendum)
Preventive Care 40-59 Years Old, Female Preventive care refers to visits with your health care provider and lifestyle choices that can promote health and wellness. This includes:  A yearly physical exam. This may also be called an annual well check.  Regular dental visits and eye exams.  Immunizations.  Screening for certain conditions.  Healthy lifestyle choices, such as eating a healthy diet, getting regular exercise, not using drugs or products that contain nicotine and tobacco, and limiting alcohol use. What can I expect for my preventive care visit? Physical exam Your health care provider will check your:  Height and weight. This may be used to calculate body mass index (BMI), which tells if you are at a healthy weight.  Heart rate and blood pressure.  Skin for abnormal spots. Counseling Your health care provider may ask you questions about your:  Alcohol, tobacco, and drug use.  Emotional well-being.  Home and relationship well-being.  Sexual activity.  Eating habits.  Work and work environment.  Method of birth control.  Menstrual cycle.  Pregnancy history. What immunizations do I need?  Influenza (flu) vaccine  This is recommended every year. Tetanus, diphtheria, and pertussis (Tdap) vaccine  You may need a Td booster every 10 years. Varicella (chickenpox) vaccine  You may need this if you have not been vaccinated. Zoster (shingles) vaccine  You may need this after age 60. Measles, mumps, and rubella (MMR) vaccine  You may need at least one dose of MMR if you were born in 1957 or later. You may also need a second dose. Pneumococcal conjugate (PCV13) vaccine  You may need this if you have certain conditions and were not previously vaccinated. Pneumococcal polysaccharide (PPSV23) vaccine  You may need one or two doses if you smoke cigarettes or if you have certain conditions. Meningococcal conjugate (MenACWY) vaccine  You may need this if you  have certain conditions. Hepatitis A vaccine  You may need this if you have certain conditions or if you travel or work in places where you may be exposed to hepatitis A. Hepatitis B vaccine  You may need this if you have certain conditions or if you travel or work in places where you may be exposed to hepatitis B. Haemophilus influenzae type b (Hib) vaccine  You may need this if you have certain conditions. Human papillomavirus (HPV) vaccine  If recommended by your health care provider, you may need three doses over 6 months. You may receive vaccines as individual doses or as more than one vaccine together in one shot (combination vaccines). Talk with your health care provider about the risks and benefits of combination vaccines. What tests do I need? Blood tests  Lipid and cholesterol levels. These may be checked every 5 years, or more frequently if you are over 50 years old.  Hepatitis C test.  Hepatitis B test. Screening  Lung cancer screening. You may have this screening every year starting at age 55 if you have a 30-pack-year history of smoking and currently smoke or have quit within the past 15 years.  Colorectal cancer screening. All adults should have this screening starting at age 50 and continuing until age 75. Your health care provider may recommend screening at age 45 if you are at increased risk. You will have tests every 1-10 years, depending on your results and the type of screening test.  Diabetes screening. This is done by checking your blood sugar (glucose) after you have not eaten for a while (fasting). You may have this   done every 1-3 years.  Mammogram. This may be done every 1-2 years. Talk with your health care provider about when you should start having regular mammograms. This may depend on whether you have a family history of breast cancer.  BRCA-related cancer screening. This may be done if you have a family history of breast, ovarian, tubal, or peritoneal  cancers.  Pelvic exam and Pap test. This may be done every 3 years starting at age 21. Starting at age 30, this may be done every 5 years if you have a Pap test in combination with an HPV test. Other tests  Sexually transmitted disease (STD) testing.  Bone density scan. This is done to screen for osteoporosis. You may have this scan if you are at high risk for osteoporosis. Follow these instructions at home: Eating and drinking  Eat a diet that includes fresh fruits and vegetables, whole grains, lean protein, and low-fat dairy.  Take vitamin and mineral supplements as recommended by your health care provider.  Do not drink alcohol if: ? Your health care provider tells you not to drink. ? You are pregnant, may be pregnant, or are planning to become pregnant.  If you drink alcohol: ? Limit how much you have to 0-1 drink a day. ? Be aware of how much alcohol is in your drink. In the [place] one drink equals one 12 oz bottle of beer (355 mL), one 5 oz glass of wine (148 mL), or one 1 oz glass of hard liquor (44 mL). Lifestyle  Take daily care of your teeth and gums.  Stay active. Exercise for at least 30 minutes on 5 or more days each week.  Do not use any products that contain nicotine or tobacco, such as cigarettes, e-cigarettes, and chewing tobacco. If you need help quitting, ask your health care provider.  If you are sexually active, practice safe sex. Use a condom or other form of birth control (contraception) in order to prevent pregnancy and STIs (sexually transmitted infections).  If told by your health care provider, take low-dose aspirin daily starting at age 50. What's next?  Visit your health care provider once a year for a well check visit.  Ask your health care provider how often you should have your eyes and teeth checked.  Stay up to date on all vaccines. This information is not intended to replace advice given to you by your health care provider. Make sure you  discuss any questions you have with your health care provider. Document Released: 10/29/2015 Document Revised: 06/13/2018 Document Reviewed: 06/13/2018 Elsevier Patient Education  2020 [employer]  Preventive Care 40-59 Years Old, Female Preventive care refers to visits with your health care provider and lifestyle choices that can promote health and wellness. This includes:  A yearly physical exam. This may also be called an annual well check.  Regular dental visits and eye exams.  Immunizations.  Screening for certain conditions.  Healthy lifestyle choices, such as eating a healthy diet, getting regular exercise, not using drugs or products that contain nicotine and tobacco, and limiting alcohol use. What can I expect for my preventive care visit? Physical exam Your health care provider will check your:  Height and weight. This may be used to calculate body mass index (BMI), which tells if you are at a healthy weight.  Heart rate and blood pressure.  Skin for abnormal spots. Counseling Your health care provider may ask you questions about your:  Alcohol, tobacco, and drug use.  Emotional well-being.    Home and relationship well-being.  Sexual activity.  Eating habits.  Work and work Statistician.  Method of birth control.  Menstrual cycle.  Pregnancy history. What immunizations do I need?  Influenza (flu) vaccine  This is recommended every year. Tetanus, diphtheria, and pertussis (Tdap) vaccine  You may need a Td booster every 10 years. Varicella (chickenpox) vaccine  You may need this if you have not been vaccinated. Zoster (shingles) vaccine  You may need this after age 48. Measles, mumps, and rubella (MMR) vaccine  You may need at least one dose of MMR if you were born in 1957 or later. You may also need a second dose. Pneumococcal conjugate (PCV13) vaccine  You may need this if you have certain conditions and were not previously vaccinated.  Pneumococcal polysaccharide (PPSV23) vaccine  You may need one or two doses if you smoke cigarettes or if you have certain conditions. Meningococcal conjugate (MenACWY) vaccine  You may need this if you have certain conditions. Hepatitis A vaccine  You may need this if you have certain conditions or if you travel or work in places where you may be exposed to hepatitis A. Hepatitis B vaccine  You may need this if you have certain conditions or if you travel or work in places where you may be exposed to hepatitis B. Haemophilus influenzae type b (Hib) vaccine  You may need this if you have certain conditions. Human papillomavirus (HPV) vaccine  If recommended by your health care provider, you may need three doses over 6 months. You may receive vaccines as individual doses or as more than one vaccine together in one shot (combination vaccines). Talk with your health care provider about the risks and benefits of combination vaccines. What tests do I need? Blood tests  Lipid and cholesterol levels. These may be checked every 5 years, or more frequently if you are over 31 years old.  Hepatitis C test.  Hepatitis B test. Screening  Lung cancer screening. You may have this screening every year starting at age 24 if you have a 30-pack-year history of smoking and currently smoke or have quit within the past 15 years.  Colorectal cancer screening. All adults should have this screening starting at age 54 and continuing until age 1. Your health care provider may recommend screening at age 74 if you are at increased risk. You will have tests every 1-10 years, depending on your results and the type of screening test.  Diabetes screening. This is done by checking your blood sugar (glucose) after you have not eaten for a while (fasting). You may have this done every 1-3 years.  Mammogram. This may be done every 1-2 years. Talk with your health care provider about when you should start having  regular mammograms. This may depend on whether you have a family history of breast cancer.  BRCA-related cancer screening. This may be done if you have a family history of breast, ovarian, tubal, or peritoneal cancers.  Pelvic exam and Pap test. This may be done every 3 years starting at age 45. Starting at age 28, this may be done every 5 years if you have a Pap test in combination with an HPV test. Other tests  Sexually transmitted disease (STD) testing.  Bone density scan. This is done to screen for osteoporosis. You may have this scan if you are at high risk for osteoporosis. Follow these instructions at home: Eating and drinking  Eat a diet that includes fresh fruits and vegetables, whole grains, lean  protein, and low-fat dairy.  Take vitamin and mineral supplements as recommended by your health care provider.  Do not drink alcohol if: ? Your health care provider tells you not to drink. ? You are pregnant, may be pregnant, or are planning to become pregnant.  If you drink alcohol: ? Limit how much you have to 0-1 drink a day. ? Be aware of how much alcohol is in your drink. In the [place] one drink equals one 12 oz bottle of beer (355 mL), one 5 oz glass of wine (148 mL), or one 1 oz glass of hard liquor (44 mL). Lifestyle  Take daily care of your teeth and gums.  Stay active. Exercise for at least 30 minutes on 5 or more days each week.  Do not use any products that contain nicotine or tobacco, such as cigarettes, e-cigarettes, and chewing tobacco. If you need help quitting, ask your health care provider.  If you are sexually active, practice safe sex. Use a condom or other form of birth control (contraception) in order to prevent pregnancy and STIs (sexually transmitted infections).  If told by your health care provider, take low-dose aspirin daily starting at age 28. What's next?  Visit your health care provider once a year for a well check visit.  Ask your health care  provider how often you should have your eyes and teeth checked.  Stay up to date on all vaccines. This information is not intended to replace advice given to you by your health care provider. Make sure you discuss any questions you have with your health care provider. Document Released: 10/29/2015 Document Revised: 06/13/2018 Document Reviewed: 06/13/2018 Elsevier Patient Education  2020 [employer].

## 2019-06-10 NOTE — Progress Notes (Signed)
Subjective:     Pam Phillips is a 58 y.o. female and is here for a comprehensive physical exam. The patient reports no problems.  Social History   Socioeconomic History  . Marital status: Married    Spouse name: Not on file  . Number of children: Not on file  . Years of education: Not on file  . Highest education level: Not on file  Occupational History  . Occupation: Environmental health practitioner: [employer]  Social Needs  . Financial resource strain: Not on file  . Food insecurity    Worry: Not on file    Inability: Not on file  . Transportation needs    Medical: Not on file    Non-medical: Not on file  Tobacco Use  . Smoking status: Never Smoker  . Smokeless tobacco: Never Used  Substance and Sexual Activity  . Alcohol use: Yes    Alcohol/week: 1.0 standard drinks    Types: 1 Glasses of wine per week    Comment: r-occ. social 1 wine per week  . Drug use: No  . Sexual activity: Yes    Partners: Male  Lifestyle  . Physical activity    Days per week: Not on file    Minutes per session: Not on file  . Stress: Not on file  Relationships  . Social Herbalist on phone: Not on file    Gets together: Not on file    Attends religious service: Not on file    Active member of club or organization: Not on file    Attends meetings of clubs or organizations: Not on file    Relationship status: Not on file  . Intimate partner violence    Fear of current or ex partner: Not on file    Emotionally abused: Not on file    Physically abused: Not on file    Forced sexual activity: Not on file  Other Topics Concern  . Not on file  Social History Narrative   Exercise-- zumba 4-5 x a week in summer,  2-3 x during school    Health Maintenance  Topic Date Due  . COLONOSCOPY  08/18/2018  . INFLUENZA VACCINE  05/17/2019  . MAMMOGRAM  07/04/2019  . PAP SMEAR-Modifier  10/08/2021  . TETANUS/TDAP  01/07/2022  . DEXA SCAN   10/09/2023  . Hepatitis C Screening  Completed  . HIV Screening  Completed    The following portions of the patient's history were reviewed and updated as appropriate:  She  has a past medical history of Ankle swelling (07-04-12), Anxiety, Cancer ([place]) (07-04-12), Heart murmur, Hyperlipidemia, and Hypertension. She does not have any pertinent problems on file. She  has a past surgical history that includes Dental surgery (07-04-12); Melanoma excision (07/10/2012); Cholecystectomy (07-04-12); and Colonoscopy (N/A, 08/18/2013). Her family history includes Alzheimer's disease in her mother; COPD in her father; Cancer (age of onset: 48) in her father; Colon cancer in her mother; Coronary artery disease (age of onset: 54) in her unknown relative; Heart disease (age of onset: 61) in her father; Hypertension in her father. She  reports that she has never smoked. She has never used smokeless tobacco. She reports current alcohol use of about 1.0 standard drinks of alcohol per week. She reports that she does not use drugs. She has a current medication list which includes the following prescription(s): aspirin,  fish oil-omega-3 fatty acids, flaxseed (linseed), glucosamine-chondroitin, lorazepam, lovastatin, multivitamin with minerals, nystatin, olmesartan-hydrochlorothiazide, polyvinyl alcohol, tolterodine, trimethoprim-polymyxin b, valacyclovir, and triamcinolone cream. Current Outpatient Medications on File Prior to Visit  Medication Sig Dispense Refill  . aspirin 81 MG EC tablet Take 81 mg by mouth every morning.     . fish oil-omega-3 fatty acids 1000 MG capsule Take 1 g by mouth 2 (two) times daily.     . Flaxseed, Linseed, (FLAX SEED OIL PO) Take by mouth.    Marland Kitchen GLUCOSAMINE-CHONDROITIN PO Take 1 capsule by mouth 2 (two) times daily.    Marland Kitchen LORazepam (ATIVAN) 0.5 MG tablet Take 1 tablet (0.5 mg total) by mouth every 8 (eight) hours as needed for anxiety. 90 tablet 0  . lovastatin (MEVACOR) 40 MG tablet TAKE 1  TABLET(40 MG) BY MOUTH AT BEDTIME 90 tablet 1  . Multiple Vitamin (MULTIVITAMIN WITH MINERALS) TABS tablet Take 1 tablet by mouth every morning.    . nystatin (NYSTATIN) powder APPLY EXTERNALLY TO THE AFFECTED AREA THREE TIMES DAILY AS NEEDED 60 g 5  . olmesartan-hydrochlorothiazide (BENICAR HCT) 40-25 MG tablet TAKE 1 TABLET BY MOUTH DAILY 90 tablet 1  . polyvinyl alcohol (LIQUIFILM TEARS) 1.4 % ophthalmic solution Place 1 drop into both eyes daily as needed (For dry eyes.).    Marland Kitchen tolterodine (DETROL) 2 MG tablet TAKE 1 TABLET(2 MG) BY MOUTH TWICE DAILY 180 tablet 1  . trimethoprim-polymyxin b (POLYTRIM) ophthalmic solution Place 1 drop into the right eye every 6 (six) hours. 10 mL 0  . valACYclovir (VALTREX) 1000 MG tablet TK 2 TS NOW AND REPEAT IN 12 HOURS WITH A FULL GLASS OF WATER    . triamcinolone cream (KENALOG) 0.1 % Apply 1 application topically 2 (two) times daily. (Patient not taking: Reported on 06/10/2019) 30 g 0   No current facility-administered medications on file prior to visit.    She is allergic to other; codeine; and penicillins..  Review of Systems Review of Systems  Constitutional: Negative for activity change, appetite change and fatigue.  HENT: Negative for hearing loss, congestion, tinnitus and ear discharge.  dentist q34m Eyes: Negative for visual disturbance (see optho q1y -- vision corrected to 20/20 with glasses).  Respiratory: Negative for cough, chest tightness and shortness of breath.   Cardiovascular: Negative for chest pain, palpitations and leg swelling.  Gastrointestinal: Negative for abdominal pain, diarrhea, constipation and abdominal distention.  Genitourinary: Negative for urgency, frequency, decreased urine volume and difficulty urinating.  Musculoskeletal: Negative for back pain, arthralgias and gait problem.  Skin: Negative for color change, pallor and rash.  Neurological: Negative for dizziness, light-headedness, numbness and headaches.   Hematological: Negative for adenopathy. Does not bruise/bleed easily.  Psychiatric/Behavioral: Negative for suicidal ideas, confusion, sleep disturbance, self-injury, dysphoric mood, decreased concentration and agitation.       Objective:    BP 140/80 (BP Location: Left Arm, Patient Position: Sitting, Cuff Size: Large)   Pulse 67   Temp 97.9 F (36.6 C) (Temporal)   Resp 18   Ht 5\' 8"  (1.727 m)   Wt (!) 368 lb 6.4 oz (167.1 kg)   LMP 06/26/2012   SpO2 95%   BMI 56.02 kg/m  General appearance: alert, cooperative, appears stated age, no distress and morbidly obese Head: Normocephalic, without obvious abnormality, atraumatic Eyes: negative findings: lids and lashes normal, conjunctivae and sclerae normal and pupils equal, round, reactive to light and accomodation Ears: normal TM's and external ear canals both ears Nose: Nares normal. Septum midline.  Mucosa normal. No drainage or sinus tenderness. Throat: lips, mucosa, and tongue normal; teeth and gums normal Neck: no adenopathy, no carotid bruit, no JVD, supple, symmetrical, trachea midline and thyroid not enlarged, symmetric, no tenderness/mass/nodules Back: symmetric, no curvature. ROM normal. No CVA tenderness. Lungs: clear to auscultation bilaterally Breasts: gyn Heart: regular rate and rhythm, S1, S2 normal, no murmur, click, rub or gallop Abdomen: soft, non-tender; bowel sounds normal; no masses,  no organomegaly Pelvic: deferred--gyn Extremities: extremities normal, atraumatic, no cyanosis or edema Pulses: 2+ and symmetric Skin: Skin color, texture, turgor normal. No rashes or lesions Lymph nodes: Cervical, supraclavicular, and axillary nodes normal. Neurologic: Alert and oriented X 3, normal strength and tone. Normal symmetric reflexes. Normal coordination and gait    Assessment:    Healthy female exam.      Plan:    ghm utd Check labs  See After Visit Summary for Counseling Recommendations    1.  Hyperlipidemia, unspecified hyperlipidemia type Tolerating statin, encouraged heart healthy diet, avoid trans fats, minimize simple carbs and saturated fats. Increase exercise as tolerated - Lipid panel - Comprehensive metabolic panel  2. Essential hypertension Well controlled, no changes to meds. Encouraged heart healthy diet such as the DASH diet and exercise as tolerated.   - Lipid panel - Comprehensive metabolic panel  3. 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 Preventative health care SEE ABOVE  - CBC with Differential/Platelet - Lipid panel - TSH - Comprehensive metabolic panel - Flu Vaccine QUAD 6+ mos PF IM (Fluarix Quad PF)

## 2019-06-11 ENCOUNTER — Telehealth: Payer: Self-pay | Admitting: *Deleted

## 2019-06-11 LAB — COMPREHENSIVE METABOLIC PANEL
ALT: 18 U/L (ref 0–35)
AST: 17 U/L (ref 0–37)
Albumin: 4.4 g/dL (ref 3.5–5.2)
Alkaline Phosphatase: 57 U/L (ref 39–117)
BUN: 19 mg/dL (ref 6–23)
CO2: 30 mEq/L (ref 19–32)
Calcium: 9.5 mg/dL (ref 8.4–10.5)
Chloride: 101 mEq/L (ref 96–112)
Creatinine, Ser: 0.74 mg/dL (ref 0.40–1.20)
GFR: 80.33 mL/min (ref >60.00)
Glucose, Bld: 128 mg/dL — ABNORMAL HIGH (ref 70–99)
Potassium: 4.1 mEq/L (ref 3.5–5.1)
Sodium: 141 mEq/L (ref 135–145)
Total Bilirubin: 0.6 mg/dL (ref 0.2–1.2)
Total Protein: 6.8 g/dL (ref 6.0–8.3)

## 2019-06-11 LAB — CBC WITH DIFFERENTIAL/PLATELET
Basophils Absolute: 0.1 10*3/uL (ref 0.0–0.1)
Basophils Relative: 1.2 % (ref 0.0–3.0)
Eosinophils Absolute: 0.2 10*3/uL (ref 0.0–0.7)
Eosinophils Relative: 2.6 % (ref 0.0–5.0)
HCT: 38 % (ref 36.0–46.0)
Hemoglobin: 12.9 g/dL (ref 12.0–15.0)
Lymphocytes Relative: 30.2 % (ref 12.0–46.0)
Lymphs Abs: 2.5 10*3/uL (ref 0.7–4.0)
MCHC: 34 g/dL (ref 30.0–36.0)
MCV: 96.2 fl (ref 78.0–100.0)
Monocytes Absolute: 0.7 10*3/uL (ref 0.1–1.0)
Monocytes Relative: 8.5 % (ref 3.0–12.0)
Neutro Abs: 4.9 10*3/uL (ref 1.4–7.7)
Neutrophils Relative %: 57.5 % (ref 43.0–77.0)
Platelets: 171 10*3/uL (ref 150.0–400.0)
RBC: 3.95 Mil/uL (ref 3.87–5.11)
RDW: 12.6 % (ref 11.5–15.5)
WBC: 8.4 10*3/uL (ref 4.0–10.5)

## 2019-06-11 LAB — LIPID PANEL
Cholesterol: 163 mg/dL (ref 0–200)
HDL: 52.1 mg/dL (ref >39.00)
LDL Cholesterol: 92 mg/dL (ref 0–99)
NonHDL: 110.95
Total CHOL/HDL Ratio: 3
Triglycerides: 97 mg/dL (ref 0.0–149.0)
VLDL: 19.4 mg/dL (ref 0.0–40.0)

## 2019-06-11 LAB — TSH: TSH: 2.31 u[IU]/mL (ref 0.35–4.50)

## 2019-06-11 NOTE — Telephone Encounter (Signed)
Orders placed. Gilmore Laroche told verbally.

## 2019-06-11 NOTE — Addendum Note (Signed)
Addended by: Sanda Linger on: 06/11/2019 09:31 AM   Modules accepted: Orders

## 2019-06-11 NOTE — Telephone Encounter (Signed)
Pam Phillips -- pt saw Dr Carollee Herter yesterday and had a green UDS cup when she came into the lab. She left the urine sample in the lab but I did not see any orders in Epic. Can you place appropriate orders in yesterday's encounter and let me know when complete so I can release it?

## 2019-06-12 LAB — PAIN MGMT, PROFILE 8 W/CONF, U
6 Acetylmorphine: NEGATIVE ng/mL
Alcohol Metabolites: NEGATIVE ng/mL (ref <500)
Amphetamines: NEGATIVE ng/mL
Benzodiazepines: NEGATIVE ng/mL
Buprenorphine, Urine: NEGATIVE ng/mL
Cocaine Metabolite: NEGATIVE ng/mL
Creatinine: 72.9 mg/dL
MDMA: NEGATIVE ng/mL
Marijuana Metabolite: NEGATIVE ng/mL
Opiates: NEGATIVE ng/mL
Oxidant: NEGATIVE ug/mL
Oxycodone: NEGATIVE ng/mL
pH: 7.1 (ref 4.5–9.0)

## 2019-06-15 ENCOUNTER — Other Ambulatory Visit: Payer: Self-pay | Admitting: Family Medicine

## 2019-06-15 DIAGNOSIS — R739 Hyperglycemia, unspecified: Secondary | ICD-10-CM

## 2019-06-16 ENCOUNTER — Encounter (INDEPENDENT_AMBULATORY_CARE_PROVIDER_SITE_OTHER): Payer: BC Managed Care – PPO | Admitting: Ophthalmology

## 2019-07-01 ENCOUNTER — Other Ambulatory Visit: Payer: Self-pay

## 2019-07-01 ENCOUNTER — Other Ambulatory Visit (INDEPENDENT_AMBULATORY_CARE_PROVIDER_SITE_OTHER): Payer: BC Managed Care – PPO

## 2019-07-01 DIAGNOSIS — R739 Hyperglycemia, unspecified: Secondary | ICD-10-CM

## 2019-07-01 LAB — BASIC METABOLIC PANEL
BUN: 19 mg/dL (ref 6–23)
CO2: 28 mEq/L (ref 19–32)
Calcium: 8.9 mg/dL (ref 8.4–10.5)
Chloride: 104 mEq/L (ref 96–112)
Creatinine, Ser: 0.69 mg/dL (ref 0.40–1.20)
GFR: 87.07 mL/min (ref >60.00)
Glucose, Bld: 136 mg/dL — ABNORMAL HIGH (ref 70–99)
Potassium: 4 mEq/L (ref 3.5–5.1)
Sodium: 140 mEq/L (ref 135–145)

## 2019-07-01 LAB — HEMOGLOBIN A1C: Hgb A1c MFr Bld: 6 % (ref 4.6–6.5)

## 2019-07-02 ENCOUNTER — Other Ambulatory Visit: Payer: Self-pay

## 2019-07-02 ENCOUNTER — Encounter: Payer: Self-pay | Admitting: Family Medicine

## 2019-07-02 ENCOUNTER — Other Ambulatory Visit: Payer: Self-pay | Admitting: Family Medicine

## 2019-07-02 DIAGNOSIS — E1165 Type 2 diabetes mellitus with hyperglycemia: Secondary | ICD-10-CM

## 2019-07-02 MED ORDER — METFORMIN HCL 500 MG PO TABS: 500.0000 mg | ORAL_TABLET | Freq: Every day | ORAL | 2 refills | Status: DC

## 2019-07-04 ENCOUNTER — Other Ambulatory Visit: Payer: Self-pay

## 2019-07-04 ENCOUNTER — Encounter (INDEPENDENT_AMBULATORY_CARE_PROVIDER_SITE_OTHER): Payer: BC Managed Care – PPO | Admitting: Ophthalmology

## 2019-07-04 ENCOUNTER — Encounter: Payer: Self-pay | Admitting: Family Medicine

## 2019-07-04 DIAGNOSIS — I10 Essential (primary) hypertension: Secondary | ICD-10-CM | POA: Diagnosis not present

## 2019-07-04 DIAGNOSIS — H2513 Age-related nuclear cataract, bilateral: Secondary | ICD-10-CM

## 2019-07-04 DIAGNOSIS — H43813 Vitreous degeneration, bilateral: Secondary | ICD-10-CM | POA: Diagnosis not present

## 2019-07-04 DIAGNOSIS — D3132 Benign neoplasm of left choroid: Secondary | ICD-10-CM | POA: Diagnosis not present

## 2019-07-04 DIAGNOSIS — F411 Generalized anxiety disorder: Secondary | ICD-10-CM

## 2019-07-04 DIAGNOSIS — H35033 Hypertensive retinopathy, bilateral: Secondary | ICD-10-CM | POA: Diagnosis not present

## 2019-07-04 MED ORDER — LORAZEPAM 0.5 MG PO TABS: 0.5000 mg | ORAL_TABLET | Freq: Three times a day (TID) | ORAL | 0 refills | Status: DC | PRN

## 2019-07-04 NOTE — Telephone Encounter (Signed)
Requesting: Ativan Contract: 08/28/202 UDS: 06/13/19, low risk, next screening 06/12/20 Last OV: 06/10/2019 Next OV:  Last Refill: 06/02/2019, #90--0 RF Database:   Please advise

## 2019-07-25 ENCOUNTER — Encounter: Payer: Self-pay | Admitting: Family Medicine

## 2019-07-25 DIAGNOSIS — E1165 Type 2 diabetes mellitus with hyperglycemia: Secondary | ICD-10-CM

## 2019-07-25 MED ORDER — RYBELSUS 3 MG PO TABS: 1.0000 | ORAL_TABLET | Freq: Every day | ORAL | 0 refills | Status: DC

## 2019-07-25 NOTE — Telephone Encounter (Signed)
D/c metformin  Start rybelsus-  3 mg---  We have coupon card I believe --- this may help with weight loss too

## 2019-08-05 ENCOUNTER — Encounter: Payer: Self-pay | Admitting: Family Medicine

## 2019-08-05 ENCOUNTER — Other Ambulatory Visit: Payer: Self-pay | Admitting: *Deleted

## 2019-08-05 DIAGNOSIS — F411 Generalized anxiety disorder: Secondary | ICD-10-CM

## 2019-08-05 DIAGNOSIS — R32 Unspecified urinary incontinence: Secondary | ICD-10-CM

## 2019-08-05 MED ORDER — LORAZEPAM 0.5 MG PO TABS: 0.5000 mg | ORAL_TABLET | Freq: Three times a day (TID) | ORAL | 0 refills | Status: DC | PRN

## 2019-08-05 MED ORDER — TOLTERODINE TARTRATE 2 MG PO TABS: ORAL_TABLET | ORAL | 1 refills | Status: DC

## 2019-08-05 NOTE — Telephone Encounter (Signed)
Requesting: Ativan Contract: 06/13/2019 UDS: 06/13/2019, low risk, 06/12/20 Last OV: 06/10/2019 Next OV: 12/02/2019 Last Refill: 07/04/2019, #90--0 RF Database:   Please advise

## 2019-08-08 ENCOUNTER — Other Ambulatory Visit: Payer: Self-pay | Admitting: Family Medicine

## 2019-08-08 DIAGNOSIS — N6310 Unspecified lump in the right breast, unspecified quadrant: Secondary | ICD-10-CM

## 2019-08-11 ENCOUNTER — Telehealth: Payer: BC Managed Care – PPO | Admitting: Family

## 2019-08-11 DIAGNOSIS — H109 Unspecified conjunctivitis: Secondary | ICD-10-CM

## 2019-08-11 MED ORDER — POLYMYXIN B-TRIMETHOPRIM 10000-0.1 UNIT/ML-% OP SOLN: 1.0000 [drp] | Freq: Four times a day (QID) | OPHTHALMIC | 0 refills | Status: DC

## 2019-08-11 NOTE — Progress Notes (Signed)

## 2019-09-05 ENCOUNTER — Encounter: Payer: Self-pay | Admitting: Family Medicine

## 2019-09-05 DIAGNOSIS — F411 Generalized anxiety disorder: Secondary | ICD-10-CM

## 2019-09-05 MED ORDER — LORAZEPAM 0.5 MG PO TABS: 0.5000 mg | ORAL_TABLET | Freq: Three times a day (TID) | ORAL | 0 refills | Status: DC | PRN

## 2019-09-05 NOTE — Telephone Encounter (Signed)
Requesting:Lorazepam Contract:06/13/2019 UDS:06/10/2019 Last Visit:06/10/2019 Next Visit:12/02/2019 Last Refill:08/05/19  Please Advise

## 2019-10-06 ENCOUNTER — Encounter: Payer: Self-pay | Admitting: Family Medicine

## 2019-10-06 DIAGNOSIS — F411 Generalized anxiety disorder: Secondary | ICD-10-CM

## 2019-10-07 MED ORDER — LORAZEPAM 0.5 MG PO TABS: 0.5000 mg | ORAL_TABLET | Freq: Three times a day (TID) | ORAL | 0 refills | Status: DC | PRN

## 2019-10-07 NOTE — Telephone Encounter (Signed)
Please see mychart message   Requesting: Pam Phillips Contract: 06/13/19  UDS: 06/13/19, low risk, 06/12/20 Last OV: 06/10/2019 Next OV: 12/02/2019 Last Refill: 09/05/2019, #90--0 RF Database:   Please advise

## 2019-10-11 ENCOUNTER — Other Ambulatory Visit: Payer: Self-pay | Admitting: Family Medicine

## 2019-10-13 ENCOUNTER — Ambulatory Visit
Admission: RE | Admit: 2019-10-13 | Discharge: 2019-10-13 | Disposition: A | Discharge status: Home / Self Care | Payer: BC Managed Care – PPO | Source: Ambulatory Visit | Attending: Family Medicine | Admitting: Family Medicine

## 2019-10-13 ENCOUNTER — Other Ambulatory Visit: Payer: Self-pay

## 2019-10-13 DIAGNOSIS — N6310 Unspecified lump in the right breast, unspecified quadrant: Secondary | ICD-10-CM

## 2019-10-14 ENCOUNTER — Encounter: Payer: Self-pay | Admitting: Family Medicine

## 2019-10-16 ENCOUNTER — Encounter: Payer: Self-pay | Admitting: Family Medicine

## 2019-10-20 ENCOUNTER — Ambulatory Visit (INDEPENDENT_AMBULATORY_CARE_PROVIDER_SITE_OTHER): Payer: BC Managed Care – PPO | Admitting: Family Medicine

## 2019-10-20 ENCOUNTER — Other Ambulatory Visit: Payer: Self-pay

## 2019-10-20 ENCOUNTER — Encounter: Payer: Self-pay | Admitting: Family Medicine

## 2019-10-20 DIAGNOSIS — F418 Other specified anxiety disorders: Secondary | ICD-10-CM | POA: Diagnosis not present

## 2019-10-20 MED ORDER — FLUOXETINE HCL 20 MG PO TABS: 20.0000 mg | ORAL_TABLET | Freq: Every day | ORAL | 3 refills | Status: DC

## 2019-10-20 NOTE — Progress Notes (Signed)
Virtual Visit via Video Note  I connected with Pam Phillips on 10/20/19 at  8:00 AM EST by a video enabled telemedicine application and verified that I am speaking with the correct person using two identifiers.  Location: Patient: at work  Provider: office   I discussed the limitations of evaluation and management by telemedicine and the availability of in person appointments. The patient expressed understanding and agreed to proceed.  History of Present Illness: Pt is at work .  She is under a lot of stress   her mom is in a NH with dementia--- covid numbers are making her nervous She is crying all the time  Observations/Objective: No vitals obtained  Pt is in NAD She is teary but is not suicidal  Assessment and Plan: 1. Depression with anxiety F/u 1 month or sooner prn  - FLUoxetine (PROZAC) 20 MG tablet; Take 1 tablet (20 mg total) by mouth daily.  Dispense: 30 tablet; Refill: 3   Follow Up Instructions:    I discussed the assessment and treatment plan with the patient. The patient was provided an opportunity to ask questions and all were answered. The patient agreed with the plan and demonstrated an understanding of the instructions.   The patient was advised to call back or seek an in-person evaluation if the symptoms worsen or if the condition fails to improve as anticipated.  I provided 15 minutes of non-face-to-face time during this encounter.   [name], DO

## 2019-10-21 ENCOUNTER — Encounter: Payer: Self-pay | Admitting: Family Medicine

## 2019-10-22 ENCOUNTER — Other Ambulatory Visit: Payer: Self-pay

## 2019-10-22 ENCOUNTER — Other Ambulatory Visit (INDEPENDENT_AMBULATORY_CARE_PROVIDER_SITE_OTHER): Payer: BC Managed Care – PPO

## 2019-10-22 ENCOUNTER — Other Ambulatory Visit: Payer: Self-pay | Admitting: Family Medicine

## 2019-10-22 DIAGNOSIS — R739 Hyperglycemia, unspecified: Secondary | ICD-10-CM

## 2019-10-22 DIAGNOSIS — E785 Hyperlipidemia, unspecified: Secondary | ICD-10-CM

## 2019-10-22 DIAGNOSIS — E1165 Type 2 diabetes mellitus with hyperglycemia: Secondary | ICD-10-CM | POA: Diagnosis not present

## 2019-10-22 LAB — COMPREHENSIVE METABOLIC PANEL
ALT: 22 U/L (ref 0–35)
AST: 17 U/L (ref 0–37)
Albumin: 4.2 g/dL (ref 3.5–5.2)
Alkaline Phosphatase: 54 U/L (ref 39–117)
BUN: 21 mg/dL (ref 6–23)
CO2: 29 mEq/L (ref 19–32)
Calcium: 9.1 mg/dL (ref 8.4–10.5)
Chloride: 103 mEq/L (ref 96–112)
Creatinine, Ser: 0.65 mg/dL (ref 0.40–1.20)
GFR: 93.18 mL/min (ref >60.00)
Glucose, Bld: 110 mg/dL — ABNORMAL HIGH (ref 70–99)
Potassium: 4.2 mEq/L (ref 3.5–5.1)
Sodium: 139 mEq/L (ref 135–145)
Total Bilirubin: 0.9 mg/dL (ref 0.2–1.2)
Total Protein: 6.6 g/dL (ref 6.0–8.3)

## 2019-10-22 LAB — LIPID PANEL
Cholesterol: 151 mg/dL (ref 0–200)
HDL: 52.1 mg/dL (ref >39.00)
LDL Cholesterol: 74 mg/dL (ref 0–99)
NonHDL: 99
Total CHOL/HDL Ratio: 3
Triglycerides: 123 mg/dL (ref 0.0–149.0)
VLDL: 24.6 mg/dL (ref 0.0–40.0)

## 2019-10-22 LAB — HEMOGLOBIN A1C: Hgb A1c MFr Bld: 5.8 % (ref 4.6–6.5)

## 2019-10-24 ENCOUNTER — Encounter: Payer: Self-pay | Admitting: Family Medicine

## 2019-10-30 ENCOUNTER — Encounter: Payer: Self-pay | Admitting: Family Medicine

## 2019-10-30 DIAGNOSIS — E1165 Type 2 diabetes mellitus with hyperglycemia: Secondary | ICD-10-CM

## 2019-10-31 MED ORDER — METFORMIN HCL 500 MG PO TABS: 500.0000 mg | ORAL_TABLET | Freq: Every day | ORAL | 3 refills | Status: DC

## 2019-11-07 ENCOUNTER — Encounter: Payer: Self-pay | Admitting: Family Medicine

## 2019-11-07 DIAGNOSIS — F411 Generalized anxiety disorder: Secondary | ICD-10-CM

## 2019-11-07 MED ORDER — LORAZEPAM 0.5 MG PO TABS: 0.5000 mg | ORAL_TABLET | Freq: Three times a day (TID) | ORAL | 0 refills | Status: DC | PRN

## 2019-11-07 NOTE — Telephone Encounter (Signed)
Requesting: Ativan Contract: 06/13/2019 UDS: 06/13/2019 Last OV: 10/20/19 Next OV: 12/02/2019 Last Refill: 10/07/2019, #90--0 RF Database:   Please advise

## 2019-11-23 ENCOUNTER — Other Ambulatory Visit: Payer: Self-pay | Admitting: Family Medicine

## 2019-11-23 DIAGNOSIS — E785 Hyperlipidemia, unspecified: Secondary | ICD-10-CM

## 2019-12-01 ENCOUNTER — Other Ambulatory Visit: Payer: Self-pay

## 2019-12-02 ENCOUNTER — Other Ambulatory Visit: Payer: Self-pay

## 2019-12-02 ENCOUNTER — Encounter: Payer: Self-pay | Admitting: Family Medicine

## 2019-12-02 ENCOUNTER — Ambulatory Visit (INDEPENDENT_AMBULATORY_CARE_PROVIDER_SITE_OTHER): Payer: BC Managed Care – PPO | Admitting: Family Medicine

## 2019-12-02 VITALS — BP 138/88 | HR 57 | Temp 97.3°F | Resp 18 | Ht 68.0 in | Wt 367.6 lb

## 2019-12-02 DIAGNOSIS — E785 Hyperlipidemia, unspecified: Secondary | ICD-10-CM

## 2019-12-02 DIAGNOSIS — Z23 Encounter for immunization: Secondary | ICD-10-CM

## 2019-12-02 DIAGNOSIS — I10 Essential (primary) hypertension: Secondary | ICD-10-CM | POA: Diagnosis not present

## 2019-12-02 DIAGNOSIS — Z1211 Encounter for screening for malignant neoplasm of colon: Secondary | ICD-10-CM

## 2019-12-02 DIAGNOSIS — E119 Type 2 diabetes mellitus without complications: Secondary | ICD-10-CM | POA: Insufficient documentation

## 2019-12-02 DIAGNOSIS — F419 Anxiety disorder, unspecified: Secondary | ICD-10-CM

## 2019-12-02 DIAGNOSIS — E1165 Type 2 diabetes mellitus with hyperglycemia: Secondary | ICD-10-CM | POA: Insufficient documentation

## 2019-12-02 LAB — LIPID PANEL
Cholesterol: 153 mg/dL (ref 0–200)
HDL: 48.4 mg/dL (ref >39.00)
LDL Cholesterol: 88 mg/dL (ref 0–99)
NonHDL: 104.48
Total CHOL/HDL Ratio: 3
Triglycerides: 80 mg/dL (ref 0.0–149.0)
VLDL: 16 mg/dL (ref 0.0–40.0)

## 2019-12-02 LAB — COMPREHENSIVE METABOLIC PANEL
ALT: 21 U/L (ref 0–35)
AST: 15 U/L (ref 0–37)
Albumin: 4.2 g/dL (ref 3.5–5.2)
Alkaline Phosphatase: 55 U/L (ref 39–117)
BUN: 22 mg/dL (ref 6–23)
CO2: 28 mEq/L (ref 19–32)
Calcium: 9.3 mg/dL (ref 8.4–10.5)
Chloride: 101 mEq/L (ref 96–112)
Creatinine, Ser: 0.65 mg/dL (ref 0.40–1.20)
GFR: 93.14 mL/min (ref >60.00)
Glucose, Bld: 99 mg/dL (ref 70–99)
Potassium: 4 mEq/L (ref 3.5–5.1)
Sodium: 139 mEq/L (ref 135–145)
Total Bilirubin: 0.9 mg/dL (ref 0.2–1.2)
Total Protein: 6.5 g/dL (ref 6.0–8.3)

## 2019-12-02 LAB — MICROALBUMIN / CREATININE URINE RATIO
Creatinine,U: 170.6 mg/dL
Microalb Creat Ratio: 1.2 mg/g (ref 0.0–30.0)
Microalb, Ur: 2.1 mg/dL — ABNORMAL HIGH (ref 0.0–1.9)

## 2019-12-02 LAB — HEMOGLOBIN A1C: Hgb A1c MFr Bld: 6.1 % (ref 4.6–6.5)

## 2019-12-02 MED ORDER — BLOOD GLUCOSE MONITOR KIT: PACK | 0 refills | Status: DC

## 2019-12-02 NOTE — Assessment & Plan Note (Signed)
Encouraged heart healthy diet, increase exercise, avoid trans fats, consider a krill oil cap daily 

## 2019-12-02 NOTE — Patient Instructions (Addendum)
COVID-19 Vaccine Information can be found at: [email or web address].uk For questions related to vaccine distribution or appointments, please email [email or web address] or call [phone].      DASH Eating Plan DASH stands for "Dietary Approaches to Stop Hypertension." The DASH eating plan is a healthy eating plan that has been shown to reduce high blood pressure (hypertension). It may also reduce your risk for type 2 diabetes, heart disease, and stroke. The DASH eating plan may also help with weight loss. What are tips for following this plan?  General guidelines  Avoid eating more than 2,300 mg (milligrams) of salt (sodium) a day. If you have hypertension, you may need to reduce your sodium intake to 1,500 mg a day.  Limit alcohol intake to no more than 1 drink a day for nonpregnant women and 2 drinks a day for men. One drink equals 12 oz of beer, 5 oz of wine, or 1 oz of hard liquor.  Work with your health care provider to maintain a healthy body weight or to lose weight. Ask what an ideal weight is for you.  Get at least 30 minutes of exercise that causes your heart to beat faster (aerobic exercise) most days of the week. Activities may include walking, swimming, or biking.  Work with your health care provider or diet and nutrition specialist (dietitian) to adjust your eating plan to your individual calorie needs. Reading food labels   Check food labels for the amount of sodium per serving. Choose foods with less than 5 percent of the Daily Value of sodium. Generally, foods with less than 300 mg of sodium per serving fit into this eating plan.  To find whole grains, look for the word "whole" as the first word in the ingredient list. Shopping  Buy products labeled as "low-sodium" or "no salt added."  Buy fresh foods. Avoid canned foods and premade or frozen meals. Cooking  Avoid adding salt when cooking. Use salt-free  seasonings or herbs instead of table salt or sea salt. Check with your health care provider or pharmacist before using salt substitutes.  Do not fry foods. Cook foods using healthy methods such as baking, boiling, grilling, and broiling instead.  Cook with heart-healthy oils, such as olive, canola, soybean, or sunflower oil. Meal planning  Eat a balanced diet that includes: ? 5 or more servings of fruits and vegetables each day. At each meal, try to fill half of your plate with fruits and vegetables. ? Up to 6-8 servings of whole grains each day. ? Less than 6 oz of lean meat, poultry, or fish each day. A 3-oz serving of meat is about the same size as a deck of cards. One egg equals 1 oz. ? 2 servings of low-fat dairy each day. ? A serving of nuts, seeds, or beans 5 times each week. ? Heart-healthy fats. Healthy fats called Omega-3 fatty acids are found in foods such as flaxseeds and coldwater fish, like sardines, salmon, and mackerel.  Limit how much you eat of the following: ? Canned or prepackaged foods. ? Food that is high in trans fat, such as fried foods. ? Food that is high in saturated fat, such as fatty meat. ? Sweets, desserts, sugary drinks, and other foods with added sugar. ? Full-fat dairy products.  Do not salt foods before eating.  Try to eat at least 2 vegetarian meals each week.  Eat more home-cooked food and less restaurant, buffet, and fast food.  When eating at a restaurant, ask that your food be  prepared with less salt or no salt, if possible. What foods are recommended? The items listed may not be a complete list. Talk with your dietitian about what dietary choices are best for you. Grains Whole-grain or whole-wheat bread. Whole-grain or whole-wheat pasta. Brown rice. [place]. Bulgur. Whole-grain and low-sodium cereals. Pita bread. Low-fat, low-sodium crackers. Whole-wheat flour tortillas. Vegetables Fresh or frozen vegetables (raw, steamed, roasted, or  grilled). Low-sodium or reduced-sodium tomato and vegetable juice. Low-sodium or reduced-sodium tomato sauce and tomato paste. Low-sodium or reduced-sodium canned vegetables. Fruits All fresh, dried, or frozen fruit. Canned fruit in natural juice (without added sugar). Meat and other protein foods Skinless chicken or [place]. Ground chicken or [place]. Pork with fat trimmed off. Fish and seafood. Egg whites. Dried beans, peas, or lentils. Unsalted nuts, nut butters, and seeds. Unsalted canned beans. Lean cuts of beef with fat trimmed off. Low-sodium, lean deli meat. Dairy Low-fat (1%) or fat-free (skim) milk. Fat-free, low-fat, or reduced-fat cheeses. Nonfat, low-sodium ricotta or cottage cheese. Low-fat or nonfat yogurt. Low-fat, low-sodium cheese. Fats and oils Soft margarine without trans fats. Vegetable oil. Low-fat, reduced-fat, or light mayonnaise and salad dressings (reduced-sodium). Canola, safflower, olive, soybean, and sunflower oils. Avocado. Seasoning and other foods Herbs. Spices. Seasoning mixes without salt. Unsalted popcorn and pretzels. Fat-free sweets. What foods are not recommended? The items listed may not be a complete list. Talk with your dietitian about what dietary choices are best for you. Grains Baked goods made with fat, such as croissants, muffins, or some breads. Dry pasta or rice meal packs. Vegetables Creamed or fried vegetables. Vegetables in a cheese sauce. Regular canned vegetables (not low-sodium or reduced-sodium). Regular canned tomato sauce and paste (not low-sodium or reduced-sodium). Regular tomato and vegetable juice (not low-sodium or reduced-sodium). Angie Fava. Olives. Fruits Canned fruit in a light or heavy syrup. Fried fruit. Fruit in cream or butter sauce. Meat and other protein foods Fatty cuts of meat. Ribs. Fried meat. Berniece Salines. Sausage. Bologna and other processed lunch meats. Salami. Fatback. Hotdogs. Bratwurst. Salted nuts and seeds. Canned beans with  added salt. Canned or smoked fish. Whole eggs or egg yolks. Chicken or [place] with skin. Dairy Whole or 2% milk, cream, and half-and-half. Whole or full-fat cream cheese. Whole-fat or sweetened yogurt. Full-fat cheese. Nondairy creamers. Whipped toppings. Processed cheese and cheese spreads. Fats and oils Butter. Stick margarine. Lard. Shortening. Ghee. Bacon fat. Tropical oils, such as coconut, palm kernel, or palm oil. Seasoning and other foods Salted popcorn and pretzels. Onion salt, garlic salt, seasoned salt, table salt, and sea salt. Worcestershire sauce. Tartar sauce. Barbecue sauce. Teriyaki sauce. Soy sauce, including reduced-sodium. Steak sauce. Canned and packaged gravies. Fish sauce. Oyster sauce. Cocktail sauce. Horseradish that you find on the shelf. Ketchup. Mustard. Meat flavorings and tenderizers. Bouillon cubes. Hot sauce and Tabasco sauce. Premade or packaged marinades. Premade or packaged taco seasonings. Relishes. Regular salad dressings. Where to find more information:  National Heart, Lung, and [place]: https://wilson-eaton.com/  American Heart Association: www.heart.org Summary  The DASH eating plan is a healthy eating plan that has been shown to reduce high blood pressure (hypertension). It may also reduce your risk for type 2 diabetes, heart disease, and stroke.  With the DASH eating plan, you should limit salt (sodium) intake to 2,300 mg a day. If you have hypertension, you may need to reduce your sodium intake to 1,500 mg a day.  When on the DASH eating plan, aim to eat more fresh fruits and vegetables, whole grains,  lean proteins, low-fat dairy, and heart-healthy fats.  Work with your health care provider or diet and nutrition specialist (dietitian) to adjust your eating plan to your individual calorie needs. This information is not intended to replace advice given to you by your health care provider. Make sure you discuss any questions you have with your health care  provider. Document Revised: 09/14/2017 Document Reviewed: 09/25/2016 Elsevier Patient Education  2020 [employer].

## 2019-12-02 NOTE — Progress Notes (Signed)
Patient ID: Pam Phillips, female    DOB: October 03, 1961  Age: 59 y.o. MRN: [id number]    Subjective:  Subjective  HPI Pam Phillips presents for f/u dm , chol and lipid  HYPERTENSION   Blood pressure range-not checking   Chest pain- no      Dyspnea- no Lightheadedness- no   Edema- no  Other side effects - no   Medication compliance: good Low salt diet- yes    DIABETES    Blood Sugar ranges-not checking   Polyuria- no New Visual problems- no  Hypoglycemic symptoms- no  Other side effects-no Medication compliance - good Last eye exam- dec 2020 Foot exam- today   HYPERLIPIDEMIA  Medication compliance- good RUQ pain- no  Muscle aches- no Other side effects-no      Review of Systems  Constitutional: Negative for appetite change, diaphoresis, fatigue and unexpected weight change.  Eyes: Negative for pain, redness and visual disturbance.  Respiratory: Negative for cough, chest tightness, shortness of breath and wheezing.   Cardiovascular: Negative for chest pain, palpitations and leg swelling.  Endocrine: Negative for cold intolerance, heat intolerance, polydipsia, polyphagia and polyuria.  Genitourinary: Negative for difficulty urinating, dysuria and frequency.  Neurological: Negative for dizziness, light-headedness, numbness and headaches.    History Past Medical History:  Diagnosis Date  . Ankle swelling 07-04-12   mostly left ankle -wears compression hose occ.  Marland Kitchen Anxiety   . Cancer ([place]) 07-04-12   dx. Melanoma in situ -back(mid-scapula area)  . Heart murmur   . Hyperlipidemia   . Hypertension     She has a past surgical history that includes Dental surgery (07-04-12); Melanoma excision (07/10/2012); Cholecystectomy (07-04-12); and Colonoscopy (N/A, 08/18/2013).   Her family history includes Alzheimer's disease in her mother; COPD in her father; Cancer (age of onset: 7) in her father; Colon cancer in her mother; Coronary artery disease (age of onset:  65) in an other family member; Heart disease (age of onset: 52) in her father; Hypertension in her father.She reports that she has never smoked. She has never used smokeless tobacco. She reports current alcohol use of about 1.0 standard drinks of alcohol per week. She reports that she does not use drugs.  Current Outpatient Medications on File Prior to Visit  Medication Sig Dispense Refill  . aspirin 81 MG EC tablet Take 81 mg by mouth every morning.     . fish oil-omega-3 fatty acids 1000 MG capsule Take 1 g by mouth 2 (two) times daily.     . Flaxseed, Linseed, (FLAX SEED OIL PO) Take by mouth.    Marland Kitchen FLUoxetine (PROZAC) 20 MG tablet Take 1 tablet (20 mg total) by mouth daily. 30 tablet 3  . GLUCOSAMINE-CHONDROITIN PO Take 1 capsule by mouth 2 (two) times daily.    Marland Kitchen LORazepam (ATIVAN) 0.5 MG tablet Take 1 tablet (0.5 mg total) by mouth every 8 (eight) hours as needed for anxiety. 90 tablet 0  . lovastatin (MEVACOR) 40 MG tablet TAKE 1 TABLET(40 MG) BY MOUTH AT BEDTIME 90 tablet 1  . metFORMIN (GLUCOPHAGE) 500 MG tablet Take 1 tablet (500 mg total) by mouth daily with breakfast. 30 tablet 3  . Multiple Vitamin (MULTIVITAMIN WITH MINERALS) TABS tablet Take 1 tablet by mouth every morning.    . nystatin (NYSTATIN) powder APPLY EXTERNALLY TO THE AFFECTED AREA THREE TIMES DAILY AS NEEDED 60 g 5  . olmesartan-hydrochlorothiazide (BENICAR HCT) 40-25 MG tablet TAKE 1 TABLET BY MOUTH DAILY 90 tablet 0  .  polyvinyl alcohol (LIQUIFILM TEARS) 1.4 % ophthalmic solution Place 1 drop into both eyes daily as needed (For dry eyes.).    Marland Kitchen tolterodine (DETROL) 2 MG tablet TAKE 1 TABLET(2 MG) BY MOUTH TWICE DAILY 180 tablet 1  . triamcinolone cream (KENALOG) 0.1 % Apply 1 application topically 2 (two) times daily. 30 g 0  . trimethoprim-polymyxin b (POLYTRIM) ophthalmic solution Place 1 drop into the left eye every 6 (six) hours. 10 mL 0  . valACYclovir (VALTREX) 1000 MG tablet TK 2 TS NOW AND REPEAT IN 12 HOURS  WITH A FULL GLASS OF WATER    . Semaglutide (RYBELSUS) 3 MG TABS Take 1 tablet by mouth daily. (Patient not taking: Reported on 12/02/2019) 30 tablet 0   No current facility-administered medications on file prior to visit.     Objective:  Objective  Physical Exam Vitals and nursing note reviewed.  Constitutional:      Appearance: She is well-developed.  HENT:     Head: Normocephalic and atraumatic.  Eyes:     Conjunctiva/sclera: Conjunctivae normal.  Neck:     Thyroid: No thyromegaly.     Vascular: No carotid bruit or JVD.  Cardiovascular:     Rate and Rhythm: Normal rate and regular rhythm.     Heart sounds: Normal heart sounds. No murmur.  Pulmonary:     Effort: Pulmonary effort is normal. No respiratory distress.     Breath sounds: Normal breath sounds. No wheezing or rales.  Chest:     Chest wall: No tenderness.  Musculoskeletal:     Cervical back: Normal range of motion and neck supple.  Neurological:     Mental Status: She is alert and oriented to person, place, and time.    BP 138/88 (BP Location: Left Arm, Patient Position: Sitting, Cuff Size: Large)   Pulse (!) 57   Temp (!) 97.3 F (36.3 C) (Temporal)   Resp 18   Ht _0  (1.727 m)   Wt (!) 367 lb 9.6 oz (166.7 kg)   LMP 06/26/2012   SpO2 97%   BMI 55.89 kg/m  Wt Readings from Last 3 Encounters:  12/02/19 (!) 367 lb 9.6 oz (166.7 kg)  06/10/19 (!) 368 lb 6.4 oz (167.1 kg)  10/17/18 (!) 358 lb (162.4 kg)     Lab Results  Component Value Date   WBC 8.4 06/10/2019   HGB 12.9 06/10/2019   HCT 38.0 06/10/2019   PLT 171.0 06/10/2019   GLUCOSE 110 (H) 10/22/2019   CHOL 151 10/22/2019   TRIG 123.0 10/22/2019   HDL 52.10 10/22/2019   LDLCALC 74 10/22/2019   ALT 22 10/22/2019   AST 17 10/22/2019   NA 139 10/22/2019   K 4.2 10/22/2019   CL 103 10/22/2019   CREATININE 0.65 10/22/2019   BUN 21 10/22/2019   CO2 29 10/22/2019   TSH 2.31 06/10/2019   HGBA1C 5.8 10/22/2019   MICROALBUR 2.8 (H)  07/29/2015    US BREAST LTD UNI RIGHT INC AXILLA  Result Date: 10/13/2019 CLINICAL DATA:  Follow-up probably benign cluster of cysts in the 1 o'clock retroareolar right breast. EXAM: DIGITAL DIAGNOSTIC BILATERAL MAMMOGRAM WITH CAD AND TOMO ULTRASOUND RIGHT BREAST COMPARISON:  Previous exam(s). ACR Breast Density Category b: There are scattered areas of fibroglandular density. FINDINGS: The small, rounded mass seen in the retroareolar right breast initially on 04/08/2017 is no longer seen. No interval findings suspicious for malignancy in either breast. Mammographic images were processed with CAD. Targeted ultrasound is performed, showing  a multi-lobulated, predominantly hypoechoic mass with a small anechoic portion in the 1 o'clock retroareolar region of the right breast. This measures 10 x 9 x 6 mm in maximum dimensions. On the initial ultrasound on 05/15/2017, this measured 12 x 9 x 8 mm and had more anechoic components. No internal blood flow seen with power Doppler today. IMPRESSION: 1. Interval mild decrease in size of a cluster of cysts in the 1 o'clock retroareolar right breast. The decrease in size over a 2.5 year period of time is compatible with a benign process. Therefore, this does not need further follow-up. 2. No evidence of malignancy in either breast. RECOMMENDATION: Bilateral screening mammogram in 1 year. I have discussed the findings and recommendations with the patient. If applicable, a reminder letter will be sent to the patient regarding the next appointment. BI-RADS CATEGORY  2: Benign. Electronically Signed   By: Claudie Revering M.D.   On: 10/13/2019 10:26   MM DIAG BREAST TOMO BILATERAL  Result Date: 10/13/2019 CLINICAL DATA:  Follow-up probably benign cluster of cysts in the 1 o'clock retroareolar right breast. EXAM: DIGITAL DIAGNOSTIC BILATERAL MAMMOGRAM WITH CAD AND TOMO ULTRASOUND RIGHT BREAST COMPARISON:  Previous exam(s). ACR Breast Density Category b: There are scattered areas  of fibroglandular density. FINDINGS: The small, rounded mass seen in the retroareolar right breast initially on 04/08/2017 is no longer seen. No interval findings suspicious for malignancy in either breast. Mammographic images were processed with CAD. Targeted ultrasound is performed, showing a multi-lobulated, predominantly hypoechoic mass with a small anechoic portion in the 1 o'clock retroareolar region of the right breast. This measures 10 x 9 x 6 mm in maximum dimensions. On the initial ultrasound on 05/15/2017, this measured 12 x 9 x 8 mm and had more anechoic components. No internal blood flow seen with power Doppler today. IMPRESSION: 1. Interval mild decrease in size of a cluster of cysts in the 1 o'clock retroareolar right breast. The decrease in size over a 2.5 year period of time is compatible with a benign process. Therefore, this does not need further follow-up. 2. No evidence of malignancy in either breast. RECOMMENDATION: Bilateral screening mammogram in 1 year. I have discussed the findings and recommendations with the patient. If applicable, a reminder letter will be sent to the patient regarding the next appointment. BI-RADS CATEGORY  2: Benign. Electronically Signed   By: Claudie Revering M.D.   On: 10/13/2019 10:26     Assessment & Plan:  Plan  I am having Link Snuffer start on blood glucose meter kit and supplies. I am also having her maintain her aspirin, fish oil-omega-3 fatty acids, polyvinyl alcohol, multivitamin with minerals, (Flaxseed, Linseed, (FLAX SEED OIL PO)), GLUCOSAMINE-CHONDROITIN PO, nystatin, triamcinolone cream, valACYclovir, Rybelsus, tolterodine, trimethoprim-polymyxin b, olmesartan-hydrochlorothiazide, FLUoxetine, metFORMIN, LORazepam, and lovastatin.  Meds ordered this encounter  Medications  . blood glucose meter kit and supplies KIT    Sig: Dispense based on patient and insurance preference. Use up to four times daily as directed. (FOR ICD-9 250.00, 250.01).     Dispense:  1 each    Refill:  0    Order Specific Question:   Number of strips    Answer:   100    Order Specific Question:   Number of lancets    Answer:   100    Problem List Items Addressed This Visit      Unprioritized   Anxiety    Stable con't meds      Colon cancer screening  Relevant Orders   Ambulatory referral to Gastroenterology   Diabetes mellitus type II, uncontrolled ([place])    hgba1c to be checked , minimize simple carbs. Increase exercise as tolerated. Continue current meds      Relevant Medications   blood glucose meter kit and supplies KIT   Other Relevant Orders   Hemoglobin A1c   Pneumococcal polysaccharide vaccine 23-valent greater than or equal to 2yo subcutaneous/IM (Completed)   Microalbumin / creatinine urine ratio   Essential hypertension - Primary    Well controlled, no changes to meds. Encouraged heart healthy diet such as the DASH diet and exercise as tolerated.       Relevant Orders   Lipid panel   Comprehensive metabolic panel   Hyperlipidemia LDL goal <100    Encouraged heart healthy diet, increase exercise, avoid trans fats, consider a krill oil cap daily       Other Visit Diagnoses    Hyperlipidemia, unspecified hyperlipidemia type       Relevant Orders   Lipid panel   Comprehensive metabolic panel      Follow-up: Return in about 6 months (around 05/31/2020) for hypertension, hyperlipidemia, diabetes II.  [name], DO

## 2019-12-02 NOTE — Assessment & Plan Note (Signed)
Well controlled, no changes to meds. Encouraged heart healthy diet such as the DASH diet and exercise as tolerated.  °

## 2019-12-02 NOTE — Assessment & Plan Note (Signed)
Stable con't meds 

## 2019-12-02 NOTE — Assessment & Plan Note (Signed)
hgba1c to be checked, minimize simple carbs. Increase exercise as tolerated. Continue current meds  

## 2019-12-03 ENCOUNTER — Other Ambulatory Visit: Payer: Self-pay | Admitting: Family Medicine

## 2019-12-03 DIAGNOSIS — E1169 Type 2 diabetes mellitus with other specified complication: Secondary | ICD-10-CM

## 2019-12-03 DIAGNOSIS — I10 Essential (primary) hypertension: Secondary | ICD-10-CM

## 2019-12-03 DIAGNOSIS — E1165 Type 2 diabetes mellitus with hyperglycemia: Secondary | ICD-10-CM

## 2019-12-09 ENCOUNTER — Encounter: Payer: Self-pay | Admitting: Family Medicine

## 2019-12-09 DIAGNOSIS — F411 Generalized anxiety disorder: Secondary | ICD-10-CM

## 2019-12-09 MED ORDER — LORAZEPAM 0.5 MG PO TABS: 0.5000 mg | ORAL_TABLET | Freq: Three times a day (TID) | ORAL | 0 refills | Status: DC | PRN

## 2019-12-09 NOTE — Telephone Encounter (Signed)
Requesting:Lorazepam Contract:04/10/2018 UDS:06/10/2019 Last Visit:12/02/2019 Next Visit:06/01/2020 Last Refill: 11/07/2019  Please Advise

## 2020-01-10 ENCOUNTER — Other Ambulatory Visit: Payer: Self-pay | Admitting: Family Medicine

## 2020-01-13 ENCOUNTER — Other Ambulatory Visit: Payer: Self-pay | Admitting: Family Medicine

## 2020-01-13 ENCOUNTER — Encounter: Payer: Self-pay | Admitting: Family Medicine

## 2020-01-13 DIAGNOSIS — F411 Generalized anxiety disorder: Secondary | ICD-10-CM

## 2020-01-13 MED ORDER — LORAZEPAM 0.5 MG PO TABS: 0.5000 mg | ORAL_TABLET | Freq: Three times a day (TID) | ORAL | 0 refills | Status: DC | PRN

## 2020-01-13 NOTE — Telephone Encounter (Signed)
Lorazepam refill.   Last OV: 12/02/19 Last Fill: 12/09/2019 #90 and 0RF Pt sig: 1 tab q8h prn UDS: 06/10/2019 Low risk

## 2020-01-13 NOTE — Telephone Encounter (Signed)
meds refilled 

## 2020-02-18 ENCOUNTER — Encounter: Payer: Self-pay | Admitting: Family Medicine

## 2020-02-18 DIAGNOSIS — R32 Unspecified urinary incontinence: Secondary | ICD-10-CM

## 2020-02-18 DIAGNOSIS — F418 Other specified anxiety disorders: Secondary | ICD-10-CM

## 2020-02-18 MED ORDER — TOLTERODINE TARTRATE 2 MG PO TABS: 2.0000 mg | ORAL_TABLET | Freq: Two times a day (BID) | ORAL | 1 refills | Status: DC

## 2020-02-18 MED ORDER — FLUOXETINE HCL 20 MG PO TABS: 20.0000 mg | ORAL_TABLET | Freq: Every day | ORAL | 1 refills | Status: DC

## 2020-02-24 ENCOUNTER — Encounter: Payer: Self-pay | Admitting: Family Medicine

## 2020-02-24 DIAGNOSIS — F411 Generalized anxiety disorder: Secondary | ICD-10-CM

## 2020-02-24 MED ORDER — LORAZEPAM 0.5 MG PO TABS: 0.5000 mg | ORAL_TABLET | Freq: Three times a day (TID) | ORAL | 0 refills | Status: DC | PRN

## 2020-02-24 NOTE — Telephone Encounter (Signed)
Requesting: Ativan Contract: 06/13/19 UDS: 06/13/19, low risk Last OV: 12/02/19 Next OV: 06/01/20 Last Refill: 01/13/20, #90--0 RF Database:   Please advise

## 2020-03-27 ENCOUNTER — Other Ambulatory Visit: Payer: Self-pay | Admitting: Family Medicine

## 2020-03-27 DIAGNOSIS — B354 Tinea corporis: Secondary | ICD-10-CM

## 2020-03-30 ENCOUNTER — Other Ambulatory Visit: Payer: Self-pay

## 2020-03-30 DIAGNOSIS — E1165 Type 2 diabetes mellitus with hyperglycemia: Secondary | ICD-10-CM

## 2020-03-30 MED ORDER — METFORMIN HCL 500 MG PO TABS: 500.0000 mg | ORAL_TABLET | Freq: Every day | ORAL | 3 refills | Status: DC

## 2020-04-06 ENCOUNTER — Other Ambulatory Visit: Payer: Self-pay | Admitting: Family Medicine

## 2020-04-06 ENCOUNTER — Encounter: Payer: Self-pay | Admitting: Family Medicine

## 2020-04-06 DIAGNOSIS — F411 Generalized anxiety disorder: Secondary | ICD-10-CM

## 2020-04-06 MED ORDER — LORAZEPAM 0.5 MG PO TABS: 0.5000 mg | ORAL_TABLET | Freq: Three times a day (TID) | ORAL | 0 refills | Status: DC | PRN

## 2020-04-06 NOTE — Telephone Encounter (Signed)
Lorazepam refill.   Last OV: 12/02/2019  Last Fill: 02/24/2020 #90 and 0RF Pt sig: 1 tab q8h prn UDS: 06/10/2019 Low risk

## 2020-04-06 NOTE — Telephone Encounter (Signed)
done

## 2020-04-10 ENCOUNTER — Other Ambulatory Visit: Payer: Self-pay | Admitting: Family Medicine

## 2020-04-12 ENCOUNTER — Other Ambulatory Visit: Payer: Self-pay | Admitting: Family Medicine

## 2020-04-13 ENCOUNTER — Other Ambulatory Visit: Payer: Self-pay | Admitting: Family Medicine

## 2020-04-13 ENCOUNTER — Encounter: Payer: Self-pay | Admitting: Family Medicine

## 2020-04-13 MED ORDER — OLMESARTAN MEDOXOMIL-HCTZ 40-25 MG PO TABS: 1.0000 | ORAL_TABLET | Freq: Every day | ORAL | 0 refills | Status: DC

## 2020-05-19 ENCOUNTER — Other Ambulatory Visit: Payer: Self-pay | Admitting: Family Medicine

## 2020-05-19 ENCOUNTER — Encounter: Payer: Self-pay | Admitting: Family Medicine

## 2020-05-19 DIAGNOSIS — F411 Generalized anxiety disorder: Secondary | ICD-10-CM

## 2020-05-19 MED ORDER — LORAZEPAM 0.5 MG PO TABS: 0.5000 mg | ORAL_TABLET | Freq: Three times a day (TID) | ORAL | 0 refills | Status: DC | PRN

## 2020-05-19 NOTE — Telephone Encounter (Signed)
Refill sent.

## 2020-05-19 NOTE — Telephone Encounter (Signed)
Lorazepam refill.   Last OV: 12/02/2019 Last Fill: 04/06/2020 #90 and 0RF Pt sig: 1 tab q8h prn UDS: 06/10/2019 Low risk

## 2020-06-01 ENCOUNTER — Ambulatory Visit: Payer: BC Managed Care – PPO | Admitting: Family Medicine

## 2020-06-17 ENCOUNTER — Ambulatory Visit: Payer: BC Managed Care – PPO | Admitting: Family Medicine

## 2020-06-23 ENCOUNTER — Encounter: Payer: Self-pay | Admitting: Family Medicine

## 2020-06-23 DIAGNOSIS — F411 Generalized anxiety disorder: Secondary | ICD-10-CM

## 2020-06-24 ENCOUNTER — Other Ambulatory Visit: Payer: Self-pay | Admitting: Family Medicine

## 2020-06-24 DIAGNOSIS — F411 Generalized anxiety disorder: Secondary | ICD-10-CM

## 2020-06-24 MED ORDER — LORAZEPAM 0.5 MG PO TABS: 0.5000 mg | ORAL_TABLET | Freq: Three times a day (TID) | ORAL | 0 refills | Status: DC | PRN

## 2020-06-24 NOTE — Telephone Encounter (Signed)
Refill sent.

## 2020-06-24 NOTE — Telephone Encounter (Signed)
Lorazepam refill.   Last OV: 12/02/2019 Last Fill: 05/19/2020 #90 and 0RF Pt sig: 1 tab q8h prn UDS: 06/10/2019 Low risk

## 2020-06-26 ENCOUNTER — Other Ambulatory Visit: Payer: Self-pay | Admitting: Family Medicine

## 2020-06-26 DIAGNOSIS — E785 Hyperlipidemia, unspecified: Secondary | ICD-10-CM

## 2020-06-29 ENCOUNTER — Encounter: Payer: Self-pay | Admitting: Family Medicine

## 2020-06-29 ENCOUNTER — Other Ambulatory Visit: Payer: Self-pay

## 2020-06-29 ENCOUNTER — Ambulatory Visit: Payer: BC Managed Care – PPO | Admitting: Family Medicine

## 2020-06-29 VITALS — BP 126/86 | HR 57 | Temp 98.2°F | Resp 18 | Ht 68.0 in | Wt 363.0 lb

## 2020-06-29 DIAGNOSIS — F419 Anxiety disorder, unspecified: Secondary | ICD-10-CM

## 2020-06-29 DIAGNOSIS — E1169 Type 2 diabetes mellitus with other specified complication: Secondary | ICD-10-CM | POA: Diagnosis not present

## 2020-06-29 DIAGNOSIS — E1165 Type 2 diabetes mellitus with hyperglycemia: Secondary | ICD-10-CM | POA: Diagnosis not present

## 2020-06-29 DIAGNOSIS — I10 Essential (primary) hypertension: Secondary | ICD-10-CM

## 2020-06-29 DIAGNOSIS — Z23 Encounter for immunization: Secondary | ICD-10-CM

## 2020-06-29 DIAGNOSIS — E785 Hyperlipidemia, unspecified: Secondary | ICD-10-CM

## 2020-06-29 NOTE — Assessment & Plan Note (Signed)
Well controlled, no changes to meds. Encouraged heart healthy diet such as the DASH diet and exercise as tolerated.  °

## 2020-06-29 NOTE — Patient Instructions (Signed)
Carbohydrate Counting for Diabetes Mellitus, Adult  Carbohydrate counting is a method of keeping track of how many carbohydrates you eat. Eating carbohydrates naturally increases the amount of sugar (glucose) in the blood. Counting how many carbohydrates you eat helps keep your blood glucose within normal limits, which helps you manage your diabetes (diabetes mellitus). It is important to know how many carbohydrates you can safely have in each meal. This is different for every person. A diet and nutrition specialist (registered dietitian) can help you make a meal plan and calculate how many carbohydrates you should have at each meal and snack. Carbohydrates are found in the following foods:  Grains, such as breads and cereals.  Dried beans and soy products.  Starchy vegetables, such as potatoes, peas, and corn.  Fruit and fruit juices.  Milk and yogurt.  Sweets and snack foods, such as cake, cookies, candy, chips, and soft drinks. How do I count carbohydrates? There are two ways to count carbohydrates in food. You can use either of the methods or a combination of both. Reading "Nutrition Facts" on packaged food The "Nutrition Facts" list is included on the labels of almost all packaged foods and beverages in the [place] It includes:  The serving size.  Information about nutrients in each serving, including the grams (g) of carbohydrate per serving. To use the "Nutrition Facts":  Decide how many servings you will have.  Multiply the number of servings by the number of carbohydrates per serving.  The resulting number is the total amount of carbohydrates that you will be having. Learning standard serving sizes of other foods When you eat carbohydrate foods that are not packaged or do not include "Nutrition Facts" on the label, you need to measure the servings in order to count the amount of carbohydrates:  Measure the foods that you will eat with a food scale or measuring cup, if  needed.  Decide how many standard-size servings you will eat.  Multiply the number of servings by 15. Most carbohydrate-rich foods have about 15 g of carbohydrates per serving. ? For example, if you eat 8 oz (170 g) of strawberries, you will have eaten 2 servings and 30 g of carbohydrates (2 servings x 15 g = 30 g).  For foods that have more than one food mixed, such as soups and casseroles, you must count the carbohydrates in each food that is included. The following list contains standard serving sizes of common carbohydrate-rich foods. Each of these servings has about 15 g of carbohydrates:   hamburger bun or  English muffin.   oz (15 mL) syrup.   oz (14 g) jelly.  1 slice of bread.  1 six-inch tortilla.  3 oz (85 g) cooked rice or pasta.  4 oz (113 g) cooked dried beans.  4 oz (113 g) starchy vegetable, such as peas, corn, or potatoes.  4 oz (113 g) hot cereal.  4 oz (113 g) mashed potatoes or  of a large baked potato.  4 oz (113 g) canned or frozen fruit.  4 oz (120 mL) fruit juice.  4-6 crackers.  6 chicken nuggets.  6 oz (170 g) unsweetened dry cereal.  6 oz (170 g) plain fat-free yogurt or yogurt sweetened with artificial sweeteners.  8 oz (240 mL) milk.  8 oz (170 g) fresh fruit or one small piece of fruit.  24 oz (680 g) popped popcorn. Example of carbohydrate counting Sample meal  3 oz (85 g) chicken breast.  6 oz (170 g)   brown rice.  4 oz (113 g) corn.  8 oz (240 mL) milk.  8 oz (170 g) strawberries with sugar-free whipped topping. Carbohydrate calculation 1. Identify the foods that contain carbohydrates: ? Rice. ? Corn. ? Milk. ? Strawberries. 2. Calculate how many servings you have of each food: ? 2 servings rice. ? 1 serving corn. ? 1 serving milk. ? 1 serving strawberries. 3. Multiply each number of servings by 15 g: ? 2 servings rice x 15 g = 30 g. ? 1 serving corn x 15 g = 15 g. ? 1 serving milk x 15 g = 15 g. ? 1  serving strawberries x 15 g = 15 g. 4. Add together all of the amounts to find the total grams of carbohydrates eaten: ? 30 g + 15 g + 15 g + 15 g = 75 g of carbohydrates total. Summary  Carbohydrate counting is a method of keeping track of how many carbohydrates you eat.  Eating carbohydrates naturally increases the amount of sugar (glucose) in the blood.  Counting how many carbohydrates you eat helps keep your blood glucose within normal limits, which helps you manage your diabetes.  A diet and nutrition specialist (registered dietitian) can help you make a meal plan and calculate how many carbohydrates you should have at each meal and snack. This information is not intended to replace advice given to you by your health care provider. Make sure you discuss any questions you have with your health care provider. Document Revised: 04/26/2017 Document Reviewed: 03/15/2016 Elsevier Patient Education  2020 [employer]  

## 2020-06-29 NOTE — Assessment & Plan Note (Signed)
hgba1c to be checked, minimize simple carbs. Increase exercise as tolerated. Continue current meds  

## 2020-06-29 NOTE — Assessment & Plan Note (Signed)
Stable--- con't prozac   

## 2020-06-29 NOTE — Progress Notes (Signed)
Patient ID: Pam Phillips, female    DOB: [birth year]  Age: 59 y.o. MRN: [id number]    Subjective:  Subjective  HPI Pam Phillips presents for f/u dm, chol and bp   HYPERTENSION   Blood pressure range-not checking   Chest pain- no      Dyspnea- no Lightheadedness- no   Edema- no  Other side effects - no   Medication compliance: good Low salt diet- yes    DIABETES    Blood Sugar ranges-120-130  Polyuria- no New Visual problems- no  Hypoglycemic symptoms- no  Other side effects-no Medication compliance - good Last eye exam- dec 2020 Foot exam- today   HYPERLIPIDEMIA  Medication compliance- good RUQ pain- no  Muscle aches- no Other side effects-no      Review of Systems  Constitutional: Negative for appetite change, diaphoresis, fatigue and unexpected weight change.  Eyes: Negative for pain, redness and visual disturbance.  Respiratory: Negative for cough, chest tightness, shortness of breath and wheezing.   Cardiovascular: Negative for chest pain, palpitations and leg swelling.  Endocrine: Negative for cold intolerance, heat intolerance, polydipsia, polyphagia and polyuria.  Genitourinary: Negative for difficulty urinating, dysuria and frequency.  Neurological: Negative for dizziness, light-headedness, numbness and headaches.    History Past Medical History:  Diagnosis Date  . Ankle swelling 07-04-12   mostly left ankle -wears compression hose occ.  Marland Kitchen Anxiety   . Cancer ([place]) 07-04-12   dx. Melanoma in situ -back(mid-scapula area)  . Heart murmur   . Hyperlipidemia   . Hypertension     She has a past surgical history that includes Dental surgery (07-04-12); Melanoma excision (07/10/2012); Cholecystectomy (07-04-12); and Colonoscopy (N/A, 08/18/2013).   Her family history includes Alzheimer's disease in her mother; COPD in her father; Cancer (age of onset: 64) in her father; Colon cancer in her mother; Coronary artery disease (age of onset: 23) in an  other family member; Heart disease (age of onset: 68) in her father; Hypertension in her father.She reports that she has never smoked. She has never used smokeless tobacco. She reports current alcohol use of about 1.0 standard drink of alcohol per week. She reports that she does not use drugs.  Current Outpatient Medications on File Prior to Visit  Medication Sig Dispense Refill  . aspirin 81 MG EC tablet Take 81 mg by mouth every morning.     . blood glucose meter kit and supplies KIT Dispense based on patient and insurance preference. Use up to four times daily as directed. (FOR ICD-9 250.00, 250.01). 1 each 0  . fish oil-omega-3 fatty acids 1000 MG capsule Take 1 g by mouth 2 (two) times daily.     . Flaxseed, Linseed, (FLAX SEED OIL PO) Take by mouth.    Marland Kitchen FLUoxetine (PROZAC) 20 MG tablet Take 1 tablet (20 mg total) by mouth daily. 90 tablet 1  . GLUCOSAMINE-CHONDROITIN PO Take 1 capsule by mouth 2 (two) times daily.    Marland Kitchen LORazepam (ATIVAN) 0.5 MG tablet Take 1 tablet (0.5 mg total) by mouth every 8 (eight) hours as needed for anxiety. 90 tablet 0  . lovastatin (MEVACOR) 40 MG tablet TAKE 1 TABLET(40 MG) BY MOUTH AT BEDTIME 90 tablet 1  . metFORMIN (GLUCOPHAGE) 500 MG tablet Take 1 tablet (500 mg total) by mouth daily with breakfast. 30 tablet 3  . Multiple Vitamin (MULTIVITAMIN WITH MINERALS) TABS tablet Take 1 tablet by mouth every morning.    . nystatin (NYSTATIN) powder APPLY TO THE AFFECTED  AREA THREE TIMES DAILY AS NEEDED 60 g 5  . olmesartan-hydrochlorothiazide (BENICAR HCT) 40-25 MG tablet Take 1 tablet by mouth daily. 90 tablet 0  . polyvinyl alcohol (LIQUIFILM TEARS) 1.4 % ophthalmic solution Place 1 drop into both eyes daily as needed (For dry eyes.).    Marland Kitchen tolterodine (DETROL) 2 MG tablet Take 1 tablet (2 mg total) by mouth 2 (two) times daily. 180 tablet 1  . triamcinolone cream (KENALOG) 0.1 % Apply 1 application topically 2 (two) times daily. 30 g 0  . trimethoprim-polymyxin b  (POLYTRIM) ophthalmic solution Place 1 drop into the left eye every 6 (six) hours. 10 mL 0  . valACYclovir (VALTREX) 1000 MG tablet TK 2 TS NOW AND REPEAT IN 12 HOURS WITH A FULL GLASS OF WATER     No current facility-administered medications on file prior to visit.     Objective:  Objective  Physical Exam Vitals and nursing note reviewed.  Constitutional:      Appearance: She is well-developed.  HENT:     Head: Normocephalic and atraumatic.  Eyes:     Conjunctiva/sclera: Conjunctivae normal.  Neck:     Thyroid: No thyromegaly.     Vascular: No carotid bruit or JVD.  Cardiovascular:     Rate and Rhythm: Normal rate and regular rhythm.     Heart sounds: Normal heart sounds. No murmur heard.   Pulmonary:     Effort: Pulmonary effort is normal. No respiratory distress.     Breath sounds: Normal breath sounds. No wheezing or rales.  Chest:     Chest wall: No tenderness.  Musculoskeletal:     Cervical back: Normal range of motion and neck supple.  Neurological:     Mental Status: She is alert and oriented to person, place, and time.    Diabetic Foot Exam - Simple   Simple Foot Form Diabetic Foot exam was performed with the following findings: Yes 06/29/2020 10:11 AM  Visual Inspection No deformities, no ulcerations, no other skin breakdown bilaterally: Yes Sensation Testing Intact to touch and monofilament testing bilaterally: Yes Pulse Check Posterior Tibialis and Dorsalis pulse intact bilaterally: Yes Comments     BP 126/86 (BP Location: Right Arm, Patient Position: Sitting, Cuff Size: Large)   Pulse (!) 57   Temp 98.2 F (36.8 C) (Oral)   Resp 18   Ht _0  (1.727 m)   Wt (!) 363 lb (164.7 kg)   LMP 06/26/2012   SpO2 97%   BMI 55.19 kg/m  Wt Readings from Last 3 Encounters:  06/29/20 (!) 363 lb (164.7 kg)  12/02/19 (!) 367 lb 9.6 oz (166.7 kg)  06/10/19 (!) 368 lb 6.4 oz (167.1 kg)     Lab Results  Component Value Date   WBC 8.4 06/10/2019   HGB 12.9  06/10/2019   HCT 38.0 06/10/2019   PLT 171.0 06/10/2019   GLUCOSE 99 12/02/2019   CHOL 153 12/02/2019   TRIG 80.0 12/02/2019   HDL 48.40 12/02/2019   LDLCALC 88 12/02/2019   ALT 21 12/02/2019   AST 15 12/02/2019   NA 139 12/02/2019   K 4.0 12/02/2019   CL 101 12/02/2019   CREATININE 0.65 12/02/2019   BUN 22 12/02/2019   CO2 28 12/02/2019   TSH 2.31 06/10/2019   HGBA1C 6.1 12/02/2019   MICROALBUR 2.1 (H) 12/02/2019    US BREAST LTD UNI RIGHT INC AXILLA  Result Date: 10/13/2019 CLINICAL DATA:  Follow-up probably benign cluster of cysts in the 1 o'clock  retroareolar right breast. EXAM: DIGITAL DIAGNOSTIC BILATERAL MAMMOGRAM WITH CAD AND TOMO ULTRASOUND RIGHT BREAST COMPARISON:  Previous exam(s). ACR Breast Density Category b: There are scattered areas of fibroglandular density. FINDINGS: The small, rounded mass seen in the retroareolar right breast initially on 04/08/2017 is no longer seen. No interval findings suspicious for malignancy in either breast. Mammographic images were processed with CAD. Targeted ultrasound is performed, showing a multi-lobulated, predominantly hypoechoic mass with a small anechoic portion in the 1 o'clock retroareolar region of the right breast. This measures 10 x 9 x 6 mm in maximum dimensions. On the initial ultrasound on 05/15/2017, this measured 12 x 9 x 8 mm and had more anechoic components. No internal blood flow seen with power Doppler today. IMPRESSION: 1. Interval mild decrease in size of a cluster of cysts in the 1 o'clock retroareolar right breast. The decrease in size over a 2.5 year period of time is compatible with a benign process. Therefore, this does not need further follow-up. 2. No evidence of malignancy in either breast. RECOMMENDATION: Bilateral screening mammogram in 1 year. I have discussed the findings and recommendations with the patient. If applicable, a reminder letter will be sent to the patient regarding the next appointment. BI-RADS  CATEGORY  2: Benign. Electronically Signed   By: Claudie Revering M.D.   On: 10/13/2019 10:26   MM DIAG BREAST TOMO BILATERAL  Result Date: 10/13/2019 CLINICAL DATA:  Follow-up probably benign cluster of cysts in the 1 o'clock retroareolar right breast. EXAM: DIGITAL DIAGNOSTIC BILATERAL MAMMOGRAM WITH CAD AND TOMO ULTRASOUND RIGHT BREAST COMPARISON:  Previous exam(s). ACR Breast Density Category b: There are scattered areas of fibroglandular density. FINDINGS: The small, rounded mass seen in the retroareolar right breast initially on 04/08/2017 is no longer seen. No interval findings suspicious for malignancy in either breast. Mammographic images were processed with CAD. Targeted ultrasound is performed, showing a multi-lobulated, predominantly hypoechoic mass with a small anechoic portion in the 1 o'clock retroareolar region of the right breast. This measures 10 x 9 x 6 mm in maximum dimensions. On the initial ultrasound on 05/15/2017, this measured 12 x 9 x 8 mm and had more anechoic components. No internal blood flow seen with power Doppler today. IMPRESSION: 1. Interval mild decrease in size of a cluster of cysts in the 1 o'clock retroareolar right breast. The decrease in size over a 2.5 year period of time is compatible with a benign process. Therefore, this does not need further follow-up. 2. No evidence of malignancy in either breast. RECOMMENDATION: Bilateral screening mammogram in 1 year. I have discussed the findings and recommendations with the patient. If applicable, a reminder letter will be sent to the patient regarding the next appointment. BI-RADS CATEGORY  2: Benign. Electronically Signed   By: Claudie Revering M.D.   On: 10/13/2019 10:26     Assessment & Plan:  Plan  I am having Link Snuffer maintain her aspirin, fish oil-omega-3 fatty acids, polyvinyl alcohol, multivitamin with minerals, (Flaxseed, Linseed, (FLAX SEED OIL PO)), GLUCOSAMINE-CHONDROITIN PO, triamcinolone cream, valACYclovir,  trimethoprim-polymyxin b, blood glucose meter kit and supplies, tolterodine, FLUoxetine, nystatin, metFORMIN, olmesartan-hydrochlorothiazide, LORazepam, and lovastatin.  No orders of the defined types were placed in this encounter.   Problem List Items Addressed This Visit      Unprioritized   Anxiety    Stable con't prozac       Diabetes mellitus type II, uncontrolled ([place])    hgba1c to be checked , minimize simple carbs. Increase  exercise as tolerated. Continue current meds       Relevant Orders   Lipid panel   Hemoglobin A1c   Comprehensive metabolic panel   Microalbumin / creatinine urine ratio   Essential hypertension    Well controlled, no changes to meds. Encouraged heart healthy diet such as the DASH diet and exercise as tolerated.       Relevant Orders   Comprehensive metabolic panel   Microalbumin / creatinine urine ratio   Hyperlipidemia LDL goal <100    Tolerating statin, encouraged heart healthy diet, avoid trans fats, minimize simple carbs and saturated fats. Increase exercise as tolerated       Other Visit Diagnoses    Need for influenza vaccination    -  Primary   Relevant Orders   Flu Vaccine QUAD 36+ mos IM   Hyperlipidemia associated with type 2 diabetes mellitus ([place])       Relevant Orders   Lipid panel   Hemoglobin A1c      Follow-up: Return in about 6 months (around 12/27/2020), or if symptoms worsen or fail to improve, for annual exam, fasting.  [name], DO

## 2020-06-29 NOTE — Assessment & Plan Note (Signed)
Tolerating statin, encouraged heart healthy diet, avoid trans fats, minimize simple carbs and saturated fats. Increase exercise as tolerated 

## 2020-06-30 LAB — COMPREHENSIVE METABOLIC PANEL
AG Ratio: 1.6 (calc) (ref 1.0–2.5)
ALT: 18 U/L (ref 6–29)
AST: 15 U/L (ref 10–35)
Albumin: 4.2 g/dL (ref 3.6–5.1)
Alkaline phosphatase (APISO): 56 U/L (ref 37–153)
BUN: 20 mg/dL (ref 7–25)
CO2: 28 mmol/L (ref 20–32)
Calcium: 9.5 mg/dL (ref 8.6–10.4)
Chloride: 103 mmol/L (ref 98–110)
Creat: 0.63 mg/dL (ref 0.50–0.99)
Globulin: 2.7 g/dL (calc) (ref 1.9–3.7)
Glucose, Bld: 107 mg/dL — ABNORMAL HIGH (ref 65–99)
Potassium: 4.3 mmol/L (ref 3.5–5.3)
Sodium: 141 mmol/L (ref 135–146)
Total Bilirubin: 1 mg/dL (ref 0.2–1.2)
Total Protein: 6.9 g/dL (ref 6.1–8.1)

## 2020-06-30 LAB — MICROALBUMIN / CREATININE URINE RATIO
Creatinine, Urine: 118 mg/dL (ref 20–275)
Microalb Creat Ratio: 18 mcg/mg creat (ref <30)
Microalb, Ur: 2.1 mg/dL

## 2020-06-30 LAB — HEMOGLOBIN A1C
Hgb A1c MFr Bld: 5.5 % of total Hgb (ref <5.7)
Mean Plasma Glucose: 111 (calc)
eAG (mmol/L): 6.2 (calc)

## 2020-06-30 LAB — LIPID PANEL
Cholesterol: 160 mg/dL (ref <200)
HDL: 53 mg/dL (ref >=50)
LDL Cholesterol (Calc): 86 mg/dL (calc)
Non-HDL Cholesterol (Calc): 107 mg/dL (calc) (ref <130)
Total CHOL/HDL Ratio: 3 (calc) (ref <5.0)
Triglycerides: 109 mg/dL (ref <150)

## 2020-07-11 ENCOUNTER — Other Ambulatory Visit: Payer: Self-pay | Admitting: Family Medicine

## 2020-07-27 ENCOUNTER — Encounter: Payer: Self-pay | Admitting: Family Medicine

## 2020-07-27 NOTE — Telephone Encounter (Signed)
Last written: 06/24/20 Last ov: 06/29/20 Next ov: 12/29/19 Contract: 06/13/19 UDS: 06/13/19

## 2020-07-28 ENCOUNTER — Other Ambulatory Visit: Payer: Self-pay | Admitting: Family Medicine

## 2020-07-28 DIAGNOSIS — F411 Generalized anxiety disorder: Secondary | ICD-10-CM

## 2020-07-28 MED ORDER — LORAZEPAM 0.5 MG PO TABS: 0.5000 mg | ORAL_TABLET | Freq: Three times a day (TID) | ORAL | 0 refills | Status: DC | PRN

## 2020-07-28 NOTE — Telephone Encounter (Signed)
sent 

## 2020-08-15 ENCOUNTER — Other Ambulatory Visit: Payer: Self-pay | Admitting: Family Medicine

## 2020-08-15 DIAGNOSIS — F418 Other specified anxiety disorders: Secondary | ICD-10-CM

## 2020-08-19 ENCOUNTER — Other Ambulatory Visit: Payer: Self-pay | Admitting: Family Medicine

## 2020-08-19 DIAGNOSIS — R32 Unspecified urinary incontinence: Secondary | ICD-10-CM

## 2020-08-19 DIAGNOSIS — E1165 Type 2 diabetes mellitus with hyperglycemia: Secondary | ICD-10-CM

## 2020-08-30 ENCOUNTER — Encounter: Payer: Self-pay | Admitting: Family Medicine

## 2020-08-30 DIAGNOSIS — F411 Generalized anxiety disorder: Secondary | ICD-10-CM

## 2020-08-31 NOTE — Telephone Encounter (Signed)
Requesting:Lorazepam Contract:06/13/2019 UDS:06/10/2019 Last Visit:06/29/2020 Next Visit:none scheduled Last Refill: 07/28/2020  Please Advise

## 2020-09-01 MED ORDER — LORAZEPAM 0.5 MG PO TABS: 0.5000 mg | ORAL_TABLET | Freq: Three times a day (TID) | ORAL | 0 refills | Status: DC | PRN

## 2020-10-06 ENCOUNTER — Encounter: Payer: Self-pay | Admitting: Family Medicine

## 2020-10-06 ENCOUNTER — Other Ambulatory Visit: Payer: Self-pay | Admitting: Family Medicine

## 2020-10-06 DIAGNOSIS — F411 Generalized anxiety disorder: Secondary | ICD-10-CM

## 2020-10-06 MED ORDER — LORAZEPAM 0.5 MG PO TABS: 0.5000 mg | ORAL_TABLET | Freq: Three times a day (TID) | ORAL | 0 refills | Status: DC | PRN

## 2020-10-06 NOTE — Telephone Encounter (Signed)
Requesting: ativan  Contract: 05/2019 UDS:0/2020 Last Visit:06/2020 Next Visit:12/2020 Last Refill:09/01/2020  Please Advise

## 2020-10-06 NOTE — Telephone Encounter (Signed)
Requesting: Lorazepam  Contract: 06/13/19 UDS: 06/13/19, low risk Last OV: 06/29/20 Next OV: 12/28/2020 Last Refill: 08/22/2020, #90--0 RF Database:   Please advise

## 2020-10-16 ENCOUNTER — Other Ambulatory Visit: Payer: Self-pay | Admitting: Family Medicine

## 2020-10-22 LAB — HM DIABETES EYE EXAM

## 2020-10-25 DIAGNOSIS — S62202A Unspecified fracture of first metacarpal bone, left hand, initial encounter for closed fracture: Secondary | ICD-10-CM | POA: Insufficient documentation

## 2020-11-09 ENCOUNTER — Other Ambulatory Visit: Payer: Self-pay | Admitting: Family Medicine

## 2020-11-09 ENCOUNTER — Encounter: Payer: Self-pay | Admitting: Family Medicine

## 2020-11-09 DIAGNOSIS — F411 Generalized anxiety disorder: Secondary | ICD-10-CM

## 2020-11-09 MED ORDER — LORAZEPAM 0.5 MG PO TABS: 0.5000 mg | ORAL_TABLET | Freq: Three times a day (TID) | ORAL | 0 refills | Status: DC | PRN

## 2020-11-09 NOTE — Telephone Encounter (Signed)
done

## 2020-11-09 NOTE — Telephone Encounter (Signed)
Last RX: 10/06/20, #90 x no refills Last OV: 06/29/20 Next OV:  12/28/20 UDS:  06/10/19 CSC:  06/13/19 CSR:

## 2020-12-23 ENCOUNTER — Encounter: Payer: Self-pay | Admitting: Family Medicine

## 2020-12-23 DIAGNOSIS — F411 Generalized anxiety disorder: Secondary | ICD-10-CM

## 2020-12-23 MED ORDER — LORAZEPAM 0.5 MG PO TABS
0.5000 mg | ORAL_TABLET | Freq: Three times a day (TID) | ORAL | 0 refills | Status: DC | PRN
Start: 2020-12-23 — End: 2021-02-15

## 2020-12-23 NOTE — Telephone Encounter (Signed)
Requesting: Lorazepam Contract: 06/13/2019 UDS: 06/13/19, low risk Last OV: 06/29/2020 Next OV: 02/01/2021 Last Refill: 11/09/2020, #90--0 RF Database:   Please advise

## 2020-12-28 ENCOUNTER — Encounter: Payer: BC Managed Care – PPO | Admitting: Family Medicine

## 2021-01-15 ENCOUNTER — Other Ambulatory Visit: Payer: Self-pay | Admitting: Family Medicine

## 2021-01-22 ENCOUNTER — Other Ambulatory Visit: Payer: Self-pay | Admitting: Family Medicine

## 2021-01-22 DIAGNOSIS — E785 Hyperlipidemia, unspecified: Secondary | ICD-10-CM

## 2021-01-22 DIAGNOSIS — B354 Tinea corporis: Secondary | ICD-10-CM

## 2021-01-31 ENCOUNTER — Other Ambulatory Visit: Payer: Self-pay

## 2021-02-01 ENCOUNTER — Encounter: Payer: Self-pay | Admitting: Family Medicine

## 2021-02-01 ENCOUNTER — Other Ambulatory Visit: Payer: Self-pay

## 2021-02-01 ENCOUNTER — Ambulatory Visit (INDEPENDENT_AMBULATORY_CARE_PROVIDER_SITE_OTHER): Payer: BC Managed Care – PPO | Admitting: Family Medicine

## 2021-02-01 VITALS — BP 126/80 | HR 59 | Temp 98.6°F | Resp 18 | Ht 68.0 in | Wt 374.2 lb

## 2021-02-01 DIAGNOSIS — I10 Essential (primary) hypertension: Secondary | ICD-10-CM | POA: Diagnosis not present

## 2021-02-01 DIAGNOSIS — Z1211 Encounter for screening for malignant neoplasm of colon: Secondary | ICD-10-CM

## 2021-02-01 DIAGNOSIS — Z0000 Encounter for general adult medical examination without abnormal findings: Secondary | ICD-10-CM | POA: Diagnosis not present

## 2021-02-01 DIAGNOSIS — E785 Hyperlipidemia, unspecified: Secondary | ICD-10-CM

## 2021-02-01 DIAGNOSIS — E1165 Type 2 diabetes mellitus with hyperglycemia: Secondary | ICD-10-CM

## 2021-02-01 DIAGNOSIS — D0359 Melanoma in situ of other part of trunk: Secondary | ICD-10-CM

## 2021-02-01 DIAGNOSIS — R400 Somnolence: Secondary | ICD-10-CM

## 2021-02-01 DIAGNOSIS — E1169 Type 2 diabetes mellitus with other specified complication: Secondary | ICD-10-CM

## 2021-02-01 DIAGNOSIS — R0683 Snoring: Secondary | ICD-10-CM

## 2021-02-01 DIAGNOSIS — Z6841 Body Mass Index (BMI) 40.0 and over, adult: Secondary | ICD-10-CM

## 2021-02-01 NOTE — Progress Notes (Signed)
Subjective:   By signing my name below, I, Pam Phillips, attest that this documentation has been prepared under the direction and in the presence of  Dr. Roma Schanz, DO. 02/01/2021    Patient ID: Pam Phillips, female    DOB: 25-Mar-1961, 60 y.o.   MRN: [id number]  Chief Complaint  Patient presents with  . Annual Exam    Questions/concerns: no Eye exam: December 2021- Dr Pam Phillips Colonoscopy: none recently  Covid 4: will discuss Mammogram: need to schedule-gyn handles this A1C due -tlc,rma    HPI Patient is in today for a comprehensive physical exam. She reports her family member told her she snores while sleeping and she request for further evaluation for her sleep issues. She is also requesting a refill on 0.5 mg lorazepam PO daily for managing her anxiety. She is UTD on her Covid 19 vaccinations. She reports that she is doing ok at this time. She denies any cough, fever, shortness of breath, congestion, sore throat, chest pain, tightness, nausea, vomiting, diarrhea, dysuria, and headache at this time.   She has started a new job at [employer]. She last seen an eye doctor in December. She does not participate in formal exercise.   She is also f/u dm, chol and bp.  No new complaints  Past Medical History:  Diagnosis Date  . Ankle swelling 07-04-12   mostly left ankle -wears compression hose occ.  Marland Kitchen Anxiety   . Cancer ([place]) 07-04-12   dx. Melanoma in situ -back(mid-scapula area)  . Heart murmur   . Hyperlipidemia   . Hypertension     Past Surgical History:  Procedure Laterality Date  . CHOLECYSTECTOMY  07-04-12   '03-Lap. due to gallstones-[place]  . COLONOSCOPY N/A 08/18/2013   Procedure: COLONOSCOPY;  Surgeon: Pam Bears, MD;  Location: [facility];  Service: Gastroenterology;  Laterality: N/A;  . DENTAL SURGERY  07-04-12   2 wisdom teeth and 2 other teeth  . MELANOMA EXCISION  07/10/2012   Procedure: MELANOMA EXCISION;  Surgeon: Pam Klein, MD;   Location: [facility];  Service: General;  Laterality: N/A;  excision of back melanoma insitu    Family History  Problem Relation Age of Onset  . Hypertension Father   . COPD Father   . Heart disease Father 2       MI  . Cancer Father 43       prostate ca  . Colon cancer Mother   . Alzheimer's disease Mother   . Coronary artery disease Other 35       1st degree female  . Pancreatic cancer Neg Hx   . Stomach cancer Neg Hx     Social History   Socioeconomic History  . Marital status: Married    Spouse name: Not on file  . Number of children: Not on file  . Years of education: Not on file  . Highest education level: Not on file  Occupational History  . Occupation: Environmental health practitioner: [employer]  Tobacco Use  . Smoking status: Never Smoker  . Smokeless tobacco: Never Used  Substance and Sexual Activity  . Alcohol use: Yes    Alcohol/week: 1.0 standard drink    Types: 1 Glasses of wine per week    Comment: r-occ. social 1 wine per week  . Drug use: No  . Sexual activity: Yes    Partners: Male  Other Topics Concern  . Not on file  Social History Narrative  Exercise-- not right now   Social Determinants of Health   Financial Resource Strain: Not on file  Food Insecurity: Not on file  Transportation Needs: Not on file  Physical Activity: Not on file  Stress: Not on file  Social Connections: Not on file  Intimate Partner Violence: Not on file    Outpatient Medications Prior to Visit  Medication Sig Dispense Refill  . ACCU-CHEK GUIDE test strip TEST UP TO FOUR TIMES DAILY 100 strip 1  . aspirin 81 MG EC tablet Take 81 mg by mouth every morning.    . blood glucose meter kit and supplies KIT Dispense based on patient and insurance preference. Use up to four times daily as directed. (FOR ICD-9 250.00, 250.01). 1 each 0  . fish oil-omega-3 fatty acids 1000 MG capsule Take 1 g by mouth 2 (two) times daily.     . Flaxseed, Linseed, (FLAX SEED OIL PO) Take  by mouth.    . Fluoxetine HCl, PMDD, 20 MG TABS Take by mouth.    Marland Kitchen GLUCOSAMINE-CHONDROITIN PO Take 1 capsule by mouth 2 (two) times daily.    Marland Kitchen LORazepam (ATIVAN) 0.5 MG tablet Take 1 tablet (0.5 mg total) by mouth every 8 (eight) hours as needed for anxiety. 90 tablet 0  . lovastatin (MEVACOR) 40 MG tablet Take 1 tablet (40 mg total) by mouth at bedtime. 90 tablet 1  . metFORMIN (GLUCOPHAGE) 500 MG tablet Take 1 tablet (500 mg total) by mouth daily with breakfast. 90 tablet 1  . Multiple Vitamin (MULTIVITAMIN) capsule Take 1 capsule by mouth daily.    Marland Kitchen nystatin (NYSTATIN) powder Apply topically 3 (three) times daily as needed. 60 g 5  . olmesartan-hydrochlorothiazide (BENICAR HCT) 40-25 MG tablet Take 1 tablet by mouth daily. 90 tablet 1  . polyvinyl alcohol (LIQUIFILM TEARS) 1.4 % ophthalmic solution Place 1 drop into both eyes daily as needed (For dry eyes.).    Marland Kitchen tolterodine (DETROL) 2 MG tablet Take 1 tablet (2 mg total) by mouth 2 (two) times daily. 180 tablet 3  . trimethoprim-polymyxin b (POLYTRIM) ophthalmic solution Place 1 drop into the left eye every 6 (six) hours. 10 mL 0  . FLUoxetine (PROZAC) 20 MG tablet TAKE 1 TABLET(20 MG) BY MOUTH DAILY 90 tablet 1  . Multiple Vitamin (MULTIVITAMIN WITH MINERALS) TABS tablet Take 1 tablet by mouth every morning.    . triamcinolone cream (KENALOG) 0.1 % Apply 1 application topically 2 (two) times daily. 30 g 0  . valACYclovir (VALTREX) 1000 MG tablet TK 2 TS NOW AND REPEAT IN 12 HOURS WITH A FULL GLASS OF WATER     No facility-administered medications prior to visit.    Allergies  Allergen Reactions  . Other Swelling    Peaches(fresh) causes throat itching-swelling  . Codeine     Nausea & vomiting  . Penicillins     angioedema    Review of Systems  Constitutional: Negative for chills and fever.  HENT: Negative for congestion, ear pain, sinus pain and sore throat.   Eyes: Negative for pain.  Respiratory: Negative for cough,  shortness of breath and wheezing.   Cardiovascular: Negative for chest pain and palpitations.  Gastrointestinal: Negative for abdominal pain, diarrhea, nausea and vomiting.  Genitourinary: Negative for dysuria.  Neurological: Negative for dizziness and headaches.       Objective:    Physical Exam Constitutional:      Appearance: Normal appearance. She is obese.  HENT:     Head: Normocephalic and  atraumatic.     Right Ear: External ear normal.     Left Ear: External ear normal.  Eyes:     Extraocular Movements: Extraocular movements intact.     Pupils: Pupils are equal, round, and reactive to light.  Cardiovascular:     Rate and Rhythm: Normal rate and regular rhythm.     Pulses: Normal pulses.     Heart sounds: Normal heart sounds. No murmur heard.   Pulmonary:     Effort: Pulmonary effort is normal. No respiratory distress.     Breath sounds: Normal breath sounds. No wheezing, rhonchi or rales.  Abdominal:     General: Abdomen is flat. There is no distension.     Palpations: Abdomen is soft. There is no mass.     Tenderness: There is no abdominal tenderness. There is no guarding or rebound.  Musculoskeletal:     Comments: 5/5 strength in upper and lower extremities.  Skin:    General: Skin is warm and dry.  Neurological:     Mental Status: She is alert and oriented to person, place, and time.  Psychiatric:        Behavior: Behavior normal.     BP 126/80 (BP Location: Right Wrist, Patient Position: Sitting, Cuff Size: Normal)   Pulse (!) 59   Temp 98.6 F (37 C) (Oral)   Resp 18   Ht 5\' 8"  (1.727 m)   Wt (!) 374 lb 3.2 oz (169.7 kg)   LMP 06/26/2012   SpO2 97%   BMI 56.90 kg/m  Wt Readings from Last 3 Encounters:  02/01/21 (!) 374 lb 3.2 oz (169.7 kg)  06/29/20 (!) 363 lb (164.7 kg)  12/02/19 (!) 367 lb 9.6 oz (166.7 kg)    Diabetic Foot Exam - Simple   No data filed    Lab Results  Component Value Date   WBC 8.4 06/10/2019   HGB 12.9 06/10/2019    HCT 38.0 06/10/2019   PLT 171.0 06/10/2019   GLUCOSE 107 (H) 06/29/2020   CHOL 160 06/29/2020   TRIG 109 06/29/2020   HDL 53 06/29/2020   LDLCALC 86 06/29/2020   ALT 18 06/29/2020   AST 15 06/29/2020   NA 141 06/29/2020   K 4.3 06/29/2020   CL 103 06/29/2020   CREATININE 0.63 06/29/2020   BUN 20 06/29/2020   CO2 28 06/29/2020   TSH 2.31 06/10/2019   HGBA1C 5.5 06/29/2020   MICROALBUR 2.1 06/29/2020    Lab Results  Component Value Date   TSH 2.31 06/10/2019   Lab Results  Component Value Date   WBC 8.4 06/10/2019   HGB 12.9 06/10/2019   HCT 38.0 06/10/2019   MCV 96.2 06/10/2019   PLT 171.0 06/10/2019   Lab Results  Component Value Date   NA 141 06/29/2020   K 4.3 06/29/2020   CO2 28 06/29/2020   GLUCOSE 107 (H) 06/29/2020   BUN 20 06/29/2020   CREATININE 0.63 06/29/2020   BILITOT 1.0 06/29/2020   ALKPHOS 55 12/02/2019   AST 15 06/29/2020   ALT 18 06/29/2020   PROT 6.9 06/29/2020   ALBUMIN 4.2 12/02/2019   CALCIUM 9.5 06/29/2020   GFR 93.14 12/02/2019   Lab Results  Component Value Date   CHOL 160 06/29/2020   Lab Results  Component Value Date   HDL 53 06/29/2020   Lab Results  Component Value Date   LDLCALC 86 06/29/2020   Lab Results  Component Value Date   TRIG 109 06/29/2020  Lab Results  Component Value Date   CHOLHDL 3.0 06/29/2020   Lab Results  Component Value Date   HGBA1C 5.5 06/29/2020   Colonoscopy- Last completed 08/18/2013. 2 polyps found, otherwise normal. Repeat in 5 years.  Mammogram-Last completed 10/13/2019. Small mass found in right breast, otherwise normal results. Repeat in 1 year.  Dexa- Last completed 10/08/2018. Results are normal. Repeat in 5 years.  PAP Smear- Last 07/26/2015. Normal results. Repeat in 3 years.    Assessment & Plan:   Problem List Items Addressed This Visit      Unprioritized   Diabetes mellitus type II, uncontrolled ([place])    Check labs hgba1c to be checked  minimize simple carbs.  Increase exercise as tolerated. Continue current meds       Essential hypertension    Well controlled, no changes to meds. Encouraged heart healthy diet such as the DASH diet and exercise as tolerated.       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[place])   Morbid obesity with BMI of 50.0-59.9, adult ([facility])    Pt will work on diet and exercise      Preventative health care - Primary    ghm utd Check labs  See avs       Relevant Orders   Lipid panel   Hemoglobin A1c   CBC with Differential/Platelet   Microalbumin / creatinine urine ratio   Comprehensive metabolic panel   Snoring    Refer for sleep eval      Relevant Orders   Ambulatory referral to Neurology    Other Visit Diagnoses    Hyperlipidemia associated with type 2 diabetes mellitus ([place])   (Chronic)     Relevant Orders   Lipid panel   Comprehensive metabolic panel   Primary hypertension       Relevant Orders   Lipid panel   Hemoglobin A1c   CBC with Differential/Platelet   Microalbumin / creatinine urine ratio   Comprehensive metabolic panel   Daytime somnolence       Relevant Orders   Ambulatory referral to Neurology       No orders of the defined types were placed in this encounter.   IAnn Held, DO, personally preformed the services described in this documentation.  All medical record entries made by the scribe were at my direction and in my presence.  I have reviewed the chart and discharge instructions (if applicable) and agree that the record reflects my personal performance and is accurate and complete. 02/01/2021   I,Pam Phillips,acting as a scribe for [name], DO.,have documented all relevant documentation on the behalf of [name], DO,as directed by  [name], DO while in the presence of S[place]zer, DO, have reviewed all documentation for this visit. The documentation on 02/01/21 for the exam, diagnosis, procedures, and orders are all accurate and complete.   [name], DO

## 2021-02-01 NOTE — Assessment & Plan Note (Signed)
ghm utd Check labs  See avs  

## 2021-02-01 NOTE — Assessment & Plan Note (Signed)
Check labs hgba1c to be checked.  minimize simple carbs. Increase exercise as tolerated. Continue current meds  

## 2021-02-01 NOTE — Assessment & Plan Note (Signed)
Refer for sleep eval

## 2021-02-01 NOTE — Assessment & Plan Note (Signed)
Well controlled, no changes to meds. Encouraged heart healthy diet such as the DASH diet and exercise as tolerated.  °

## 2021-02-01 NOTE — Assessment & Plan Note (Signed)
Pt will work on diet and exercise

## 2021-02-01 NOTE — Patient Instructions (Signed)
Preventive Care 84-61 Years Old, Female Preventive care refers to lifestyle choices and visits with your health care provider that can promote health and wellness. This includes:  A yearly physical exam. This is also called an annual wellness visit.  Regular dental and eye exams.  Immunizations.  Screening for certain conditions.  Healthy lifestyle choices, such as: ? Eating a healthy diet. ? Getting regular exercise. ? Not using drugs or products that contain nicotine and tobacco. ? Limiting alcohol use. What can I expect for my preventive care visit? Physical exam Your health care provider will check your:  Height and weight. These may be used to calculate your BMI (body mass index). BMI is a measurement that tells if you are at a healthy weight.  Heart rate and blood pressure.  Body temperature.  Skin for abnormal spots. Counseling Your health care provider may ask you questions about your:  Past medical problems.  Family's medical history.  Alcohol, tobacco, and drug use.  Emotional well-being.  Home life and relationship well-being.  Sexual activity.  Diet, exercise, and sleep habits.  Work and work Statistician.  Access to firearms.  Method of birth control.  Menstrual cycle.  Pregnancy history. What immunizations do I need? Vaccines are usually given at various ages, according to a schedule. Your health care provider will recommend vaccines for you based on your age, medical history, and lifestyle or other factors, such as travel or where you work.   What tests do I need? Blood tests  Lipid and cholesterol levels. These may be checked every 5 years, or more often if you are over 3 years old.  Hepatitis C test.  Hepatitis B test. Screening  Lung cancer screening. You may have this screening every year starting at age 73 if you have a 30-pack-year history of smoking and currently smoke or have quit within the past 15 years.  Colorectal cancer  screening. ? All adults should have this screening starting at age 52 and continuing until age 17. ? Your health care provider may recommend screening at age 49 if you are at increased risk. ? You will have tests every 1-10 years, depending on your results and the type of screening test.  Diabetes screening. ? This is done by checking your blood sugar (glucose) after you have not eaten for a while (fasting). ? You may have this done every 1-3 years.  Mammogram. ? This may be done every 1-2 years. ? Talk with your health care provider about when you should start having regular mammograms. This may depend on whether you have a family history of breast cancer.  BRCA-related cancer screening. This may be done if you have a family history of breast, ovarian, tubal, or peritoneal cancers.  Pelvic exam and Pap test. ? This may be done every 3 years starting at age 10. ? Starting at age 11, this may be done every 5 years if you have a Pap test in combination with an HPV test. Other tests  STD (sexually transmitted disease) testing, if you are at risk.  Bone density scan. This is done to screen for osteoporosis. You may have this scan if you are at high risk for osteoporosis. Talk with your health care provider about your test results, treatment options, and if necessary, the need for more tests. Follow these instructions at home: Eating and drinking  Eat a diet that includes fresh fruits and vegetables, whole grains, lean protein, and low-fat dairy products.  Take vitamin and mineral supplements  as recommended by your health care provider.  Do not drink alcohol if: ? Your health care provider tells you not to drink. ? You are pregnant, may be pregnant, or are planning to become pregnant.  If you drink alcohol: ? Limit how much you have to 0-1 drink a day. ? Be aware of how much alcohol is in your drink. In the [place] one drink equals one 12 oz bottle of beer (355 mL), one 5 oz glass of  wine (148 mL), or one 1 oz glass of hard liquor (44 mL).   Lifestyle  Take daily care of your teeth and gums. Brush your teeth every morning and night with fluoride toothpaste. Floss one time each day.  Stay active. Exercise for at least 30 minutes 5 or more days each week.  Do not use any products that contain nicotine or tobacco, such as cigarettes, e-cigarettes, and chewing tobacco. If you need help quitting, ask your health care provider.  Do not use drugs.  If you are sexually active, practice safe sex. Use a condom or other form of protection to prevent STIs (sexually transmitted infections).  If you do not wish to become pregnant, use a form of birth control. If you plan to become pregnant, see your health care provider for a prepregnancy visit.  If told by your health care provider, take low-dose aspirin daily starting at age 50.  Find healthy ways to cope with stress, such as: ? Meditation, yoga, or listening to music. ? Journaling. ? Talking to a trusted person. ? Spending time with friends and family. Safety  Always wear your seat belt while driving or riding in a vehicle.  Do not drive: ? If you have been drinking alcohol. Do not ride with someone who has been drinking. ? When you are tired or distracted. ? While texting.  Wear a helmet and other protective equipment during sports activities.  If you have firearms in your house, make sure you follow all gun safety procedures. What's next?  Visit your health care provider once a year for an annual wellness visit.  Ask your health care provider how often you should have your eyes and teeth checked.  Stay up to date on all vaccines. This information is not intended to replace advice given to you by your health care provider. Make sure you discuss any questions you have with your health care provider. Document Revised: 07/06/2020 Document Reviewed: 06/13/2018 Elsevier Patient Education  2021 Elsevier Inc.  

## 2021-02-01 NOTE — Assessment & Plan Note (Signed)
Encouraged heart healthy diet, increase exercise, avoid trans fats, consider a krill oil cap daily 

## 2021-02-02 ENCOUNTER — Other Ambulatory Visit: Payer: Self-pay | Admitting: Family Medicine

## 2021-02-02 ENCOUNTER — Encounter: Payer: Self-pay | Admitting: Family Medicine

## 2021-02-02 DIAGNOSIS — F411 Generalized anxiety disorder: Secondary | ICD-10-CM

## 2021-02-02 DIAGNOSIS — E785 Hyperlipidemia, unspecified: Secondary | ICD-10-CM

## 2021-02-02 DIAGNOSIS — R739 Hyperglycemia, unspecified: Secondary | ICD-10-CM

## 2021-02-02 LAB — HEMOGLOBIN A1C: Hgb A1c MFr Bld: 5.7 % (ref 4.6–6.5)

## 2021-02-02 LAB — COMPREHENSIVE METABOLIC PANEL
ALT: 20 U/L (ref 0–35)
AST: 16 U/L (ref 0–37)
Albumin: 4.1 g/dL (ref 3.5–5.2)
Alkaline Phosphatase: 60 U/L (ref 39–117)
BUN: 17 mg/dL (ref 6–23)
CO2: 28 mEq/L (ref 19–32)
Calcium: 9.7 mg/dL (ref 8.4–10.5)
Chloride: 102 mEq/L (ref 96–112)
Creatinine, Ser: 0.65 mg/dL (ref 0.40–1.20)
GFR: 95.54 mL/min (ref >60.00)
Glucose, Bld: 131 mg/dL — ABNORMAL HIGH (ref 70–99)
Potassium: 3.7 mEq/L (ref 3.5–5.1)
Sodium: 140 mEq/L (ref 135–145)
Total Bilirubin: 0.7 mg/dL (ref 0.2–1.2)
Total Protein: 6.8 g/dL (ref 6.0–8.3)

## 2021-02-02 LAB — CBC WITH DIFFERENTIAL/PLATELET
Basophils Absolute: 0.1 10*3/uL (ref 0.0–0.1)
Basophils Relative: 1.3 % (ref 0.0–3.0)
Eosinophils Absolute: 0.2 10*3/uL (ref 0.0–0.7)
Eosinophils Relative: 2.3 % (ref 0.0–5.0)
HCT: 38.7 % (ref 36.0–46.0)
Hemoglobin: 13 g/dL (ref 12.0–15.0)
Lymphocytes Relative: 26.4 % (ref 12.0–46.0)
Lymphs Abs: 1.9 10*3/uL (ref 0.7–4.0)
MCHC: 33.7 g/dL (ref 30.0–36.0)
MCV: 95.3 fl (ref 78.0–100.0)
Monocytes Absolute: 0.5 10*3/uL (ref 0.1–1.0)
Monocytes Relative: 6.3 % (ref 3.0–12.0)
Neutro Abs: 4.6 10*3/uL (ref 1.4–7.7)
Neutrophils Relative %: 63.7 % (ref 43.0–77.0)
Platelets: 171 10*3/uL (ref 150.0–400.0)
RBC: 4.06 Mil/uL (ref 3.87–5.11)
RDW: 13.1 % (ref 11.5–15.5)
WBC: 7.3 10*3/uL (ref 4.0–10.5)

## 2021-02-02 LAB — LIPID PANEL
Cholesterol: 143 mg/dL (ref 0–200)
HDL: 52.1 mg/dL (ref >39.00)
LDL Cholesterol: 59 mg/dL (ref 0–99)
NonHDL: 90.64
Total CHOL/HDL Ratio: 3
Triglycerides: 157 mg/dL — ABNORMAL HIGH (ref 0.0–149.0)
VLDL: 31.4 mg/dL (ref 0.0–40.0)

## 2021-02-02 LAB — MICROALBUMIN / CREATININE URINE RATIO
Creatinine,U: 84.8 mg/dL
Microalb Creat Ratio: 2.9 mg/g (ref 0.0–30.0)
Microalb, Ur: 2.5 mg/dL — ABNORMAL HIGH (ref 0.0–1.9)

## 2021-02-02 NOTE — Telephone Encounter (Signed)
Requesting: lorazepam 0.5mg  Contract: 06/10/2019 UDS: 06/10/2019 Last Visit: 02/01/21 Next Visit: 08/04/21 Last Refill: 12/23/2020 #90 and 0RF Pt sig: 1 tab q8h prn  Please Advise

## 2021-02-08 ENCOUNTER — Other Ambulatory Visit (HOSPITAL_BASED_OUTPATIENT_CLINIC_OR_DEPARTMENT_OTHER): Payer: Self-pay | Admitting: Family Medicine

## 2021-02-08 DIAGNOSIS — Z1231 Encounter for screening mammogram for malignant neoplasm of breast: Secondary | ICD-10-CM

## 2021-02-15 ENCOUNTER — Encounter: Payer: Self-pay | Admitting: Family Medicine

## 2021-02-15 ENCOUNTER — Telehealth: Payer: Self-pay

## 2021-02-15 DIAGNOSIS — F411 Generalized anxiety disorder: Secondary | ICD-10-CM

## 2021-02-15 MED ORDER — LORAZEPAM 0.5 MG PO TABS: 0.5000 mg | ORAL_TABLET | Freq: Three times a day (TID) | ORAL | 0 refills | Status: DC | PRN

## 2021-02-15 NOTE — Telephone Encounter (Signed)
Requesting: Ativan  Contract: 06/13/2019 UDS: 06/13/2019 Last OV: 02/01/2021 Next OV: 08/04/2021 Last Refill:  12/23/2020, #90--0 RF Database:   Please advise

## 2021-02-17 ENCOUNTER — Other Ambulatory Visit: Payer: Self-pay

## 2021-02-17 ENCOUNTER — Ambulatory Visit (HOSPITAL_BASED_OUTPATIENT_CLINIC_OR_DEPARTMENT_OTHER)
Admission: RE | Admit: 2021-02-17 | Discharge: 2021-02-17 | Disposition: A | Discharge status: Home / Self Care | Payer: BC Managed Care – PPO | Source: Ambulatory Visit | Attending: Family Medicine | Admitting: Family Medicine

## 2021-02-17 ENCOUNTER — Encounter (HOSPITAL_BASED_OUTPATIENT_CLINIC_OR_DEPARTMENT_OTHER): Payer: Self-pay

## 2021-02-17 DIAGNOSIS — Z1231 Encounter for screening mammogram for malignant neoplasm of breast: Secondary | ICD-10-CM | POA: Insufficient documentation

## 2021-02-20 ENCOUNTER — Other Ambulatory Visit: Payer: Self-pay | Admitting: Family Medicine

## 2021-02-20 DIAGNOSIS — F418 Other specified anxiety disorders: Secondary | ICD-10-CM

## 2021-03-28 ENCOUNTER — Other Ambulatory Visit: Payer: Self-pay | Admitting: Family Medicine

## 2021-03-28 ENCOUNTER — Institutional Professional Consult (permissible substitution): Payer: BC Managed Care – PPO | Admitting: Neurology

## 2021-03-28 DIAGNOSIS — E1165 Type 2 diabetes mellitus with hyperglycemia: Secondary | ICD-10-CM

## 2021-04-19 ENCOUNTER — Encounter: Payer: Self-pay | Admitting: Family Medicine

## 2021-04-19 ENCOUNTER — Other Ambulatory Visit: Payer: Self-pay | Admitting: Family Medicine

## 2021-04-19 DIAGNOSIS — F411 Generalized anxiety disorder: Secondary | ICD-10-CM

## 2021-04-19 MED ORDER — LORAZEPAM 0.5 MG PO TABS
0.5000 mg | ORAL_TABLET | Freq: Three times a day (TID) | ORAL | 0 refills | Status: DC | PRN
Start: 2021-04-19 — End: 2021-06-21

## 2021-05-23 ENCOUNTER — Institutional Professional Consult (permissible substitution): Payer: BC Managed Care – PPO | Admitting: Neurology

## 2021-06-21 ENCOUNTER — Encounter: Payer: Self-pay | Admitting: Family Medicine

## 2021-06-21 DIAGNOSIS — F411 Generalized anxiety disorder: Secondary | ICD-10-CM

## 2021-06-21 MED ORDER — LORAZEPAM 0.5 MG PO TABS: 0.5000 mg | ORAL_TABLET | Freq: Three times a day (TID) | ORAL | 0 refills | Status: DC | PRN

## 2021-06-21 NOTE — Telephone Encounter (Signed)
Requesting: Ativan Contract: 06/13/19 UDS: 06/13/19 Last OV: 02/01/21 Next OV: 08/15/21 Last Refill: 04/19/21, #90--0 RF Database:   Please advise

## 2021-07-04 ENCOUNTER — Other Ambulatory Visit: Payer: Self-pay

## 2021-07-04 ENCOUNTER — Encounter (INDEPENDENT_AMBULATORY_CARE_PROVIDER_SITE_OTHER): Payer: BC Managed Care – PPO | Admitting: Ophthalmology

## 2021-07-04 DIAGNOSIS — H35033 Hypertensive retinopathy, bilateral: Secondary | ICD-10-CM

## 2021-07-04 DIAGNOSIS — I10 Essential (primary) hypertension: Secondary | ICD-10-CM | POA: Diagnosis not present

## 2021-07-04 DIAGNOSIS — D3132 Benign neoplasm of left choroid: Secondary | ICD-10-CM

## 2021-07-04 DIAGNOSIS — H43813 Vitreous degeneration, bilateral: Secondary | ICD-10-CM | POA: Diagnosis not present

## 2021-08-01 ENCOUNTER — Other Ambulatory Visit: Payer: Self-pay | Admitting: Family Medicine

## 2021-08-04 ENCOUNTER — Ambulatory Visit: Payer: BC Managed Care – PPO | Admitting: Family Medicine

## 2021-08-12 ENCOUNTER — Ambulatory Visit: Payer: BC Managed Care – PPO | Admitting: Family Medicine

## 2021-08-15 ENCOUNTER — Encounter: Payer: Self-pay | Admitting: Family Medicine

## 2021-08-15 ENCOUNTER — Other Ambulatory Visit: Payer: Self-pay

## 2021-08-15 ENCOUNTER — Ambulatory Visit: Payer: BC Managed Care – PPO | Admitting: Family Medicine

## 2021-08-15 VITALS — BP 158/90 | HR 61 | Temp 98.2°F | Resp 18 | Wt 375.1 lb

## 2021-08-15 DIAGNOSIS — I10 Essential (primary) hypertension: Secondary | ICD-10-CM

## 2021-08-15 DIAGNOSIS — F419 Anxiety disorder, unspecified: Secondary | ICD-10-CM | POA: Diagnosis not present

## 2021-08-15 DIAGNOSIS — Z23 Encounter for immunization: Secondary | ICD-10-CM

## 2021-08-15 DIAGNOSIS — E1165 Type 2 diabetes mellitus with hyperglycemia: Secondary | ICD-10-CM | POA: Diagnosis not present

## 2021-08-15 DIAGNOSIS — E1169 Type 2 diabetes mellitus with other specified complication: Secondary | ICD-10-CM

## 2021-08-15 DIAGNOSIS — Z6841 Body Mass Index (BMI) 40.0 and over, adult: Secondary | ICD-10-CM

## 2021-08-15 DIAGNOSIS — E785 Hyperlipidemia, unspecified: Secondary | ICD-10-CM

## 2021-08-15 DIAGNOSIS — R609 Edema, unspecified: Secondary | ICD-10-CM | POA: Insufficient documentation

## 2021-08-15 DIAGNOSIS — E119 Type 2 diabetes mellitus without complications: Secondary | ICD-10-CM

## 2021-08-15 LAB — COMPREHENSIVE METABOLIC PANEL
ALT: 16 U/L (ref 0–35)
AST: 14 U/L (ref 0–37)
Albumin: 4.2 g/dL (ref 3.5–5.2)
Alkaline Phosphatase: 60 U/L (ref 39–117)
BUN: 18 mg/dL (ref 6–23)
CO2: 31 mEq/L (ref 19–32)
Calcium: 9.2 mg/dL (ref 8.4–10.5)
Chloride: 102 mEq/L (ref 96–112)
Creatinine, Ser: 0.66 mg/dL (ref 0.40–1.20)
GFR: 94.84 mL/min (ref >60.00)
Glucose, Bld: 119 mg/dL — ABNORMAL HIGH (ref 70–99)
Potassium: 4.2 mEq/L (ref 3.5–5.1)
Sodium: 141 mEq/L (ref 135–145)
Total Bilirubin: 0.9 mg/dL (ref 0.2–1.2)
Total Protein: 6.7 g/dL (ref 6.0–8.3)

## 2021-08-15 LAB — CBC WITH DIFFERENTIAL/PLATELET
Basophils Absolute: 0 10*3/uL (ref 0.0–0.1)
Basophils Relative: 0.6 % (ref 0.0–3.0)
Eosinophils Absolute: 0.1 10*3/uL (ref 0.0–0.7)
Eosinophils Relative: 1.8 % (ref 0.0–5.0)
HCT: 37.3 % (ref 36.0–46.0)
Hemoglobin: 12.4 g/dL (ref 12.0–15.0)
Lymphocytes Relative: 22.2 % (ref 12.0–46.0)
Lymphs Abs: 1.4 10*3/uL (ref 0.7–4.0)
MCHC: 33.2 g/dL (ref 30.0–36.0)
MCV: 95.8 fl (ref 78.0–100.0)
Monocytes Absolute: 0.5 10*3/uL (ref 0.1–1.0)
Monocytes Relative: 7.3 % (ref 3.0–12.0)
Neutro Abs: 4.4 10*3/uL (ref 1.4–7.7)
Neutrophils Relative %: 68.1 % (ref 43.0–77.0)
Platelets: 163 10*3/uL (ref 150.0–400.0)
RBC: 3.9 Mil/uL (ref 3.87–5.11)
RDW: 13.2 % (ref 11.5–15.5)
WBC: 6.5 10*3/uL (ref 4.0–10.5)

## 2021-08-15 LAB — HEMOGLOBIN A1C: Hgb A1c MFr Bld: 5.8 % (ref 4.6–6.5)

## 2021-08-15 LAB — LIPID PANEL
Cholesterol: 137 mg/dL (ref 0–200)
HDL: 49.3 mg/dL (ref >39.00)
LDL Cholesterol: 75 mg/dL (ref 0–99)
NonHDL: 88.12
Total CHOL/HDL Ratio: 3
Triglycerides: 67 mg/dL (ref 0.0–149.0)
VLDL: 13.4 mg/dL (ref 0.0–40.0)

## 2021-08-15 MED ORDER — AMLODIPINE BESYLATE 2.5 MG PO TABS: 2.5000 mg | ORAL_TABLET | Freq: Every day | ORAL | 2 refills | Status: DC

## 2021-08-15 MED ORDER — FUROSEMIDE 20 MG PO TABS: 20.0000 mg | ORAL_TABLET | Freq: Every day | ORAL | 3 refills | Status: DC

## 2021-08-15 NOTE — Assessment & Plan Note (Signed)
Stable con't prozac and prn ativan Pt is under a lot of stress now but counseling is helping  She wants to leave the meds as is

## 2021-08-15 NOTE — Patient Instructions (Signed)

## 2021-08-15 NOTE — Progress Notes (Signed)
Established Patient Office Visit  Subjective:  Patient ID: Pam Phillips, female    DOB: January 29, 1961  Age: 60 y.o. MRN: [id number]  CC:  Chief Complaint  Patient presents with   6 month f/u    HPI Pam Phillips presents for f/u bp, chol and dm.   HYPERTENSION  Blood pressure range-not checking   Chest pain- no      Dyspnea- no Lightheadedness- no   Edema- yes Other side effects - no   Medication compliance: good Low salt diet- no  DIABETES  Blood Sugar ranges-130s   Polyuria- no New Visual problems- no Hypoglycemic symptoms- no Other side effects-no Medication compliance - good Last eye exam- this past summer Foot exam- today  HYPERLIPIDEMIA  Medication compliance- good RUQ pain- no  Muscle aches- no Other side effects-no     Past Medical History:  Diagnosis Date   Ankle swelling 07-04-12   mostly left ankle -wears compression hose occ.   Anxiety    Cancer (HCC) 07-04-12   dx. Melanoma in situ -back(mid-scapula area)   Heart murmur    Hyperlipidemia    Hypertension     Past Surgical History:  Procedure Laterality Date   CHOLECYSTECTOMY  07-04-12   '03-Lap. due to gallstones-[place]   COLONOSCOPY N/A 08/18/2013   Procedure: COLONOSCOPY;  Surgeon: Beverley Fiedler, MD;  Location: [facility];  Service: Gastroenterology;  Laterality: N/A;   DENTAL SURGERY  07-04-12   2 wisdom teeth and 2 other teeth   MELANOMA EXCISION  07/10/2012   Procedure: MELANOMA EXCISION;  Surgeon: Almond Lint, MD;  Location: [facility];  Service: General;  Laterality: N/A;  excision of back melanoma insitu    Family History  Problem Relation Age of Onset   Hypertension Father    COPD Father    Heart disease Father 54       MI   Cancer Father 68       prostate ca   Colon cancer Mother    Alzheimer's disease Mother    Coronary artery disease Other 70       1st degree female   Pancreatic cancer Neg Hx    Stomach cancer Neg Hx     Social History    Socioeconomic History   Marital status: Married    Spouse name: Not on file   Number of children: Not on file   Years of education: Not on file   Highest education level: Not on file  Occupational History   Occupation: Neurosurgeon    Employer: [employer]  Tobacco Use   Smoking status: Never   Smokeless tobacco: Never  Substance and Sexual Activity   Alcohol use: Yes    Alcohol/week: 1.0 standard drink    Types: 1 Glasses of wine per week    Comment: r-occ. social 1 wine per week   Drug use: No   Sexual activity: Yes    Partners: Male  Other Topics Concern   Not on file  Social History Narrative   Exercise-- not right now   Social Determinants of Health   Financial Resource Strain: Not on file  Food Insecurity: Not on file  Transportation Needs: Not on file  Physical Activity: Not on file  Stress: Not on file  Social Connections: Not on file  Intimate Partner Violence: Not on file    Outpatient Medications Prior to Visit  Medication Sig Dispense Refill   ACCU-CHEK GUIDE test strip TEST UP TO FOUR TIMES DAILY 100  strip 1   aspirin 81 MG EC tablet Take 81 mg by mouth every morning.     blood glucose meter kit and supplies KIT Dispense based on patient and insurance preference. Use up to four times daily as directed. (FOR ICD-9 250.00, 250.01). 1 each 0   fish oil-omega-3 fatty acids 1000 MG capsule Take 1 g by mouth 2 (two) times daily.      Flaxseed, Linseed, (FLAX SEED OIL PO) Take by mouth.     FLUoxetine (PROZAC) 20 MG tablet Take 1 tablet (20 mg total) by mouth daily. 90 tablet 1   GLUCOSAMINE-CHONDROITIN PO Take 1 capsule by mouth 2 (two) times daily.     LORazepam (ATIVAN) 0.5 MG tablet Take 1 tablet (0.5 mg total) by mouth every 8 (eight) hours as needed for anxiety. 90 tablet 0   lovastatin (MEVACOR) 40 MG tablet Take 1 tablet (40 mg total) by mouth at bedtime. 90 tablet 1   metFORMIN (GLUCOPHAGE) 500 MG tablet TAKE 1 TABLET(500 MG) BY MOUTH  DAILY WITH BREAKFAST 90 tablet 1   Multiple Vitamin (MULTIVITAMIN) capsule Take 1 capsule by mouth daily.     nystatin (NYSTATIN) powder Apply topically 3 (three) times daily as needed. 60 g 5   olmesartan-hydrochlorothiazide (BENICAR HCT) 40-25 MG tablet TAKE 1 TABLET BY MOUTH DAILY 90 tablet 1   polyvinyl alcohol (LIQUIFILM TEARS) 1.4 % ophthalmic solution Place 1 drop into both eyes daily as needed (For dry eyes.).     tolterodine (DETROL) 2 MG tablet Take 1 tablet (2 mg total) by mouth 2 (two) times daily. 180 tablet 3   trimethoprim-polymyxin b (POLYTRIM) ophthalmic solution Place 1 drop into the left eye every 6 (six) hours. 10 mL 0   No facility-administered medications prior to visit.    Allergies  Allergen Reactions   Other Swelling    Peaches(fresh) causes throat itching-swelling   Codeine     Nausea & vomiting   Penicillins     angioedema    ROS Review of Systems  Constitutional:  Negative for chills and fever.  HENT:  Negative for congestion and hearing loss.   Eyes:  Negative for discharge.  Respiratory:  Negative for cough and shortness of breath.   Cardiovascular:  Negative for chest pain, palpitations and leg swelling.  Gastrointestinal:  Negative for abdominal pain, blood in stool, constipation, diarrhea, nausea and vomiting.  Genitourinary:  Negative for dysuria, frequency, hematuria and urgency.  Musculoskeletal:  Negative for back pain and myalgias.  Skin:  Negative for rash.  Allergic/Immunologic: Negative for environmental allergies.  Neurological:  Negative for dizziness, weakness and headaches.  Hematological:  Does not bruise/bleed easily.  Psychiatric/Behavioral:  Negative for suicidal ideas. The patient is not nervous/anxious.      Objective:    Physical Exam Vitals and nursing note reviewed.  Constitutional:      Appearance: She is well-developed.  HENT:     Head: Normocephalic and atraumatic.  Eyes:     Conjunctiva/sclera: Conjunctivae  normal.  Neck:     Thyroid: No thyromegaly.     Vascular: No carotid bruit or JVD.  Cardiovascular:     Rate and Rhythm: Normal rate and regular rhythm.     Heart sounds: Normal heart sounds. No murmur heard. Pulmonary:     Effort: Pulmonary effort is normal. No respiratory distress.     Breath sounds: Normal breath sounds. No wheezing or rales.  Chest:     Chest wall: No tenderness.  Musculoskeletal:  General: Swelling present.     Cervical back: Normal range of motion and neck supple.     Right lower leg: Edema present.     Left lower leg: Edema present.  Neurological:     Mental Status: She is alert and oriented to person, place, and time.  Psychiatric:        Mood and Affect: Mood normal.        Behavior: Behavior normal.        Thought Content: Thought content normal.        Judgment: Judgment normal.   Diabetic Foot Exam - Simple   Simple Foot Form Diabetic Foot exam was performed with the following findings: Yes 08/15/2021  8:55 AM  Visual Inspection No deformities, no ulcerations, no other skin breakdown bilaterally: Yes Sensation Testing Intact to touch and monofilament testing bilaterally: Yes Pulse Check Posterior Tibialis and Dorsalis pulse intact bilaterally: Yes Comments    BP (!) 158/90 (BP Location: Right Arm, Patient Position: Sitting, Cuff Size: Large)   Pulse 61   Temp 98.2 F (36.8 C) (Oral)   Resp 18   Wt (!) 375 lb 2 oz (170.2 kg)   LMP 06/26/2012   SpO2 97%   BMI 57.04 kg/m  Wt Readings from Last 3 Encounters:  08/15/21 (!) 375 lb 2 oz (170.2 kg)  02/01/21 (!) 374 lb 3.2 oz (169.7 kg)  06/29/20 (!) 363 lb (164.7 kg)     Health Maintenance Due  Topic Date Due   COLONOSCOPY (Pts 45-31yrs Insurance coverage will need to be confirmed)  08/18/2018   COVID-19 Vaccine (4 - Booster for [place] series) 11/08/2020   Pneumococcal Vaccine 37-69 Years old (2 - PCV) 12/01/2020   HEMOGLOBIN A1C  08/03/2021   PAP SMEAR-Modifier  10/08/2021     There are no preventive care reminders to display for this patient.  Lab Results  Component Value Date   TSH 2.31 06/10/2019   Lab Results  Component Value Date   WBC 7.3 02/01/2021   HGB 13.0 02/01/2021   HCT 38.7 02/01/2021   MCV 95.3 02/01/2021   PLT 171.0 02/01/2021   Lab Results  Component Value Date   NA 140 02/01/2021   K 3.7 02/01/2021   CO2 28 02/01/2021   GLUCOSE 131 (H) 02/01/2021   BUN 17 02/01/2021   CREATININE 0.65 02/01/2021   BILITOT 0.7 02/01/2021   ALKPHOS 60 02/01/2021   AST 16 02/01/2021   ALT 20 02/01/2021   PROT 6.8 02/01/2021   ALBUMIN 4.1 02/01/2021   CALCIUM 9.7 02/01/2021   GFR 95.54 02/01/2021   Lab Results  Component Value Date   CHOL 143 02/01/2021   Lab Results  Component Value Date   HDL 52.10 02/01/2021   Lab Results  Component Value Date   LDLCALC 59 02/01/2021   Lab Results  Component Value Date   TRIG 157.0 (H) 02/01/2021   Lab Results  Component Value Date   CHOLHDL 3 02/01/2021   Lab Results  Component Value Date   HGBA1C 5.7 02/01/2021      Assessment & Plan:   Problem List Items Addressed This Visit       Unprioritized   Anxiety    Stable con't prozac and prn ativan Pt is under a lot of stress now but counseling is helping  She wants to leave the meds as is       Diabetes mellitus type II, non insulin dependent ([place])    hgba1c to be checked, minimize  simple carbs. Increase exercise as tolerated. Continue current meds      Essential hypertension    Well controlled, no changes to meds. Encouraged heart healthy diet such as the DASH diet and exercise as tolerated.       Relevant Medications   furosemide (LASIX) 20 MG tablet   amLODipine (NORVASC) 2.5 MG tablet   Hyperlipidemia LDL goal <100    Tolerating statin, encouraged heart healthy diet, avoid trans fats, minimize simple carbs and saturated fats. Increase exercise as tolerated      Relevant Medications   furosemide (LASIX) 20 MG tablet    amLODipine (NORVASC) 2.5 MG tablet   Morbid obesity with BMI of 50.0-59.9, adult (HCC)    con't with diet  Exercise as tolerated       Other Visit Diagnoses     Type 2 diabetes mellitus with hyperglycemia, without long-term current use of insulin (HCC)    -  Primary   Relevant Orders   CBC with Differential/Platelet   Comprehensive metabolic panel   Hemoglobin A1c   Lipid panel   Microalbumin / creatinine urine ratio   Primary hypertension       Relevant Medications   furosemide (LASIX) 20 MG tablet   amLODipine (NORVASC) 2.5 MG tablet   Other Relevant Orders   CBC with Differential/Platelet   Comprehensive metabolic panel   Hemoglobin A1c   Lipid panel   Microalbumin / creatinine urine ratio   Hyperlipidemia associated with type 2 diabetes mellitus (HCC)       Relevant Medications   furosemide (LASIX) 20 MG tablet   amLODipine (NORVASC) 2.5 MG tablet   Other Relevant Orders   CBC with Differential/Platelet   Comprehensive metabolic panel   Hemoglobin A1c   Lipid panel   Microalbumin / creatinine urine ratio   Edema, unspecified type       Relevant Medications   furosemide (LASIX) 20 MG tablet   Need for influenza vaccination       Relevant Orders   Flu Vaccine QUAD 10mo+IM (Fluarix, Fluzone & Alfiuria Quad PF) (Completed)       Meds ordered this encounter  Medications   furosemide (LASIX) 20 MG tablet    Sig: Take 1 tablet (20 mg total) by mouth daily.    Dispense:  30 tablet    Refill:  3   amLODipine (NORVASC) 2.5 MG tablet    Sig: Take 1 tablet (2.5 mg total) by mouth daily.    Dispense:  30 tablet    Refill:  2     Follow-up: Return in about 2 weeks (around 08/29/2021) for hypertension----f/u in 6 months with me for cpe.    [name], DO

## 2021-08-15 NOTE — Assessment & Plan Note (Signed)
Tolerating statin, encouraged heart healthy diet, avoid trans fats, minimize simple carbs and saturated fats. Increase exercise as tolerated 

## 2021-08-15 NOTE — Assessment & Plan Note (Signed)
con't with diet  Exercise as tolerated

## 2021-08-15 NOTE — Assessment & Plan Note (Signed)
Elevate legs con't compression  Add lasix daily Eat/ drink K rich foods  F/u 2-3 weeks

## 2021-08-15 NOTE — Assessment & Plan Note (Addendum)
Poorly controlled will alter medications, encouraged DASH diet, minimize caffeine and obtain adequate sleep. Report concerning symptoms and follow up as directed and as needed Add norvasc 2.5 mg  F/u 2-3 weeks

## 2021-08-15 NOTE — Assessment & Plan Note (Signed)
hgba1c to be checked, minimize simple carbs. Increase exercise as tolerated. Continue current meds  

## 2021-08-17 NOTE — Addendum Note (Signed)
Addended by: Kelle Darting A on: 08/17/2021 07:49 AM   Modules accepted: Orders

## 2021-08-26 ENCOUNTER — Other Ambulatory Visit: Payer: Self-pay | Admitting: Family Medicine

## 2021-08-26 DIAGNOSIS — F418 Other specified anxiety disorders: Secondary | ICD-10-CM

## 2021-08-30 ENCOUNTER — Ambulatory Visit (INDEPENDENT_AMBULATORY_CARE_PROVIDER_SITE_OTHER): Payer: BC Managed Care – PPO

## 2021-08-30 ENCOUNTER — Other Ambulatory Visit: Payer: Self-pay

## 2021-08-30 DIAGNOSIS — I10 Essential (primary) hypertension: Secondary | ICD-10-CM

## 2021-08-30 NOTE — Progress Notes (Signed)
Pt here for Blood pressure check per Dr Etter Sjogren  Pt currently takes:  furosemide (LASIX) 20 MG tablet amLODipine (NORVASC) 2.5 MG tablet- added at last OV  Pt reports compliance with medication.  BP today @ = 120/ 74 HR = 63  Pt advised per Dr Etter Sjogren to continue current medication regimen.

## 2021-09-06 ENCOUNTER — Encounter: Payer: Self-pay | Admitting: Family Medicine

## 2021-09-06 DIAGNOSIS — F411 Generalized anxiety disorder: Secondary | ICD-10-CM

## 2021-09-06 MED ORDER — LORAZEPAM 0.5 MG PO TABS: 0.5000 mg | ORAL_TABLET | Freq: Three times a day (TID) | ORAL | 0 refills | Status: DC | PRN

## 2021-09-06 NOTE — Telephone Encounter (Signed)
Requesting: Ativan Contract: 2020 UDS: 2020 Last OV: 1031/22 Next OV: 02/13/22 Last Refill: 06/21/21, #90--0 RF Database:   Please advise

## 2021-10-04 ENCOUNTER — Other Ambulatory Visit: Payer: Self-pay | Admitting: Family Medicine

## 2021-10-04 DIAGNOSIS — E785 Hyperlipidemia, unspecified: Secondary | ICD-10-CM

## 2021-10-17 ENCOUNTER — Other Ambulatory Visit: Payer: Self-pay | Admitting: Family Medicine

## 2021-10-17 DIAGNOSIS — R32 Unspecified urinary incontinence: Secondary | ICD-10-CM

## 2021-11-14 ENCOUNTER — Other Ambulatory Visit: Payer: Self-pay | Admitting: Family Medicine

## 2021-11-14 DIAGNOSIS — I10 Essential (primary) hypertension: Secondary | ICD-10-CM

## 2021-11-15 ENCOUNTER — Encounter: Payer: Self-pay | Admitting: Family Medicine

## 2021-11-15 DIAGNOSIS — F411 Generalized anxiety disorder: Secondary | ICD-10-CM

## 2021-11-15 MED ORDER — LORAZEPAM 0.5 MG PO TABS: 0.5000 mg | ORAL_TABLET | Freq: Three times a day (TID) | ORAL | 0 refills | Status: DC | PRN

## 2021-11-15 NOTE — Telephone Encounter (Signed)
*  See patients second question as well*  Requesting: Ativan Contract: 06/13/19 UDS: 06/13/19 Last OV: 08/15/2021 Next OV: 02/13/2022 Last Refill: 09/06/2022, #90--0 RF Database:   Please advise

## 2021-11-21 ENCOUNTER — Other Ambulatory Visit: Payer: Self-pay | Admitting: Family Medicine

## 2021-11-27 ENCOUNTER — Telehealth: Payer: BC Managed Care – PPO | Admitting: Physician Assistant

## 2021-11-27 DIAGNOSIS — J069 Acute upper respiratory infection, unspecified: Secondary | ICD-10-CM | POA: Diagnosis not present

## 2021-11-27 MED ORDER — BENZONATATE 100 MG PO CAPS: 100.0000 mg | ORAL_CAPSULE | Freq: Three times a day (TID) | ORAL | 0 refills | Status: DC | PRN

## 2021-11-27 MED ORDER — IPRATROPIUM BROMIDE 0.03 % NA SOLN: 2.0000 | Freq: Two times a day (BID) | NASAL | 0 refills | Status: DC

## 2021-11-27 NOTE — Progress Notes (Signed)
E-Visit for Upper Respiratory Infection  ° °We are sorry you are not feeling well.  Here is how we plan to help! ° °Based on what you have shared with me, it looks like you may have a viral upper respiratory infection.  Upper respiratory infections are caused by a large number of viruses; however, rhinovirus is the most common cause.  ° °Symptoms vary from person to person, with common symptoms including sore throat, cough, fatigue or lack of energy and feeling of general discomfort.  A low-grade fever of up to 100.4 may present, but is often uncommon.  Symptoms vary however, and are closely related to a person's age or underlying illnesses.  The most common symptoms associated with an upper respiratory infection are nasal discharge or congestion, cough, sneezing, headache and pressure in the ears and face.  These symptoms usually persist for about 3 to 10 days, but can last up to 2 weeks.  It is important to know that upper respiratory infections do not cause serious illness or complications in most cases.   ° °Upper respiratory infections can be transmitted from person to person, with the most common method of transmission being a person's hands.  The virus is able to live on the skin and can infect other persons for up to 2 hours after direct contact.  Also, these can be transmitted when someone coughs or sneezes; thus, it is important to cover the mouth to reduce this risk.  To keep the spread of the illness at bay, good hand hygiene is very important. ° °This is an infection that is most likely caused by a virus. There are no specific treatments other than to help you with the symptoms until the infection runs its course.  We are sorry you are not feeling well.  Here is how we plan to help! ° ° °For nasal congestion, you may use an oral decongestants such as Mucinex D or if you have glaucoma or high blood pressure use plain Mucinex.  Saline nasal spray or nasal drops can help and can safely be used as often as  needed for congestion.  For your congestion, I have prescribed Ipratropium Bromide nasal spray 0.03% two sprays in each nostril 2-3 times a day ° °If you do not have a history of heart disease, hypertension, diabetes or thyroid disease, prostate/bladder issues or glaucoma, you may also use Sudafed to treat nasal congestion.  It is highly recommended that you consult with a pharmacist or your primary care physician to ensure this medication is safe for you to take.    ° °If you have a cough, you may use cough suppressants such as Delsym and Robitussin.  If you have glaucoma or high blood pressure, you can also use Coricidin HBP.   °For cough I have prescribed for you A prescription cough medication called Tessalon Perles 100 mg. You may take 1-2 capsules every 8 hours as needed for cough ° °If you have a sore or scratchy throat, use a saltwater gargle- ¼ to ½ teaspoon of salt dissolved in a 4-ounce to 8-ounce glass of warm water.  Gargle the solution for approximately 15-30 seconds and then spit.  It is important not to swallow the solution.  You can also use throat lozenges/cough drops and Chloraseptic spray to help with throat pain or discomfort.  Warm or cold liquids can also be helpful in relieving throat pain. ° °For headache, pain or general discomfort, you can use Ibuprofen or Tylenol as directed.   °  Some authorities believe that zinc sprays or the use of Echinacea may shorten the course of your symptoms.  Your current symptoms could be consistent with the coronavirus.  Many health care providers can now test patients at their office but not all are.  [facility] has multiple testing sites. For information on our COVID testing locations and hours go to [email or web address]  At home Covid 19 testing kits can be used for testing.   Testing Information: The COVID-19 Community Testing sites are testing BY APPOINTMENT ONLY.  You can schedule online at [email or web address]  If you do not have access to a  smart phone or computer you may call [phone] for an appointment.   Additional testing sites in the Community:  For [facility] Testing sites in   FaceUpdate.uy  For Pop-up testing sites in [place]  BowlDirectory.co.uk  For [facility] [email or web address]  For [facility] testing in [place] and [employer] [email or web address]  For [facility] testing in [place]   [email or web address]  For  more information about community testing call [phone]   Please quarantine yourself while awaiting your test results. Please stay home for a minimum of 10 days from the first day of illness with improving symptoms and you have had 24 hours of no fever (without the use of Tylenol (Acetaminophen) Motrin (Ibuprofen) or any fever reducing medication).  Also - Do not get tested prior to returning to work because once you have had a positive test the test can stay positive for more than a month in some cases.   You should wear a mask or cloth face covering over your nose and mouth if you must be around other people or animals, including pets (even at home). Try to stay at least 6 feet away from other people. This will protect the people around you.  Please continue good preventive care measures, including:  frequent hand-washing, avoid touching your face, cover coughs/sneezes, stay out of crowds and keep a 6 foot distance from others.  COVID-19 is a respiratory illness with symptoms that are similar to the flu. Symptoms are typically mild to moderate, but there have been cases of severe illness and death due to the virus.   The following symptoms may  appear 2-14 days after exposure: Fever Cough Shortness of breath or difficulty breathing Chills Repeated shaking with chills Muscle pain Headache Sore throat New loss of taste or smell Fatigue Congestion or runny nose Nausea or vomiting Diarrhea  Go to the nearest hospital ED for assessment if fever/cough/breathlessness are severe or illness seems like a threat to life.  It is vitally important that if you feel that you have an infection such as this virus or any other virus that you stay home and away from places where you may spread it to others.  You should avoid contact with people age 72 and older.    HOME CARE Only take medications as instructed by your medical team. Be sure to drink plenty of fluids. Water is fine as well as fruit juices, sodas and electrolyte beverages. You may want to stay away from caffeine or alcohol. If you are nauseated, try taking small sips of liquids. How do you know if you are getting enough fluid? Your urine should be a pale yellow or almost colorless. Get rest. Taking a steamy shower or using a humidifier may help nasal congestion and ease sore throat pain. You can place a towel over your head and breathe in the steam from hot water  coming from a faucet. Using a saline nasal spray works much the same way. Cough drops, hard candies and sore throat lozenges may ease your cough. Avoid close contacts especially the very young and the elderly Cover your mouth if you cough or sneeze Always remember to wash your hands.   GET HELP RIGHT AWAY IF: You develop worsening fever. If your symptoms do not improve within 10 days You develop yellow or green discharge from your nose over 3 days. You have coughing fits You develop a severe head ache or visual changes. You develop shortness of breath, difficulty breathing or start having chest pain Your symptoms persist after you have completed your treatment plan  MAKE SURE YOU  Understand these  instructions. Will watch your condition. Will get help right away if you are not doing well or get worse.  Thank you for choosing an e-visit.  Your e-visit answers were reviewed by a board certified advanced clinical practitioner to complete your personal care plan. Depending upon the condition, your plan could have included both over the counter or prescription medications.  Please review your pharmacy choice. Make sure the pharmacy is open so you can pick up prescription now. If there is a problem, you may contact your provider through [employer] and have the prescription routed to another pharmacy.  Your safety is important to [place]. If you have drug allergies check your prescription carefully.   For the next 24 hours you can use MyChart to ask questions about today's visit, request a non-urgent call back, or ask for a work or school excuse. You will get an email in the next two days asking about your experience. I hope that your e-visit has been valuable and will speed your recovery.   I provided 5 minutes of non face-to-face time during this encounter for chart review and documentation.

## 2021-11-28 ENCOUNTER — Other Ambulatory Visit: Payer: Self-pay | Admitting: Family Medicine

## 2021-11-28 DIAGNOSIS — E1165 Type 2 diabetes mellitus with hyperglycemia: Secondary | ICD-10-CM

## 2021-12-22 ENCOUNTER — Other Ambulatory Visit: Payer: Self-pay | Admitting: Family Medicine

## 2021-12-22 DIAGNOSIS — R609 Edema, unspecified: Secondary | ICD-10-CM

## 2021-12-23 ENCOUNTER — Other Ambulatory Visit: Payer: Self-pay | Admitting: Family Medicine

## 2021-12-23 DIAGNOSIS — F418 Other specified anxiety disorders: Secondary | ICD-10-CM

## 2022-01-16 ENCOUNTER — Encounter: Payer: Self-pay | Admitting: Family Medicine

## 2022-01-16 DIAGNOSIS — R400 Somnolence: Secondary | ICD-10-CM

## 2022-01-16 DIAGNOSIS — R0683 Snoring: Secondary | ICD-10-CM

## 2022-01-17 ENCOUNTER — Other Ambulatory Visit: Payer: Self-pay | Admitting: Family Medicine

## 2022-01-17 DIAGNOSIS — F411 Generalized anxiety disorder: Secondary | ICD-10-CM

## 2022-01-17 NOTE — Telephone Encounter (Signed)
Requesting: lorazepam 0.'5mg'$   ?Contract: 06/10/2019 ?UDS: 06/10/2019 ?Last Visit: 08/15/2021 ?Next Visit: 03/16/22 ?Last Refill: 11/15/2021 #90 and 0RF ? ?Please Advise ? ?

## 2022-01-19 MED ORDER — LORAZEPAM 0.5 MG PO TABS: 0.5000 mg | ORAL_TABLET | Freq: Three times a day (TID) | ORAL | 0 refills | Status: DC | PRN

## 2022-02-08 ENCOUNTER — Other Ambulatory Visit: Payer: Self-pay | Admitting: Family Medicine

## 2022-02-08 DIAGNOSIS — B354 Tinea corporis: Secondary | ICD-10-CM

## 2022-02-12 ENCOUNTER — Other Ambulatory Visit: Payer: Self-pay | Admitting: Family Medicine

## 2022-02-13 ENCOUNTER — Encounter: Payer: BC Managed Care – PPO | Admitting: Family Medicine

## 2022-03-16 ENCOUNTER — Encounter: Payer: Self-pay | Admitting: Family Medicine

## 2022-03-16 ENCOUNTER — Ambulatory Visit: Payer: BC Managed Care – PPO | Admitting: Family Medicine

## 2022-03-16 VITALS — BP 130/82 | HR 55 | Temp 97.6°F | Resp 18 | Ht 68.0 in | Wt 375.0 lb

## 2022-03-16 DIAGNOSIS — E785 Hyperlipidemia, unspecified: Secondary | ICD-10-CM

## 2022-03-16 DIAGNOSIS — E1169 Type 2 diabetes mellitus with other specified complication: Secondary | ICD-10-CM

## 2022-03-16 DIAGNOSIS — E119 Type 2 diabetes mellitus without complications: Secondary | ICD-10-CM

## 2022-03-16 DIAGNOSIS — I10 Essential (primary) hypertension: Secondary | ICD-10-CM

## 2022-03-16 DIAGNOSIS — Z1211 Encounter for screening for malignant neoplasm of colon: Secondary | ICD-10-CM | POA: Diagnosis not present

## 2022-03-16 DIAGNOSIS — Z6841 Body Mass Index (BMI) 40.0 and over, adult: Secondary | ICD-10-CM

## 2022-03-16 DIAGNOSIS — E1165 Type 2 diabetes mellitus with hyperglycemia: Secondary | ICD-10-CM | POA: Diagnosis not present

## 2022-03-16 LAB — COMPREHENSIVE METABOLIC PANEL
ALT: 18 U/L (ref 0–35)
AST: 15 U/L (ref 0–37)
Albumin: 4.3 g/dL (ref 3.5–5.2)
Alkaline Phosphatase: 53 U/L (ref 39–117)
BUN: 19 mg/dL (ref 6–23)
CO2: 28 mEq/L (ref 19–32)
Calcium: 9.6 mg/dL (ref 8.4–10.5)
Chloride: 100 mEq/L (ref 96–112)
Creatinine, Ser: 0.74 mg/dL (ref 0.40–1.20)
GFR: 87.11 mL/min (ref >60.00)
Glucose, Bld: 128 mg/dL — ABNORMAL HIGH (ref 70–99)
Potassium: 4 mEq/L (ref 3.5–5.1)
Sodium: 140 mEq/L (ref 135–145)
Total Bilirubin: 1 mg/dL (ref 0.2–1.2)
Total Protein: 6.8 g/dL (ref 6.0–8.3)

## 2022-03-16 LAB — MICROALBUMIN / CREATININE URINE RATIO
Creatinine,U: 141.5 mg/dL
Microalb Creat Ratio: 1.1 mg/g (ref 0.0–30.0)
Microalb, Ur: 1.6 mg/dL (ref 0.0–1.9)

## 2022-03-16 LAB — CBC WITH DIFFERENTIAL/PLATELET
Basophils Absolute: 0.1 10*3/uL (ref 0.0–0.1)
Basophils Relative: 0.7 % (ref 0.0–3.0)
Eosinophils Absolute: 0.1 10*3/uL (ref 0.0–0.7)
Eosinophils Relative: 1.5 % (ref 0.0–5.0)
HCT: 38.6 % (ref 36.0–46.0)
Hemoglobin: 12.8 g/dL (ref 12.0–15.0)
Lymphocytes Relative: 19.1 % (ref 12.0–46.0)
Lymphs Abs: 1.6 10*3/uL (ref 0.7–4.0)
MCHC: 33.1 g/dL (ref 30.0–36.0)
MCV: 94.3 fl (ref 78.0–100.0)
Monocytes Absolute: 0.6 10*3/uL (ref 0.1–1.0)
Monocytes Relative: 6.6 % (ref 3.0–12.0)
Neutro Abs: 6 10*3/uL (ref 1.4–7.7)
Neutrophils Relative %: 72.1 % (ref 43.0–77.0)
Platelets: 161 10*3/uL (ref 150.0–400.0)
RBC: 4.1 Mil/uL (ref 3.87–5.11)
RDW: 13 % (ref 11.5–15.5)
WBC: 8.3 10*3/uL (ref 4.0–10.5)

## 2022-03-16 LAB — LIPID PANEL
Cholesterol: 153 mg/dL (ref 0–200)
HDL: 52.5 mg/dL (ref >39.00)
LDL Cholesterol: 79 mg/dL (ref 0–99)
NonHDL: 100.5
Total CHOL/HDL Ratio: 3
Triglycerides: 107 mg/dL (ref 0.0–149.0)
VLDL: 21.4 mg/dL (ref 0.0–40.0)

## 2022-03-16 LAB — HEMOGLOBIN A1C: Hgb A1c MFr Bld: 6.2 % (ref 4.6–6.5)

## 2022-03-16 NOTE — Assessment & Plan Note (Signed)
Well controlled, no changes to meds. Encouraged heart healthy diet such as the DASH diet and exercise as tolerated.  °

## 2022-03-16 NOTE — Progress Notes (Addendum)
Subjective:   By signing my name below, I, Pam Phillips, attest that this documentation has been prepared under the direction and in the presence of [name] DO 03/16/2022     Patient ID: Pam Phillips, female    DOB: 05-29-1961, 61 y.o.   MRN: [id number]  Chief Complaint  Patient presents with   Diabetes   Hypertension   Hyperlipidemia   Follow-up    HPI Patient is in today for an office visit  Patient has been scheduled for a neurology study on 04/03/2022  She has been using 0.5 Mg of Lorazepram only as needed but has been going to therapy since last year .     She has been monitoring her blood sugar more regularly as of more recently. She has gotten numbers in 120 - 170 mg/dL range. She has been trying to continue to be active through swimming and trying to modify her diet.  Lab Results  Component Value Date   HGBA1C 5.8 08/15/2021   She has gotten busy and is not UTD on her colonoscopy routine check up.   She receive three doses of the COVID vaccine.   She plan to go to dermatologist, and optometrist for concerns about a mole on her retina that she keeps monitoring due to her melanoma. She checks her retina every two years  She plans to get back on track about routine visits. She plans to get her mammogram, pap smear, and opthalmology exam this year.  Past Medical History:  Diagnosis Date   Ankle swelling 07-04-12   mostly left ankle -wears compression hose occ.   Anxiety    Cancer ([place]) 07-04-12   dx. Melanoma in situ -back(mid-scapula area)   Heart murmur    Hyperlipidemia    Hypertension     Past Surgical History:  Procedure Laterality Date   CHOLECYSTECTOMY  07-04-12   '03-Lap. due to gallstones-[place]   COLONOSCOPY N/A 08/18/2013   Procedure: COLONOSCOPY;  Surgeon: Jerene Bears, MD;  Location: [facility];  Service: Gastroenterology;  Laterality: N/A;   DENTAL SURGERY  07-04-12   2 wisdom teeth and 2 other teeth   MELANOMA EXCISION   07/10/2012   Procedure: MELANOMA EXCISION;  Surgeon: Stark Klein, MD;  Location: [facility];  Service: General;  Laterality: N/A;  excision of back melanoma insitu    Family History  Problem Relation Age of Onset   Hypertension Father    COPD Father    Heart disease Father 38       MI   Cancer Father 90       prostate ca   Colon cancer Mother    Alzheimer's disease Mother    Coronary artery disease Other 35       1st degree female   Pancreatic cancer Neg Hx    Stomach cancer Neg Hx     Social History   Socioeconomic History   Marital status: Married    Spouse name: Not on file   Number of children: Not on file   Years of education: Not on file   Highest education level: Not on file  Occupational History   Occupation: Environmental consultant    Employer: [employer]  Tobacco Use   Smoking status: Never   Smokeless tobacco: Never  Substance and Sexual Activity   Alcohol use: Yes    Alcohol/week: 1.0 standard drink    Types: 1 Glasses of wine per week    Comment: r-occ. social 1 wine  per week   Drug use: No   Sexual activity: Yes    Partners: Male  Other Topics Concern   Not on file  Social History Narrative   Exercise-- not right now   Social Determinants of Health   Financial Resource Strain: Not on file  Food Insecurity: Not on file  Transportation Needs: Not on file  Physical Activity: Not on file  Stress: Not on file  Social Connections: Not on file  Intimate Partner Violence: Not on file    Outpatient Medications Prior to Visit  Medication Sig Dispense Refill   ACCU-CHEK GUIDE test strip TEST UP TO FOUR TIMES DAILY 100 strip 1   amLODipine (NORVASC) 2.5 MG tablet TAKE 1 TABLET(2.5 MG) BY MOUTH DAILY 90 tablet 1   aspirin 81 MG EC tablet Take 81 mg by mouth every morning.     benzonatate (TESSALON) 100 MG capsule Take 1 capsule (100 mg total) by mouth 3 (three) times daily as needed. 30 capsule 0   blood glucose meter kit and supplies KIT Dispense based  on patient and insurance preference. Use up to four times daily as directed. (FOR ICD-9 250.00, 250.01). 1 each 0   fish oil-omega-3 fatty acids 1000 MG capsule Take 1 g by mouth 2 (two) times daily.      Flaxseed, Linseed, (FLAX SEED OIL PO) Take by mouth.     FLUoxetine (PROZAC) 20 MG tablet TAKE 1 TABLET(20 MG) BY MOUTH DAILY 90 tablet 1   furosemide (LASIX) 20 MG tablet TAKE 1 TABLET(20 MG) BY MOUTH DAILY 30 tablet 3   GLUCOSAMINE-CHONDROITIN PO Take 1 capsule by mouth 2 (two) times daily.     ipratropium (ATROVENT) 0.03 % nasal spray Place 2 sprays into both nostrils every 12 (twelve) hours. 30 mL 0   LORazepam (ATIVAN) 0.5 MG tablet Take 1 tablet (0.5 mg total) by mouth every 8 (eight) hours as needed for anxiety. 90 tablet 0   lovastatin (MEVACOR) 40 MG tablet TAKE 1 TABLET(40 MG) BY MOUTH AT BEDTIME 90 tablet 1   metFORMIN (GLUCOPHAGE) 500 MG tablet TAKE 1 TABLET(500 MG) BY MOUTH DAILY WITH BREAKFAST 90 tablet 1   Multiple Vitamin (MULTIVITAMIN) capsule Take 1 capsule by mouth daily.     NYSTATIN powder APPLY EXTERNALLY TO THE AFFECTED AREA THREE TIMES DAILY AS NEEDED 60 g 5   olmesartan-hydrochlorothiazide (BENICAR HCT) 40-25 MG tablet TAKE 1 TABLET BY MOUTH DAILY 90 tablet 1   polyvinyl alcohol (LIQUIFILM TEARS) 1.4 % ophthalmic solution Place 1 drop into both eyes daily as needed (For dry eyes.).     tolterodine (DETROL) 2 MG tablet TAKE 1 TABLET(2 MG) BY MOUTH TWICE DAILY 180 tablet 3   No facility-administered medications prior to visit.    Allergies  Allergen Reactions   Other Swelling    Peaches(fresh) causes throat itching-swelling   Codeine     Nausea & vomiting   Penicillins     angioedema    Review of Systems  Constitutional:  Negative for fever and malaise/fatigue.  HENT:  Negative for congestion.   Eyes:  Negative for blurred vision.  Respiratory:  Negative for cough and shortness of breath.   Cardiovascular:  Negative for chest pain, palpitations and leg  swelling.  Gastrointestinal:  Negative for vomiting.  Musculoskeletal:  Negative for back pain.  Skin:  Negative for rash.  Neurological:  Negative for loss of consciousness and headaches.      Objective:    Physical Exam Vitals and nursing note  reviewed.  Constitutional:      General: She is not in acute distress.    Appearance: Normal appearance. She is not ill-appearing.  HENT:     Head: Normocephalic and atraumatic.     Right Ear: External ear normal.     Left Ear: External ear normal.  Eyes:     Extraocular Movements: Extraocular movements intact.     Pupils: Pupils are equal, round, and reactive to light.  Cardiovascular:     Rate and Rhythm: Normal rate and regular rhythm.     Heart sounds: Normal heart sounds. No murmur heard.   No gallop.  Pulmonary:     Effort: Pulmonary effort is normal. No respiratory distress.     Breath sounds: Normal breath sounds. No wheezing or rales.  Skin:    General: Skin is warm and dry.  Neurological:     Mental Status: She is alert and oriented to person, place, and time.  Psychiatric:        Judgment: Judgment normal.    BP 130/82 (BP Location: Right Arm, Patient Position: Sitting, Cuff Size: Large)   Pulse (!) 55   Temp 97.6 F (36.4 C) (Oral)   Resp 18   Ht _0  (1.727 m)   Wt (!) 375 lb (170.1 kg)   LMP 06/26/2012   SpO2 98%   BMI 57.02 kg/m  Wt Readings from Last 3 Encounters:  03/16/22 (!) 375 lb (170.1 kg)  08/15/21 (!) 375 lb 2 oz (170.2 kg)  02/01/21 (!) 374 lb 3.2 oz (169.7 kg)    Diabetic Foot Exam - Simple   No data filed    Lab Results  Component Value Date   WBC 6.5 08/15/2021   HGB 12.4 08/15/2021   HCT 37.3 08/15/2021   PLT 163.0 08/15/2021   GLUCOSE 119 (H) 08/15/2021   CHOL 137 08/15/2021   TRIG 67.0 08/15/2021   HDL 49.30 08/15/2021   LDLCALC 75 08/15/2021   ALT 16 08/15/2021   AST 14 08/15/2021   NA 141 08/15/2021   K 4.2 08/15/2021   CL 102 08/15/2021   CREATININE 0.66 08/15/2021    BUN 18 08/15/2021   CO2 31 08/15/2021   TSH 2.31 06/10/2019   HGBA1C 5.8 08/15/2021   MICROALBUR 2.5 (H) 02/01/2021    Lab Results  Component Value Date   TSH 2.31 06/10/2019   Lab Results  Component Value Date   WBC 6.5 08/15/2021   HGB 12.4 08/15/2021   HCT 37.3 08/15/2021   MCV 95.8 08/15/2021   PLT 163.0 08/15/2021   Lab Results  Component Value Date   NA 141 08/15/2021   K 4.2 08/15/2021   CO2 31 08/15/2021   GLUCOSE 119 (H) 08/15/2021   BUN 18 08/15/2021   CREATININE 0.66 08/15/2021   BILITOT 0.9 08/15/2021   ALKPHOS 60 08/15/2021   AST 14 08/15/2021   ALT 16 08/15/2021   PROT 6.7 08/15/2021   ALBUMIN 4.2 08/15/2021   CALCIUM 9.2 08/15/2021   GFR 94.84 08/15/2021   Lab Results  Component Value Date   CHOL 137 08/15/2021   Lab Results  Component Value Date   HDL 49.30 08/15/2021   Lab Results  Component Value Date   LDLCALC 75 08/15/2021   Lab Results  Component Value Date   TRIG 67.0 08/15/2021   Lab Results  Component Value Date   CHOLHDL 3 08/15/2021   Lab Results  Component Value Date   HGBA1C 5.8 08/15/2021  Assessment & Plan:   Problem List Items Addressed This Visit       Unprioritized   Morbid obesity with BMI of 50.0-59.9, adult ([place])    Pt has started exercising and will try to watch her eating        Hyperlipidemia LDL goal <100    Encourage heart healthy diet such as MIND or DASH diet, increase exercise, avoid trans fats, simple carbohydrates and processed foods, consider a krill or fish or flaxseed oil cap daily.        Essential hypertension    Well controlled, no changes to meds. Encouraged heart healthy diet such as the DASH diet and exercise as tolerated.        Diabetes mellitus type II, non insulin dependent ([place])    hgba1c to be checked , minimize simple carbs. Increase exercise as tolerated. Continue current meds       Other Visit Diagnoses     Type 2 diabetes mellitus with hyperglycemia, without  long-term current use of insulin ([place])    -  Primary   Relevant Orders   CBC with Differential/Platelet   Comprehensive metabolic panel   Lipid panel   Hemoglobin A1c   Microalbumin / creatinine urine ratio   Hyperlipidemia associated with type 2 diabetes mellitus ([place])       Relevant Orders   CBC with Differential/Platelet   Comprehensive metabolic panel   Lipid panel   Hemoglobin A1c   Microalbumin / creatinine urine ratio   Primary hypertension       Relevant Orders   CBC with Differential/Platelet   Comprehensive metabolic panel   Lipid panel   Hemoglobin A1c   Microalbumin / creatinine urine ratio   Colon cancer screening       Relevant Orders   Ambulatory referral to Gastroenterology         No orders of the defined types were placed in this encounter.   IAnn Held, DO, personally preformed the services described in this documentation.  All medical record entries made by the scribe were at my direction and in my presence.  I have reviewed the chart and discharge instructions (if applicable) and agree that the record reflects my personal performance and is accurate and complete. 03/16/2022   I,Amber Collins,acting as a scribe for [name], DO.,have documented all relevant documentation on the behalf of [name], DO,as directed by  [name], DO while in the presence of [name], DO.    [name], DO

## 2022-03-16 NOTE — Assessment & Plan Note (Signed)
Pt has started exercising and will try to watch her eating

## 2022-03-16 NOTE — Patient Instructions (Signed)

## 2022-03-16 NOTE — Assessment & Plan Note (Signed)
hgba1c to be checked, minimize simple carbs. Increase exercise as tolerated. Continue current meds  

## 2022-03-16 NOTE — Assessment & Plan Note (Signed)
Encourage heart healthy diet such as MIND or DASH diet, increase exercise, avoid trans fats, simple carbohydrates and processed foods, consider a krill or fish or flaxseed oil cap daily.  °

## 2022-03-29 ENCOUNTER — Other Ambulatory Visit: Payer: Self-pay

## 2022-03-29 MED ORDER — METFORMIN HCL 500 MG PO TABS: 500.0000 mg | ORAL_TABLET | Freq: Two times a day (BID) | ORAL | 2 refills | Status: DC

## 2022-03-30 ENCOUNTER — Encounter: Payer: Self-pay | Admitting: Family Medicine

## 2022-04-03 ENCOUNTER — Encounter: Payer: Self-pay | Admitting: Neurology

## 2022-04-03 ENCOUNTER — Ambulatory Visit: Payer: BC Managed Care – PPO | Admitting: Neurology

## 2022-04-03 VITALS — BP 157/88 | HR 61 | Ht 68.0 in | Wt 378.0 lb

## 2022-04-03 DIAGNOSIS — R351 Nocturia: Secondary | ICD-10-CM | POA: Diagnosis not present

## 2022-04-03 DIAGNOSIS — R0683 Snoring: Secondary | ICD-10-CM

## 2022-04-03 DIAGNOSIS — G4719 Other hypersomnia: Secondary | ICD-10-CM | POA: Diagnosis not present

## 2022-04-03 DIAGNOSIS — Z6841 Body Mass Index (BMI) 40.0 and over, adult: Secondary | ICD-10-CM

## 2022-04-03 DIAGNOSIS — R0681 Apnea, not elsewhere classified: Secondary | ICD-10-CM

## 2022-04-03 DIAGNOSIS — R600 Localized edema: Secondary | ICD-10-CM

## 2022-04-03 NOTE — Progress Notes (Addendum)
Subjective:    Patient ID: Pam Phillips is a 61 y.o. female.  HPI    Star Age, MD, PhD [facility] [address]O. [place], [place] [address]  Dear Dr. Carollee Herter,   I saw your patient, Pam Phillips, upon your kind request, in my sleep clinic today for initial consultation of her sleep disordered, in particular, concern for underlying obstructive sleep apnea.  The patient is unaccompanied today.  As you know, Pam Phillips is a 61 year old right-handed woman with an underlying medical history of melanoma, anxiety, hypertension, hyperlipidemia, and morbid obesity with a BMI of over 50, who reports snoring and excessive daytime somnolence.  Her husband has noted pauses in her breathing while she is asleep, he has sleep apnea and uses a machine, a BiPAP per patient report.  She has no family history of sleep apnea.  She has had worsening symptoms with regards to her daytime energy and daytime somnolence.  She attributed this to stress, she takes care of her husband and mother-in-law also lives with them, patient's mom is in a nursing care facility, and patient works full-time.  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  Bedtime is generally between 10 and 10:30 PM, rise time around 6:30 AM.  She has nocturia about once per average night, denies any recurrent nocturnal or morning headaches.  She denies telltale symptoms of restless leg syndrome or leg twitching at night.  She does have significant lower extremity swelling and takes furosemide and also uses compression socks, she tries to raise her legs up at night.  She has recently purchased an adjustable bed but it is not installed yet.  She drinks caffeine in the form of coffee, about 3 cups in the mornings.  She drinks alcohol up to 1 serving per day in the form of wine, she is a non-smoker.  She has no children, they have 1 dog and 1 cat in the household, the dog typically sleeps  on the bed with them.  They have a TV in the bedroom but typically do not watch it at night.  She was referred last year for sleep evaluation but could not make an appointment.  I reviewed your office note from 02/01/2021, as well as 03/16/2022.  Her Epworth sleepiness score is 14 out of 24, fatigue severity score is 53 out of 63.  Of note, before COVID, she was going regularly to weight watchers and was able to lose about 40 to 45 pounds.  She has had some weight gain and is currently working on weight loss.  Her Past Medical History Is Significant For: Past Medical History:  Diagnosis Date   Ankle swelling 07-04-12   mostly left ankle -wears compression hose occ.   Anxiety    Cancer ([place]) 07-04-12   dx. Melanoma in situ -back(mid-scapula area)   Heart murmur    Hyperlipidemia    Hypertension     Her Past Surgical History Is Significant For: Past Surgical History:  Procedure Laterality Date   CHOLECYSTECTOMY  07-04-12   '03-Lap. due to gallstones-[place]   COLONOSCOPY N/A 08/18/2013   Procedure: COLONOSCOPY;  Surgeon: Jerene Bears, MD;  Location: [facility];  Service: Gastroenterology;  Laterality: N/A;   DENTAL SURGERY  07-04-12   2 wisdom teeth and 2 other teeth   MELANOMA EXCISION  07/10/2012   Procedure: MELANOMA EXCISION;  Surgeon: Stark Klein, MD;  Location: [facility];  Service: General;  Laterality: N/A;  excision of back melanoma insitu    Her Family History Is Significant For: Family History  Problem Relation Age of Onset   Colon cancer Mother    Alzheimer's disease Mother    Hypertension Father    COPD Father    Heart disease Father 35       MI   Cancer Father 90       prostate ca   Coronary artery disease Other 28       1st degree female   Pancreatic cancer Neg Hx    Stomach cancer Neg Hx    Sleep apnea Neg Hx     Her Social History Is Significant For: Social History   Socioeconomic History   Marital status: Married    Spouse name: Not on file   Number of  children: Not on file   Years of education: Not on file   Highest education level: Not on file  Occupational History   Occupation: Environmental consultant    Employer: [employer]  Tobacco Use   Smoking status: Never   Smokeless tobacco: Never  Substance and Sexual Activity   Alcohol use: Yes    Alcohol/week: 4.0 standard drinks of alcohol    Types: 4 Glasses of wine per week    Comment: r-occ. social 1 wine per week   Drug use: No   Sexual activity: Yes    Partners: Male  Other Topics Concern   Not on file  Social History Narrative   Exercise-- not right now   Social Determinants of Health   Financial Resource Strain: Not on file  Food Insecurity: Not on file  Transportation Needs: Not on file  Physical Activity: Not on file  Stress: Not on file  Social Connections: Not on file    Her Allergies Are:  Allergies  Allergen Reactions   Other Swelling    Peaches(fresh) causes throat itching-swelling   Codeine     Nausea & vomiting   Penicillins     angioedema  :   Her Current Medications Are:  Outpatient Encounter Medications as of 04/03/2022  Medication Sig   ACCU-CHEK GUIDE test strip TEST UP TO FOUR TIMES DAILY   amLODipine (NORVASC) 2.5 MG tablet TAKE 1 TABLET(2.5 MG) BY MOUTH DAILY   aspirin 81 MG EC tablet Take 81 mg by mouth every morning.   blood glucose meter kit and supplies KIT Dispense based on patient and insurance preference. Use up to four times daily as directed. (FOR ICD-9 250.00, 250.01).   fish oil-omega-3 fatty acids 1000 MG capsule Take 1 g by mouth 2 (two) times daily.    Flaxseed, Linseed, (FLAX SEED OIL PO) Take by mouth.   FLUoxetine (PROZAC) 20 MG tablet TAKE 1 TABLET(20 MG) BY MOUTH DAILY   furosemide (LASIX) 20 MG tablet TAKE 1 TABLET(20 MG) BY MOUTH DAILY   GLUCOSAMINE-CHONDROITIN PO Take 1 capsule by mouth 2 (two) times daily.   LORazepam (ATIVAN) 0.5 MG tablet Take 1 tablet (0.5 mg total) by mouth every 8 (eight) hours as needed for  anxiety.   lovastatin (MEVACOR) 40 MG tablet TAKE 1 TABLET(40 MG) BY MOUTH AT BEDTIME   metFORMIN (GLUCOPHAGE) 500 MG tablet Take 1 tablet (500 mg total) by mouth 2 (two) times daily with a meal.   Multiple Vitamin (MULTIVITAMIN) capsule Take 1 capsule by mouth daily.   NYSTATIN powder APPLY EXTERNALLY TO THE AFFECTED AREA THREE TIMES DAILY AS NEEDED   olmesartan-hydrochlorothiazide (BENICAR HCT) 40-25 MG tablet TAKE 1 TABLET  BY MOUTH DAILY   polyvinyl alcohol (LIQUIFILM TEARS) 1.4 % ophthalmic solution Place 1 drop into both eyes daily as needed (For dry eyes.).   tolterodine (DETROL) 2 MG tablet TAKE 1 TABLET(2 MG) BY MOUTH TWICE DAILY   benzonatate (TESSALON) 100 MG capsule Take 1 capsule (100 mg total) by mouth 3 (three) times daily as needed.   ipratropium (ATROVENT) 0.03 % nasal spray Place 2 sprays into both nostrils every 12 (twelve) hours.   No facility-administered encounter medications on file as of 04/03/2022.  :   Review of Systems:  Out of a complete 14 point review of systems, all are reviewed and negative with the exception of these symptoms as listed below:   Review of Systems  Neurological:        Pt is here for Sleep Consult  Pt states she snores ,hypertension ,fatigue Pt denies headaches, sleep study, CPAP machine    ESS:14 FSS:53    Objective:  Neurological Exam  Physical Exam Physical Examination:   Vitals:   04/03/22 0852  BP: (!) 157/88  Pulse: 61    General Examination: The patient is a very pleasant 61 y.o. female in no acute distress. She appears well-developed and well-nourished and well groomed.   HEENT: Normocephalic, atraumatic, pupils are equal, round and reactive to light, extraocular tracking is good without limitation to gaze excursion or nystagmus noted. Hearing is grossly intact. Face is symmetric with normal facial animation. Speech is clear with no dysarthria noted. There is no hypophonia. There is no lip, neck/head, jaw or voice tremor.  Neck is supple with full range of passive and active motion. There are no carotid bruits on auscultation.  Small opening noted, minimal overbite.  Neck circumference of 17-3/8 inches.  Chest: Clear to auscultation without wheezing, rhonchi or crackles noted.  Heart: S1+S2+0, regular and normal with mild, 2/6 pansystolic murmur, no rubs or gallops noted.   Abdomen: Soft, non-tender and non-distended with normal bowel sounds appreciated on auscultation.  Extremities: There is 2+ pitting edema in the distal lower extremities bilaterally.  Mild varicose veins in the calves, mild spider veins around the ankles.  Skin: Warm and dry without trophic changes noted.   Musculoskeletal: exam reveals no obvious joint deformities.   Neurologically:  Mental status: The patient is awake, alert and oriented in all 4 spheres. Her immediate and remote memory, attention, language skills and fund of knowledge are appropriate. There is no evidence of aphasia, agnosia, apraxia or anomia. Speech is clear with normal prosody and enunciation. Thought process is linear. Mood is normal and affect is normal.  Cranial nerves II - XII are as described above under HEENT exam.  Motor exam: Normal bulk, strength and tone is noted. There is no obvious tremor.  Fine motor skills and coordination: grossly intact.  Cerebellar testing: No dysmetria or intention tremor. There is no truncal or gait ataxia.  Sensory exam: intact to light touch in the upper and lower extremities.  Gait, station and balance: She stands easily. No veering to one side is noted. No leaning to one side is noted. Posture is age-appropriate and stance is narrow based. Gait shows normal stride length and normal pace. No problems turning are noted.   Assessment and Plan:  In summary, Avonell Lenig is a very pleasant 61 y.o.-year old female with an underlying medical history of melanoma, anxiety, hypertension, hyperlipidemia, and morbid obesity with a  BMI of over 50, whose history and physical exam are concerning for obstructive sleep  apnea (OSA). I had a long chat with the patient about my findings and the diagnosis of OSA, its prognosis and treatment options. We talked about medical treatments, surgical interventions and non-pharmacological approaches. I explained in particular the risks and ramifications of untreated moderate to severe OSA, especially with respect to developing cardiovascular disease down the Road, including congestive heart failure, difficult to treat hypertension, cardiac arrhythmias, or stroke. Even type 2 diabetes has, in part, been linked to untreated OSA. Symptoms of untreated OSA include daytime sleepiness, memory problems, mood irritability and mood disorder such as depression and anxiety, lack of energy, as well as recurrent headaches, especially morning headaches. We talked about trying to maintain a healthy lifestyle in general, as well as the importance of weight control. We also talked about the importance of good sleep hygiene. I recommended the following at this time: sleep study.  I outlined the differences between a laboratory attended sleep study versus home sleep testing.  She would be willing to come in for a sleep study. I explained the sleep test procedure to the patient and also outlined possible surgical and non-surgical treatment options of OSA, including the use of a custom-made dental device (which would require a referral to a specialist dentist or oral surgeon), upper airway surgical options, such as traditional UPPP or a novel less invasive surgical option in the form of Inspire hypoglossal nerve stimulation (which would involve a referral to an ENT surgeon). I also explained the CPAP treatment option to the patient, who indicated that she would be willing to try PAP therapy, if the need arises.  I was able to show her a AutoPap model.  We will pick up our discussion after sleep testing and plan a follow-up in  sleep clinic accordingly.  I answered all her questions today and she was in agreement.   Thank you very much for allowing me to participate in the care of this nice patient. If I can be of any further assistance to you please do not hesitate to call me at [phone].  Sincerely,   Star Age, MD, PhD

## 2022-04-03 NOTE — Patient Instructions (Signed)

## 2022-04-04 ENCOUNTER — Telehealth: Payer: Self-pay | Admitting: Neurology

## 2022-04-04 NOTE — Telephone Encounter (Signed)
Patient returned my call she requested to be schedule at July 4th. She is scheduled at [facility] for 04/20/22 at 9 pm.

## 2022-04-04 NOTE — Telephone Encounter (Signed)
LVM for pt to call back to schedule  [employer] state no auth req  

## 2022-04-05 NOTE — Telephone Encounter (Signed)
FYI

## 2022-04-20 ENCOUNTER — Ambulatory Visit (INDEPENDENT_AMBULATORY_CARE_PROVIDER_SITE_OTHER): Payer: BC Managed Care – PPO | Admitting: Neurology

## 2022-04-20 DIAGNOSIS — R0683 Snoring: Secondary | ICD-10-CM

## 2022-04-20 DIAGNOSIS — G4719 Other hypersomnia: Secondary | ICD-10-CM

## 2022-04-20 DIAGNOSIS — R0681 Apnea, not elsewhere classified: Secondary | ICD-10-CM

## 2022-04-20 DIAGNOSIS — R600 Localized edema: Secondary | ICD-10-CM

## 2022-04-20 DIAGNOSIS — G4720 Circadian rhythm sleep disorder, unspecified type: Secondary | ICD-10-CM

## 2022-04-20 DIAGNOSIS — R351 Nocturia: Secondary | ICD-10-CM

## 2022-04-20 DIAGNOSIS — G4733 Obstructive sleep apnea (adult) (pediatric): Secondary | ICD-10-CM

## 2022-04-24 NOTE — Addendum Note (Signed)
Addended by: Star Age on: 04/24/2022 02:43 PM   Modules accepted: Orders

## 2022-04-24 NOTE — Procedures (Signed)
PATIENT'S NAME:  Pam Phillips, Pam Phillips DOB:      15-Feb-1961      MR#:    [id number]     DATE OF RECORDING: 04/20/2022 REFERRING M.D.:  Carollee Herter, MD Study Performed:   Baseline Polysomnogram HISTORY: 61 year old woman with a history of melanoma, anxiety, hypertension, hyperlipidemia, and morbid obesity with a BMI of over 50, who reports snoring and excessive daytime somnolence, as well as witnessed breathing pauses while asleep. The patient endorsed the Epworth Sleepiness Scale at 14 points. The patient's weight 378 pounds with a height of 68 (inches), resulting in a BMI of 57.1 kg/m2. The patient's neck circumference measured 17.3 inches.  CURRENT MEDICATIONS: Norvasc, Aspirin, fish oil, flax seed oil, Prozac, Lasix, Glucosamine-Chondroitin, Ativan, Mevacor, Glucophage, Multivitamin, Benicar HCT, Detrol, Tessalon, Atrovent, eye drops   PROCEDURE:  This is a multichannel digital polysomnogram utilizing the Somnostar 11.2 system.  Electrodes and sensors were applied and monitored per AASM Specifications.   EEG, EOG, Chin and Limb EMG, were sampled at 200 Hz.  ECG, Snore and Nasal Pressure, Thermal Airflow, Respiratory Effort, CPAP Flow and Pressure, Oximetry was sampled at 50 Hz. Digital video and audio were recorded.      BASELINE STUDY  Lights Out was at 22:05 and Lights On at 05:01.  Total recording time (TRT) was 416.5 minutes, with a total sleep time (TST) of 375.5 minutes.   The patient's sleep latency was 31 minutes.  REM latency was 125.5 minutes, which is mildly delayed. The sleep efficiency was 90.2 %.     SLEEP ARCHITECTURE: WASO (Wake after sleep onset) was 21.5 minutes with very little sleep fragmentation noted. There were 10.5 minutes in Stage N1, 251 minutes Stage N2, 14.5 minutes Stage N3 and 99.5 minutes in Stage REM.  The percentage of Stage N1 was 2.8%, Stage N2 was 66.8%, which is increased, Stage N3 was 3.9%, which is reduced, and Stage R (REM sleep) was 26.5%, which is mildly increased. The  arousals were noted as: 22 were spontaneous, 0 were associated with PLMs, 9 were associated with respiratory events.  RESPIRATORY ANALYSIS:  There were a total of 102 respiratory events:  48 obstructive apneas, 0 central apneas and 5 mixed apneas with a total of 53 apneas and an apnea index (AI) of 8.5 /hour. There were 49 hypopneas with a hypopnea index of 7.8 /hour. The patient also had 0 respiratory event related arousals (RERAs).      The total APNEA/HYPOPNEA INDEX (AHI) was 16.3/hour and the total RESPIRATORY DISTURBANCE INDEX was  16.3 /hour.  72 events occurred in REM sleep and 60 events in NREM. The REM AHI was  43.4 /hour, versus a non-REM AHI of 6.5. The patient spent 254 minutes of total sleep time in the supine position and 122 minutes in non-supine.. The supine AHI was 21.7 versus a non-supine AHI of 5.0.  OXYGEN SATURATION & C02:  The Wake baseline 02 saturation was 93%, with the lowest being 71%. Time spent below 89% saturation equaled 146 minutes.  PERIODIC LIMB MOVEMENTS: The patient had a total of 0 Periodic Limb Movements.  The Periodic Limb Movement (PLM) index was 0 and the PLM Arousal index was 0/hour.  Audio and video analysis did not show any abnormal or unusual movements, behaviors, phonations or vocalizations. The patient took no bathroom breaks. Snoring was noted, ranging from mild to loud. The EKG was in keeping with normal sinus rhythm (NSR).  Post-study, the patient indicated that sleep was better than usual.   IMPRESSION:  Obstructive Sleep Apnea (OSA) Dysfunctions associated with sleep stages or arousal from sleep  RECOMMENDATIONS:  This study demonstrates moderate to severe obstructive sleep apnea, with a total AHI of 16.3/hour, REM AHI of 43.4/hour, supine AHI of 21.7/hour and O2 nadir of 71% (during supine REM sleep). Treatment with positive airway pressure in the form of CPAP is recommended. This will require - ideally - a full night titration study to  optimize therapy settings, mask fit, monitoring of tolerance and of proper oxygen saturations. Other treatment options may be limited, and may include (generally speaking) surgical options in selected patients or the use of an oral appliance in certain patients. Concomitant weight loss is strongly recommended. Please note, that untreated obstructive sleep apnea may carry additional perioperative morbidity. Patients with significant obstructive sleep apnea should receive perioperative PAP therapy and the surgeons and particularly the anesthesiologist should be informed of the diagnosis and the severity of the sleep disordered breathing. This study shows mildly abnormal sleep stage percentages; these are nonspecific findings and per se do not signify an intrinsic sleep disorder or a cause for the patient's sleep-related symptoms. Causes include (but are not limited to) the first night effect of the sleep study, circadian rhythm disturbances, medication effect or an underlying mood disorder or medical problem.  The patient should be cautioned not to drive, work at heights, or operate dangerous or heavy equipment when tired or sleepy. Review and reiteration of good sleep hygiene measures should be pursued with any patient. The patient will be seen in follow-up in the sleep clinic at [facility] for discussion of the test results, symptom and treatment compliance review, further management strategies, etc. The patient and her referring provider will be notified of the test results.  I certify that I have reviewed the entire raw data recording prior to the issuance of this report in accordance with the Standards of Accreditation of the American Academy of Sleep Medicine (AASM)  Star Age, MD, PhD Diplomat, American Board of Neurology and Sleep Medicine (Neurology and Sleep Medicine)

## 2022-04-25 ENCOUNTER — Telehealth: Payer: Self-pay

## 2022-04-25 ENCOUNTER — Telehealth: Payer: Self-pay | Admitting: Neurology

## 2022-04-25 NOTE — Telephone Encounter (Signed)
LVM for pt to call back to schedule  [employer] state no auth req

## 2022-04-25 NOTE — Telephone Encounter (Signed)
I called pt. No answer, left a message asking pt to call me back.   

## 2022-04-25 NOTE — Telephone Encounter (Signed)
I called pt. I advised pt that Dr. Rexene Alberts reviewed their sleep study results and found that moderate/severe sleep apnea was seen and recommends that pt be treated with a cpap. Dr. Rexene Alberts recommends that pt return for a repeat sleep study in order to properly titrate the cpap and ensure a good mask fit. Pt is agreeable to returning for a titration study. I advised pt that our sleep lab will file with pt's insurance and call pt to schedule the sleep study when we hear back from the pt's insurance regarding coverage of this sleep study. Pt verbalized understanding of results. Pt had no questions at this time but was encouraged to call back if questions arise.

## 2022-04-25 NOTE — Telephone Encounter (Signed)
-----   Message from Star Age, MD sent at 04/24/2022  2:42 PM EDT ----- Patient referred by Dr. Carollee Herter, seen by me on 04/03/22, diagnostic PSG on 04/20/22.   Please call and notify the patient that the recent sleep study showed moderate to severe (during REM sleep) obstructive sleep apnea with significant oxygen desaturations during dream sleep, while sleeping on the back. I recommend treatment in the form of CPAP. This will require a repeat sleep study for proper titration and mask fitting and correct monitoring of the oxygen saturations. Please explain to patient. I have placed an order in the chart. Thanks.  Star Age, MD, PhD [facility])

## 2022-04-25 NOTE — Telephone Encounter (Signed)
CPAP Titration- [employer] state no auth req . Patient returned my call she is scheduled at [facility] for 05/05/22 at 9 pm. I also mailed a packet to the patient.

## 2022-05-05 ENCOUNTER — Ambulatory Visit (INDEPENDENT_AMBULATORY_CARE_PROVIDER_SITE_OTHER): Payer: BC Managed Care – PPO | Admitting: Neurology

## 2022-05-05 DIAGNOSIS — R600 Localized edema: Secondary | ICD-10-CM

## 2022-05-05 DIAGNOSIS — R351 Nocturia: Secondary | ICD-10-CM

## 2022-05-05 DIAGNOSIS — G4734 Idiopathic sleep related nonobstructive alveolar hypoventilation: Secondary | ICD-10-CM

## 2022-05-05 DIAGNOSIS — G4733 Obstructive sleep apnea (adult) (pediatric): Secondary | ICD-10-CM

## 2022-05-05 DIAGNOSIS — G4720 Circadian rhythm sleep disorder, unspecified type: Secondary | ICD-10-CM

## 2022-05-05 DIAGNOSIS — G4719 Other hypersomnia: Secondary | ICD-10-CM

## 2022-05-09 ENCOUNTER — Other Ambulatory Visit: Payer: Self-pay | Admitting: Family Medicine

## 2022-05-09 ENCOUNTER — Encounter: Payer: Self-pay | Admitting: Family Medicine

## 2022-05-09 DIAGNOSIS — E1165 Type 2 diabetes mellitus with hyperglycemia: Secondary | ICD-10-CM

## 2022-05-09 DIAGNOSIS — F411 Generalized anxiety disorder: Secondary | ICD-10-CM

## 2022-05-09 MED ORDER — TIRZEPATIDE 2.5 MG/0.5ML ~~LOC~~ SOAJ: 2.5000 mg | SUBCUTANEOUS | 0 refills | Status: DC

## 2022-05-09 MED ORDER — LORAZEPAM 0.5 MG PO TABS: 0.5000 mg | ORAL_TABLET | Freq: Three times a day (TID) | ORAL | 0 refills | Status: DC | PRN

## 2022-05-10 ENCOUNTER — Encounter: Payer: Self-pay | Admitting: Internal Medicine

## 2022-05-10 NOTE — Procedures (Signed)
PATIENT'S NAME:  Pam, Phillips DOB:      12-20-1960      MR#:    [id number]     DATE OF RECORDING: 05/05/2022 REFERRING M.D.:  Carollee Herter, MD Study Performed:   CPAP  Titration HISTORY: 61 year old woman with a history of melanoma, anxiety, hypertension, hyperlipidemia, and morbid obesity with a BMI of over 50, who presents for a full night titration study to treat her obstructive sleep apnea. Her baseline sleep study from 04/20/22 demonstrated moderate to severe obstructive sleep apnea, with a total AHI of 16.3/hour, REM AHI of 43.4/hour, supine AHI of 21.7/hour and O2 nadir of 71% (during supine REM sleep). The patient endorsed the Epworth Sleepiness Scale at 14 points. The patient's weight 378 pounds with a height of 68 (inches), resulting in a BMI of 57.1 kg/m2. The patient's neck circumference measured 17.4 inches.  CURRENT MEDICATIONS: Norvasc, Aspirin, fish oil, flax seed oil, Prozac, Lasix, Glucosamine-Chondroitin, Ativan, Mevacor, Glucophage, Multivitamin, Benicar HCT, Detrol, Tessalon, Atrovent, eye drops  PROCEDURE:  This is a multichannel digital polysomnogram utilizing the SomnoStar 11.2 system.  Electrodes and sensors were applied and monitored per AASM Specifications.   EEG, EOG, Chin and Limb EMG, were sampled at 200 Hz.  ECG, Snore and Nasal Pressure, Thermal Airflow, Respiratory Effort, CPAP Flow and Pressure, Oximetry was sampled at 50 Hz. Digital video and audio were recorded.      The patient was fitted with a small Vitera FFM from [employer]. CPAP was initiated at 5 cmH20 with heated humidity per AASM split night standards and pressure was advanced to 11 cmH20 because of hypopneas, apneas and desaturations. As she was noted to have lower baseline oxygen saturations at 87% on a PAP of 7 cm, she was started on supplemental oxygen at 1 lpm bleed-in with CPAP. At a PAP pressure of 11 cmH20, there was a reduction of the AHI to 0/hour with supine REM sleep achieved and O2 nadir briefly at 88%. After  turning off supplemental oxygen her baseline O2 drifted closer to 89% with CPAP of 11 cm. She may need supplemental oxygen at 1 lpm bleed-in with CPAP of 11 cm.   Lights Out was at 22:09 and Lights On at 05:46. Total recording time (TRT) was 457.5 minutes, with a total sleep time (TST) of 435 minutes. The patient's sleep latency was 13 minutes. REM latency was 73.5 minutes.  The sleep efficiency was 95.1 %.    SLEEP ARCHITECTURE: WASO (Wake after sleep onset)  was 9 minutes.  There were 9.5 minutes in Stage N1, 209.5 minutes Stage N2, 18.5 minutes Stage N3 and 197.5 minutes in Stage REM.  The percentage of Stage N1 was 2.2%, Stage N2 was 48.2%, which is normal, Stage N3 was 4.3% and Stage R (REM sleep) was 45.4%, which is markedly increased and in keeping with rebound. The arousals were noted as: 11 were spontaneous, 0 were associated with PLMs, 1 were associated with respiratory events.  RESPIRATORY ANALYSIS:  There was a total of 15 respiratory events: 0 obstructive apneas, 0 central apneas and 1 mixed apneas with a total of 1 apneas and an apnea index (AI) of .1 /hour. There were 14 hypopneas with a hypopnea index of 1.9/hour. The patient also had 0 respiratory event related arousals (RERAs).      The total APNEA/HYPOPNEA INDEX  (AHI) was 2.1 /hour and the total RESPIRATORY DISTURBANCE INDEX was 2.1 /hour  13 events occurred in REM sleep and 2 events in NREM. The REM AHI was  3.9 /hour versus a non-REM AHI of .5 /hour.  The patient spent 301 minutes of total sleep time in the supine position and 134 minutes in non-supine. The supine AHI was 3.0, versus a non-supine AHI of 0.0.  OXYGEN SATURATION & C02:  The baseline 02 saturation was 94%, with the lowest being 80%. Time spent below 89% saturation equaled 80 minutes.  PERIODIC LIMB MOVEMENTS:  The patient had a total of 0 Periodic Limb Movements. The Periodic Limb Movement (PLM) index was 0 and the PLM Arousal index was 0 /hour.  Audio and video  analysis did not show any abnormal or unusual movements, behaviors, phonations or vocalizations. The patient took no bathroom breaks. Snoring was reduced. The EKG was in keeping with normal sinus rhythm (NSR).  Post-study, the patient indicated that sleep was better than usual.   IMPRESSION:   Obstructive Sleep Apnea (OSA) Nocturnal hypoxemia Dysfunctions associated with sleep stages or arousal from sleep   RECOMMENDATIONS:   This study demonstrates resolution of the patient's obstructive sleep apnea with CPAP therapy. She did have lower oxygen saturations during REM sleep, primarily on lower pressures, but had fairly good resolution of her hypoxemia with CPAP of 11 cm. While she may need supplemental oxygen with CPAP therapy at home. I will recommend home CPAP therapy for now without oxygen supplementation. I recommend starting patient on home CPAP treatment at a pressure of 11 cm via small Viteral FFM with heated humidity. The patient will be advised to be fully compliant with PAP therapy to improve sleep related symptoms and decrease long term cardiovascular risks. The patient should be reminded, that it may take up to 3 months to get fully used to using PAP with all planned sleep. The earlier full compliance is achieved, the better long term compliance tends to be. Please note that untreated obstructive sleep apnea may carry additional perioperative morbidity. Patients with significant obstructive sleep apnea should receive perioperative PAP therapy and the surgeons and particularly the anesthesiologist should be informed of the diagnosis and the severity of the sleep disordered breathing. She may benefit from doing an overnight pulse oximetry test, once established on home CPAP therapy.  This study shows mildly abnormal sleep stage percentages; these are nonspecific findings and per se do not signify an intrinsic sleep disorder or a cause for the patient's sleep-related symptoms. Causes include  (but are not limited to) the first night effect of the sleep study, circadian rhythm disturbances, medication effect or an underlying mood disorder or medical problem.  The patient should be cautioned not to drive, work at heights, or operate dangerous or heavy equipment when tired or sleepy. Review and reiteration of good sleep hygiene measures should be pursued with any patient. The patient will be seen in follow-up in the sleep clinic at [facility] for discussion of the test results, symptom and treatment compliance review, further management strategies, etc. The patient and her referring provider will be notified of the test results.   I certify that I have reviewed the entire raw data recording prior to the issuance of this report in accordance with the Standards of Accreditation of the American Academy of Sleep Medicine (AASM)   Star Age, MD, PhD Diplomat, American Board of Neurology and Sleep Medicine (Neurology and Sleep Medicine)

## 2022-05-10 NOTE — Addendum Note (Signed)
Addended by: Star Age on: 05/10/2022 02:49 PM   Modules accepted: Orders

## 2022-05-11 ENCOUNTER — Encounter: Payer: Self-pay | Admitting: Neurology

## 2022-05-11 ENCOUNTER — Encounter: Payer: Self-pay | Admitting: *Deleted

## 2022-05-11 ENCOUNTER — Telehealth: Payer: Self-pay | Admitting: *Deleted

## 2022-05-11 NOTE — Telephone Encounter (Signed)
Message Received: Pam Moron, MD  P Gna-Pod 4 Results Patient referred by Dr. Carollee Herter, seen by me on 04/03/22, diagnostic PSG on 04/20/22. Patient had a CPAP titration study on 05/05/22.  Please call and inform patient that I have entered an order for treatment with positive airway pressure (PAP) treatment for obstructive sleep apnea (OSA). She did well during the latest sleep study with CPAP. We will, therefore, arrange for a machine for home use through a DME (durable medical equipment) company of her choice; and I will see the patient back in follow-up in about 10 weeks. Please also explain to the patient that I will be looking out for compliance data, which can be downloaded from the machine (stored on an SD card, that is inserted in the machine) or via remote access through a modem, that is built into the machine. At the time of the followup appointment we will discuss sleep study results and how it is going with PAP treatment at home. Please advise patient to bring Her machine at the time of the first FU visit, even though this is cumbersome. Bringing the machine for every visit after that will likely not be needed, but often helps for the first visit to troubleshoot if needed. Please re-enforce the importance of compliance with treatment and the need for [place] to monitor compliance data - often an insurance requirement and actually good feedback for the patient as far as how they are doing.  Also remind patient, that any interim PAP machine or mask issues should be first addressed with the DME company, as they can often help better with technical and mask fit issues. Please ask if patient has a preference regarding DME company.  She was give supplemental oxygen during the study. We will start home CPAP without O2 for now.  Please also make sure, the patient has a follow-up appointment with me in about 10 weeks from the setup date, thanks. May see one of our nurse practitioners if needed for  proper timing of the FU appointment.  Please fax or rout report to the referring provider. Thanks,   Star Age, MD, PhD  [facility])

## 2022-05-11 NOTE — Telephone Encounter (Signed)
-----   Message from Star Age, MD sent at 05/10/2022  2:48 PM EDT ----- Patient referred by Dr. Carollee Herter, seen by me on 04/03/22, diagnostic PSG on 04/20/22. Patient had a CPAP titration study on 05/05/22. Please call and inform patient that I have entered an order for treatment with positive airway pressure (PAP) treatment for obstructive sleep apnea (OSA). She did well during the latest sleep study with CPAP. We will, therefore, arrange for a machine for home use through a DME (durable medical equipment) company of her choice; and I will see the patient back in follow-up in about 10 weeks. Please also explain to the patient that I will be looking out for compliance data, which can be downloaded from the machine (stored on an SD card, that is inserted in the machine) or via remote access through a modem, that is built into the machine. At the time of the followup appointment we will discuss sleep study results and how it is going with PAP treatment at home. Please advise patient to bring Her machine at the time of the first FU visit, even though this is cumbersome. Bringing the machine for every visit after that will likely not be needed, but often helps for the first visit to troubleshoot if needed. Please re-enforce the importance of compliance with treatment and the need for [place] to monitor compliance data - often an insurance requirement and actually good feedback for the patient as far as how they are doing.  Also remind patient, that any interim PAP machine or mask issues should be first addressed with the DME company, as they can often help better with technical and mask fit issues. Please ask if patient has a preference regarding DME company. She was give supplemental oxygen during the study. We will start home CPAP without O2 for now.  Please also make sure, the patient has a follow-up appointment with me in about 10 weeks from the setup date, thanks. May see one of our nurse practitioners if needed for  proper timing of the FU appointment.  Please fax or rout report to the referring provider. Thanks,   Star Age, MD, PhD [facility])

## 2022-05-11 NOTE — Telephone Encounter (Signed)
Attempted to reach pt to discuss sleep study results. LVM (ok per DPR) with office number asking for call back.

## 2022-05-11 NOTE — Telephone Encounter (Signed)
I called pt. I advised pt that Dr. Tressia Miners reviewed their sleep study results / cpap titration study results and found that pt has osa. Dr. Rexene Alberts recommends that pt start CPAP. I reviewed PAP compliance expectations with the pt. Pt is agreeable to starting a CPAP. I advised pt that an order will be sent to a DME, [employer], and they will call the pt within about one week after they file with the pt's insurance. [employer] will show the pt how to use the machine, fit for masks, and troubleshoot the CPAP if needed. A follow up appt was made for insurance purposes with Dr. Rexene Alberts on 07-20-2022 at [place]. Pt verbalized understanding to arrive 15 minutes early and bring their CPAP. A letter with all of this information in it will be mailed to the pt as a reminder. I verified with the pt that the address we have on file is correct. Pt verbalized understanding of results. Pt had no questions at this time but was encouraged to call back if questions arise. I have sent the order to [facility] and have received confirmation that they have received the order.

## 2022-05-15 ENCOUNTER — Telehealth (INDEPENDENT_AMBULATORY_CARE_PROVIDER_SITE_OTHER): Payer: BC Managed Care – PPO | Admitting: Family Medicine

## 2022-05-15 DIAGNOSIS — E119 Type 2 diabetes mellitus without complications: Secondary | ICD-10-CM

## 2022-05-15 DIAGNOSIS — Z6841 Body Mass Index (BMI) 40.0 and over, adult: Secondary | ICD-10-CM

## 2022-05-15 DIAGNOSIS — E1165 Type 2 diabetes mellitus with hyperglycemia: Secondary | ICD-10-CM

## 2022-05-15 MED ORDER — TIRZEPATIDE 2.5 MG/0.5ML ~~LOC~~ SOAJ: 2.5000 mg | SUBCUTANEOUS | 0 refills | Status: DC

## 2022-05-15 NOTE — Progress Notes (Signed)
MyChart Video Visit    Virtual Visit via Video Note   This visit type was conducted due to national recommendations for restrictions regarding the COVID-19 Pandemic (e.g. social distancing) in an effort to limit this patient's exposure and mitigate transmission in our community. This patient is at least at moderate risk for complications without adequate follow up. This format is felt to be most appropriate for this patient at this time. Physical exam was limited by quality of the video and audio technology used for the visit. Pam Phillips was able to get the patient set up on a video visit.  Patient location: Home Patient and provider in visit Provider location: Office  I discussed the limitations of evaluation and management by telemedicine and the availability of in person appointments. The patient expressed understanding and agreed to proceed.  Visit Date: 05/15/2022  Today's healthcare provider: Donato Schultz, DO     Subjective:    Patient ID: Pam Phillips, female    DOB: 05-Mar-1961, 61 y.o.   MRN: [id number]  Chief Complaint  Patient presents with   Obesity    HPI Patient is in today for a virtual office visit  She states that it was talked about Wegovy during last visit. Her insurance at the time would not cover Wegovy but suggests that Pam Phillips would be covered. However, the medication requires prior authorization and is requesting this authorization to try the medication.   Past Medical History:  Diagnosis Date   Ankle swelling 07-04-12   mostly left ankle -wears compression hose occ.   Anxiety    Cancer (HCC) 07-04-12   dx. Melanoma in situ -back(mid-scapula area)   Heart murmur    Hyperlipidemia    Hypertension     Past Surgical History:  Procedure Laterality Date   CHOLECYSTECTOMY  07-04-12   '03-Lap. due to gallstones-[place]   COLONOSCOPY N/A 08/18/2013   Procedure: COLONOSCOPY;  Surgeon: Pam Fiedler, MD;  Location: [facility]  ENDOSCOPY;  Service: Gastroenterology;  Laterality: N/A;   DENTAL SURGERY  07-04-12   2 wisdom teeth and 2 other teeth   MELANOMA EXCISION  07/10/2012   Procedure: MELANOMA EXCISION;  Surgeon: Pam Lint, MD;  Location: [facility] ORS;  Service: General;  Laterality: N/A;  excision of back melanoma insitu    Family History  Problem Relation Age of Onset   Colon cancer Mother    Alzheimer's disease Mother    Hypertension Father    COPD Father    Heart disease Father 44       MI   Cancer Father 32       prostate ca   Coronary artery disease Other 72       1st degree female   Pancreatic cancer Neg Hx    Stomach cancer Neg Hx    Sleep apnea Neg Hx     Social History   Socioeconomic History   Marital status: Married    Spouse name: Not on file   Number of children: Not on file   Years of education: Not on file   Highest education level: Not on file  Occupational History   Occupation: Neurosurgeon    Employer: [employer]  Tobacco Use   Smoking status: Never   Smokeless tobacco: Never  Substance and Sexual Activity   Alcohol use: Yes    Alcohol/week: 4.0 standard drinks of alcohol    Types: 4 Glasses of wine per week    Comment: r-occ. social 1 wine  per week   Drug use: No   Sexual activity: Yes    Partners: Male  Other Topics Concern   Not on file  Social History Narrative   Exercise-- not right now   Social Determinants of Health   Financial Resource Strain: Not on file  Food Insecurity: Not on file  Transportation Needs: Not on file  Physical Activity: Not on file  Stress: Not on file  Social Connections: Not on file  Intimate Partner Violence: Not on file    Outpatient Medications Prior to Visit  Medication Sig Dispense Refill   ACCU-CHEK GUIDE test strip TEST UP TO FOUR TIMES DAILY 100 strip 1   amLODipine (NORVASC) 2.5 MG tablet TAKE 1 TABLET(2.5 MG) BY MOUTH DAILY 90 tablet 1   aspirin 81 MG EC tablet Take 81 mg by mouth every morning.     blood  glucose meter kit and supplies KIT Dispense based on patient and insurance preference. Use up to four times daily as directed. (FOR ICD-9 250.00, 250.01). 1 each 0   fish oil-omega-3 fatty acids 1000 MG capsule Take 1 g by mouth 2 (two) times daily.      Flaxseed, Linseed, (FLAX SEED OIL PO) Take by mouth.     FLUoxetine (PROZAC) 20 MG tablet TAKE 1 TABLET(20 MG) BY MOUTH DAILY 90 tablet 1   furosemide (LASIX) 20 MG tablet TAKE 1 TABLET(20 MG) BY MOUTH DAILY 30 tablet 3   GLUCOSAMINE-CHONDROITIN PO Take 1 capsule by mouth 2 (two) times daily.     LORazepam (ATIVAN) 0.5 MG tablet Take 1 tablet (0.5 mg total) by mouth every 8 (eight) hours as needed for anxiety. 90 tablet 0   lovastatin (MEVACOR) 40 MG tablet TAKE 1 TABLET(40 MG) BY MOUTH AT BEDTIME 90 tablet 1   metFORMIN (GLUCOPHAGE) 500 MG tablet Take 1 tablet (500 mg total) by mouth 2 (two) times daily with a meal. 60 tablet 2   Multiple Vitamin (MULTIVITAMIN) capsule Take 1 capsule by mouth daily.     NYSTATIN powder APPLY EXTERNALLY TO THE AFFECTED AREA THREE TIMES DAILY AS NEEDED 60 g 5   olmesartan-hydrochlorothiazide (BENICAR HCT) 40-25 MG tablet TAKE 1 TABLET BY MOUTH DAILY 90 tablet 1   polyvinyl alcohol (LIQUIFILM TEARS) 1.4 % ophthalmic solution Place 1 drop into both eyes daily as needed (For dry eyes.).     tolterodine (DETROL) 2 MG tablet TAKE 1 TABLET(2 MG) BY MOUTH TWICE DAILY 180 tablet 3   tirzepatide (MOUNJARO) 2.5 MG/0.5ML Pen Inject 2.5 mg into the skin once a week. 2 mL 0   benzonatate (TESSALON) 100 MG capsule Take 1 capsule (100 mg total) by mouth 3 (three) times daily as needed. 30 capsule 0   ipratropium (ATROVENT) 0.03 % nasal spray Place 2 sprays into both nostrils every 12 (twelve) hours. 30 mL 0   No facility-administered medications prior to visit.    Allergies  Allergen Reactions   Other Swelling    Peaches(fresh) causes throat itching-swelling   Codeine     Nausea & vomiting   Penicillins     angioedema     Review of Systems  Constitutional:  Negative for fever and malaise/fatigue.  HENT:  Negative for congestion.   Eyes:  Negative for blurred vision.  Respiratory:  Negative for cough and shortness of breath.   Cardiovascular:  Negative for chest pain, palpitations and leg swelling.  Gastrointestinal:  Negative for vomiting.  Musculoskeletal:  Negative for back pain.  Skin:  Negative for rash.  Neurological:  Negative for loss of consciousness and headaches.       Objective:    Physical Exam Vitals and nursing note reviewed.  Constitutional:      Appearance: She is well-developed.  Eyes:     Conjunctiva/sclera: Conjunctivae normal.  Neck:     Thyroid: No thyromegaly.     Vascular: No carotid bruit or JVD.  Musculoskeletal:     Cervical back: Normal range of motion and neck supple.  Neurological:     Mental Status: She is alert and oriented to person, place, and time.     LMP 06/26/2012  Wt Readings from Last 3 Encounters:  04/03/22 (!) 378 lb (171.5 kg)  03/16/22 (!) 375 lb (170.1 kg)  08/15/21 (!) 375 lb 2 oz (170.2 kg)    Diabetic Foot Exam - Simple   No data filed    Lab Results  Component Value Date   WBC 8.3 03/16/2022   HGB 12.8 03/16/2022   HCT 38.6 03/16/2022   PLT 161.0 03/16/2022   GLUCOSE 128 (H) 03/16/2022   CHOL 153 03/16/2022   TRIG 107.0 03/16/2022   HDL 52.50 03/16/2022   LDLCALC 79 03/16/2022   ALT 18 03/16/2022   AST 15 03/16/2022   NA 140 03/16/2022   K 4.0 03/16/2022   CL 100 03/16/2022   CREATININE 0.74 03/16/2022   BUN 19 03/16/2022   CO2 28 03/16/2022   TSH 2.31 06/10/2019   HGBA1C 6.2 03/16/2022   MICROALBUR 1.6 03/16/2022    Lab Results  Component Value Date   TSH 2.31 06/10/2019   Lab Results  Component Value Date   WBC 8.3 03/16/2022   HGB 12.8 03/16/2022   HCT 38.6 03/16/2022   MCV 94.3 03/16/2022   PLT 161.0 03/16/2022   Lab Results  Component Value Date   NA 140 03/16/2022   K 4.0 03/16/2022   CO2 28  03/16/2022   GLUCOSE 128 (H) 03/16/2022   BUN 19 03/16/2022   CREATININE 0.74 03/16/2022   BILITOT 1.0 03/16/2022   ALKPHOS 53 03/16/2022   AST 15 03/16/2022   ALT 18 03/16/2022   PROT 6.8 03/16/2022   ALBUMIN 4.3 03/16/2022   CALCIUM 9.6 03/16/2022   GFR 87.11 03/16/2022   Lab Results  Component Value Date   CHOL 153 03/16/2022   Lab Results  Component Value Date   HDL 52.50 03/16/2022   Lab Results  Component Value Date   LDLCALC 79 03/16/2022   Lab Results  Component Value Date   TRIG 107.0 03/16/2022   Lab Results  Component Value Date   CHOLHDL 3 03/16/2022   Lab Results  Component Value Date   HGBA1C 6.2 03/16/2022       Assessment & Plan:   Problem List Items Addressed This Visit       Unprioritized   Morbid obesity with BMI of 50.0-59.9, adult ([place]) - Primary    mounjaro not covered for dm  ozempic sent in      Relevant Medications   tirzepatide (MOUNJARO) 2.5 MG/0.5ML Pen   Diabetes mellitus type II, non insulin dependent (HCC)    mounjaro was not covered  ozempic to be started       Relevant Medications   tirzepatide (MOUNJARO) 2.5 MG/0.5ML Pen   Other Visit Diagnoses     Type 2 diabetes mellitus with hyperglycemia, without long-term current use of insulin (HCC)       Relevant Medications   tirzepatide (MOUNJARO) 2.5 MG/0.5ML Pen  Meds ordered this encounter  Medications   tirzepatide (MOUNJARO) 2.5 MG/0.5ML Pen    Sig: Inject 2.5 mg into the skin once a week.    Dispense:  2 mL    Refill:  0    I discussed the assessment and treatment plan with the patient. The patient was provided an opportunity to ask questions and all were answered. The patient agreed with the plan and demonstrated an understanding of the instructions.   The patient was advised to call back or seek an in-person evaluation if the symptoms worsen or if the condition fails to improve as anticipated.  I provided 20 minutes of face-to-face time during  this encounter.   I,Amber Collins,acting as a Education administrator for [employer], DO.,have documented all relevant documentation on the behalf of [name], DO,as directed by  [name], DO while in the presence of [name], DO.   [name], DO [place] [facility] [employer] [phone] (phone) [phone] (fax)  [place]

## 2022-05-15 NOTE — Patient Instructions (Signed)
Obesity, Adult Obesity is the condition of having too much total body fat. Being overweight or obese means that your weight is greater than what is considered healthy for your body size. Obesity is determined by a measurement called BMI (body mass index). BMI is an estimate of body fat and is calculated from height and weight. For adults, a BMI of 30 or higher is considered obese. Obesity can lead to other health concerns and major illnesses, including: Stroke. Coronary artery disease (CAD). Type 2 diabetes. Some types of cancer, including cancers of the colon, breast, uterus, and gallbladder. High blood pressure (hypertension). High cholesterol. Gallbladder stones. Obesity can also contribute to: Osteoarthritis. Sleep apnea. Infertility problems. What are the causes? Common causes of this condition include: Eating daily meals that are high in calories, sugar, and fat. Drinking high amounts of sugar-sweetened beverages, such as soft drinks. Being born with genes that may make you more likely to become obese. Having a medical condition that causes obesity, including: Hypothyroidism. Polycystic ovarian syndrome (PCOS). Binge-eating disorder. Cushing syndrome. Taking certain medicines, such as steroids, antidepressants, and seizure medicines. Not being physically active (sedentary lifestyle). Not getting enough sleep. What increases the risk? The following factors may make you more likely to develop this condition: Having a family history of obesity. Living in an area with limited access to: Parks, recreation centers, or sidewalks. Healthy food choices, such as grocery stores and farmers' markets. What are the signs or symptoms? The main sign of this condition is having too much body fat. How is this diagnosed? This condition is diagnosed based on: Your BMI. If you are an adult with a BMI of 30 or higher, you are considered obese. Your waist circumference. This measures the  distance around your waistline. Your skinfold thickness. Your health care provider may gently pinch a fold of your skin and measure it. You may have other tests to check for underlying conditions. How is this treated? Treatment for this condition often includes changing your lifestyle. Treatment may include some or all of the following: Dietary changes. This may include developing a healthy meal plan. Regular physical activity. This may include activity that causes your heart to beat faster (aerobic exercise) and strength training. Work with your health care provider to design an exercise program that works for you. Medicine to help you lose weight if you are unable to lose one pound a week after six weeks of healthy eating and more physical activity. Treating conditions that cause the obesity (underlying conditions). Surgery. Surgical options may include gastric banding and gastric bypass. Surgery may be done if: Other treatments have not helped to improve your condition. You have a BMI of 40 or higher. You have life-threatening health problems related to obesity. Follow these instructions at home: Eating and drinking  Follow recommendations from your health care provider about what you eat and drink. Your health care provider may advise you to: Limit fast food, sweets, and processed snack foods. Choose low-fat options, such as low-fat milk instead of whole milk. Eat five or more servings of fruits or vegetables every day. Choose healthy foods when you eat out. Keep low-fat snacks available. Limit sugary drinks, such as soda, fruit juice, sweetened iced tea, and flavored milk. Drink enough water to keep your urine pale yellow. Do not follow a fad diet. Fad diets can be unhealthy and even dangerous. Other healthful choices include: Eat at home more often. This gives you more control over what you eat. Learn to read food labels.   This will help you understand how much food is considered one  serving. Learn what a healthy serving size is. Physical activity Exercise regularly, as told by your health care provider. Most adults should get up to 150 minutes of moderate-intensity exercise every week. Ask your health care provider what types of exercise are safe for you and how often you should exercise. Warm up and stretch before being active. Cool down and stretch after being active. Rest between periods of activity. Lifestyle Work with your health care provider and a dietitian to set a weight-loss goal that is healthy and reasonable for you. Limit your screen time. Find ways to reward yourself that do not involve food. Do not drink alcohol if: Your health care provider tells you not to drink. You are pregnant, may be pregnant, or are planning to become pregnant. If you drink alcohol: Limit how much you have to: 0-1 drink a day for women. 0-2 drinks a day for men. Know how much alcohol is in your drink. In the [place] one drink equals one 12 oz bottle of beer (355 mL), one 5 oz glass of wine (148 mL), or one 1 oz glass of hard liquor (44 mL). General instructions Keep a weight-loss journal to keep track of the food you eat and how much exercise you get. Take over-the-counter and prescription medicines only as told by your health care provider. Take vitamins and supplements only as told by your health care provider. Consider joining a support group. Your health care provider may be able to recommend a support group. Pay attention to your mental health as obesity can lead to depression or self esteem issues. Keep all follow-up visits. This is important. Contact a health care provider if: You are unable to meet your weight-loss goal after six weeks of dietary and lifestyle changes. You have trouble breathing. Summary Obesity is the condition of having too much total body fat. Being overweight or obese means that your weight is greater than what is considered healthy for your body  size. Work with your health care provider and a dietitian to set a weight-loss goal that is healthy and reasonable for you. Exercise regularly, as told by your health care provider. Ask your health care provider what types of exercise are safe for you and how often you should exercise. This information is not intended to replace advice given to you by your health care provider. Make sure you discuss any questions you have with your health care provider. Document Revised: 05/10/2021 Document Reviewed: 05/10/2021 Elsevier Patient Education  2023 Elsevier Inc.  

## 2022-05-16 ENCOUNTER — Encounter: Payer: Self-pay | Admitting: Family Medicine

## 2022-05-16 ENCOUNTER — Telehealth: Payer: Self-pay

## 2022-05-16 DIAGNOSIS — G4730 Sleep apnea, unspecified: Secondary | ICD-10-CM | POA: Insufficient documentation

## 2022-05-16 NOTE — Telephone Encounter (Signed)
PA initiated via Covermymeds; KEY: [id number]. Awaiting determination.

## 2022-05-16 NOTE — Assessment & Plan Note (Signed)
mounjaro not covered for dm  ozempic sent in

## 2022-05-16 NOTE — Telephone Encounter (Signed)
PA denied. Preferred alternatives: Ozempic, Rybelsus, Trulicity, Victoza

## 2022-05-16 NOTE — Assessment & Plan Note (Signed)
mounjaro was not covered  ozempic to be started

## 2022-05-17 ENCOUNTER — Encounter: Payer: Self-pay | Admitting: Family Medicine

## 2022-05-17 MED ORDER — OZEMPIC (0.25 OR 0.5 MG/DOSE) 2 MG/3ML ~~LOC~~ SOPN: PEN_INJECTOR | SUBCUTANEOUS | 3 refills | Status: DC

## 2022-05-17 NOTE — Telephone Encounter (Signed)
Pt made aware to change to Ozempic (0.'25mg'$  once weekly for 1 month, then 0.'5mg'$  weekly). Rx sent to [facility].

## 2022-05-21 ENCOUNTER — Other Ambulatory Visit: Payer: Self-pay | Admitting: Family Medicine

## 2022-05-21 DIAGNOSIS — R609 Edema, unspecified: Secondary | ICD-10-CM

## 2022-05-21 DIAGNOSIS — E785 Hyperlipidemia, unspecified: Secondary | ICD-10-CM

## 2022-05-22 ENCOUNTER — Encounter: Payer: Self-pay | Admitting: Family Medicine

## 2022-05-22 DIAGNOSIS — E1165 Type 2 diabetes mellitus with hyperglycemia: Secondary | ICD-10-CM

## 2022-05-23 MED ORDER — BLOOD GLUCOSE MONITOR KIT: PACK | 0 refills | Status: AC

## 2022-05-27 ENCOUNTER — Other Ambulatory Visit: Payer: Self-pay | Admitting: Family Medicine

## 2022-05-27 DIAGNOSIS — I10 Essential (primary) hypertension: Secondary | ICD-10-CM

## 2022-05-31 ENCOUNTER — Telehealth: Payer: Self-pay

## 2022-05-31 NOTE — Telephone Encounter (Signed)
Colon scheduled for 9/25 for [place]. BMI 57.49 04/03/22. Last Colon was at [facility] 08/18/13.  Would you like an OV or direct at [facility]? Thanks, Aaron Edelman (PV)

## 2022-06-01 NOTE — Telephone Encounter (Signed)
Okay for direct colonoscopy at [facility] for surveillance given history of adenomatous colon polyps

## 2022-06-01 NOTE — Telephone Encounter (Signed)
Vaughan Basta, could you please set this up?  Thanks, Aaron Edelman (PV)

## 2022-06-02 ENCOUNTER — Other Ambulatory Visit: Payer: Self-pay

## 2022-06-02 DIAGNOSIS — D126 Benign neoplasm of colon, unspecified: Secondary | ICD-10-CM

## 2022-06-02 NOTE — Telephone Encounter (Signed)
Pt scheduled for colon with Dr. Hilarie Fredrickson at [facility] 07/20/22 at 11:15am.

## 2022-06-15 ENCOUNTER — Telehealth: Payer: Self-pay | Admitting: Internal Medicine

## 2022-06-15 NOTE — Telephone Encounter (Signed)
Inbound call from patient calling to reschedule hospital procedure for 10/5. Patient states she has another apt that she has to arrive for same day for insurance purposes . Advised patient that the appointment will be a few months out if rescheduled . Patient advised understanding.  Thank you

## 2022-06-16 ENCOUNTER — Ambulatory Visit (AMBULATORY_SURGERY_CENTER): Payer: Self-pay

## 2022-06-16 ENCOUNTER — Telehealth: Payer: Self-pay

## 2022-06-16 VITALS — Ht 68.0 in | Wt 361.0 lb

## 2022-06-16 DIAGNOSIS — Z86010 Personal history of colonic polyps: Secondary | ICD-10-CM

## 2022-06-16 NOTE — Progress Notes (Signed)
PV today for upcoming colonoscopy at [facility] 10/5.  She states she only learned of the date change yesterday and will be unable to attend. She understands this may delay her procedure 1-43month due to the busy hospital schedule and she is agreeable to that.   Next PV may be done over the phone. She reports no recent changes in health status.

## 2022-06-16 NOTE — Telephone Encounter (Signed)
Pt here for PV today and reports she needs to reschedule her colonoscopy at [facility], currently scheduled for 07/20/22.   PV was not completed as pt expects next appointment to be 1-2 months later. (She is a good candidate for telephone PV when new appointment is set.) Thanks, Aaron Edelman ([place])

## 2022-06-20 NOTE — Telephone Encounter (Signed)
Pt scheduled for a telephone previsit 08/21/22 at 11am, colon rescheduled at [facility] for 09/11/22 at 8:30am. Pt aware of appts.

## 2022-06-21 ENCOUNTER — Telehealth: Payer: Self-pay | Admitting: Neurology

## 2022-06-21 NOTE — Telephone Encounter (Signed)
LVM and sent mychart msg asking pt to call back to reschedule 10/5 appointment - MD out

## 2022-07-10 ENCOUNTER — Encounter: Payer: BC Managed Care – PPO | Admitting: Internal Medicine

## 2022-07-11 ENCOUNTER — Encounter: Payer: Self-pay | Admitting: Neurology

## 2022-07-17 ENCOUNTER — Ambulatory Visit: Payer: BC Managed Care – PPO | Admitting: Neurology

## 2022-07-17 ENCOUNTER — Encounter: Payer: Self-pay | Admitting: Neurology

## 2022-07-17 VITALS — BP 157/77 | HR 57 | Ht 68.5 in | Wt 366.8 lb

## 2022-07-17 DIAGNOSIS — G4733 Obstructive sleep apnea (adult) (pediatric): Secondary | ICD-10-CM

## 2022-07-17 DIAGNOSIS — G4734 Idiopathic sleep related nonobstructive alveolar hypoventilation: Secondary | ICD-10-CM

## 2022-07-17 NOTE — Progress Notes (Addendum)
Subjective:    Patient ID: Pam Phillips is a 61 y.o. female.  HPI    Interim history:   Pam Phillips is a 61 year old right-handed woman with an underlying medical history of melanoma, anxiety, hypertension, hyperlipidemia, and morbid obesity with a BMI of over 36, who presents for follow-up consultation of Pam Phillips obstructive sleep apnea after interim testing and starting CPAP therapy at home.  The patient is unaccompanied today.  I first met Pam Phillips at the request of Pam Phillips primary care physician on 04/03/2022, at which time the patient reported snoring and excessive daytime somnolence as well as witnessed apneas.  She was encouraged to proceed with sleep testing.  She had a baseline sleep study, followed by a CPAP titration study.  She had a baseline sleep study on 04/20/22 which showed moderate to severe obstructive sleep apnea, with a total AHI of 16.3/hour, REM AHI of 43.4/hour, supine AHI of 21.7/hour and O2 nadir of 71% (during supine REM sleep).   She had a subsequent CPAP titration study on 05/05/2022, at which time she was to titrated from 7 cm to 11 cm.  She was tried on supplemental oxygen with CPAP briefly but at the end of the study it was discontinued, Pam Phillips baseline oxygen saturations drifted closer to 89% with a CPAP of 11 cm without supplemental oxygen, a fullface mask size small from [employer] was used for the study.  Based on Pam Phillips test results I prescribed home CPAP therapy at a pressure of 11 cm without supplemental oxygen.  Pam Phillips set up date was 05/22/2022.  She has a ResMed AirSense 10 AutoSet machine.  Today, 07/17/2022: I reviewed Pam Phillips CPAP compliance data from 06/17/2022 through 07/12/2022, which is a total of 26 days, during which time she used Pam Phillips machine every night with percent use days greater than 4 hours at 81%, indicating very good compliance with an average usage of 4 hours and 49 minutes, residual AHI at goal at 0.4/h, leak on the low side with the 95th percentile at 4.3 L/min on  a pressure of 11 cm with EPR of 3.  She reports doing fairly well with CPAP, she feels that she has adjusted well to treatment, she is using the ResMed F30 under the nose fullface mask.  Sometimes will sleep without the mask on as she likes to read before falling asleep and she cannot put Pam Phillips reading glasses on with the mask on and she will fall asleep without it, then wake up in the middle of the night and put the mask on.  She does note some improvement, she feels that Pam Phillips daytime energy and stamina are a little bit better.  She still has tiredness, she attributes some to stress in general.  She is worried about Pam Phillips husband's health.  Pam Phillips mother-in-law also lives with them.  Pam Phillips own mom is in a nursing home.  She is working on weight loss, since starting the Ozempic she has lost about 15 pounds.  She is working with Pam Phillips primary care on this.  She has just recently increased to the 0.5 mg dose.  She is very to continue with Pam Phillips CPAP and agreeable to pursuing a pulse oximetry test while on CPAP therapy as we discussed Pam Phillips oxygen desaturations during both sleep studies. Pam Phillips Epworth sleepiness score is 7 out of 24, previously was 14 out of 24, significantly improved.  The patient's allergies, current medications, family history, past medical history, past social history, past surgical history and problem list were reviewed and updated  as appropriate.   Previously:   04/03/22: (She) reports snoring and excessive daytime somnolence.  Pam Phillips husband has noted pauses in Pam Phillips breathing while she is asleep, he has sleep apnea and uses a machine, a BiPAP per patient report.  She has no family history of sleep apnea.  She has had worsening symptoms with regards to Pam Phillips daytime energy and daytime somnolence.  She attributed this to stress, she takes care of Pam Phillips husband and mother-in-law also lives with them, patient's mom is in a nursing care facility, and patient works full-time.  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  Bedtime is generally between 10 and 10:30 PM, rise time around 6:30 AM.  She has nocturia about once per average night, denies any recurrent nocturnal or morning headaches.  She denies telltale symptoms of restless leg syndrome or leg twitching at night.  She does have significant lower extremity swelling and takes furosemide and also uses compression socks, she tries to raise Pam Phillips legs up at night.  She has recently purchased an adjustable bed but it is not installed yet.  She drinks caffeine in the form of coffee, about 3 cups in the mornings.  She drinks alcohol up to 1 serving per day in the form of wine, she is a non-smoker.  She has no children, they have 1 dog and 1 cat in the household, the dog typically sleeps on the bed with them.  They have a TV in the bedroom but typically do not watch it at night.  She was referred last year for sleep evaluation but could not make an appointment.  I reviewed your office note from 02/01/2021, as well as 03/16/2022.  Pam Phillips Epworth sleepiness score is 14 out of 24, fatigue severity score is 53 out of 63.  Of note, before COVID, she was going regularly to weight watchers and was able to lose about 40 to 45 pounds.  She has had some weight gain and is currently working on weight loss.  Pam Phillips Past Medical History Is Significant For: Past Medical History:  Diagnosis Date   Ankle swelling 07-04-12   mostly left ankle -wears compression hose occ.   Anxiety    Cancer (HCC) 07-04-12   dx. Melanoma in situ -back(mid-scapula area)   Heart murmur    Hyperlipidemia    Hypertension     Pam Phillips Past Surgical History Is Significant For: Past Surgical History:  Procedure Laterality Date   CHOLECYSTECTOMY  07-04-12   '03-Lap. due to gallstones-[place]   COLONOSCOPY N/A 08/18/2013   Procedure: COLONOSCOPY;  Surgeon: Beverley Fiedler, MD;  Location: [facility];  Service: Gastroenterology;  Laterality: N/A;   DENTAL SURGERY  07-04-12   2 wisdom teeth and 2 other  teeth   MELANOMA EXCISION  07/10/2012   Procedure: MELANOMA EXCISION;  Surgeon: Almond Lint, MD;  Location: [facility];  Service: General;  Laterality: N/A;  excision of back melanoma insitu    Pam Phillips Family History Is Significant For: Family History  Problem Relation Age of Onset   Colon cancer Mother    Alzheimer's disease Mother    Hypertension Father    COPD Father    Heart disease Father 89       MI   Cancer Father 37       prostate ca   Coronary artery disease Other 50       1st degree female   Pancreatic cancer Neg Hx    Stomach cancer Neg Hx  Sleep apnea Neg Hx     Pam Phillips Social History Is Significant For: Social History   Socioeconomic History   Marital status: Married    Spouse name: Not on file   Number of children: Not on file   Years of education: Not on file   Highest education level: Not on file  Occupational History   Occupation: Environmental consultant    Employer: [employer]  Tobacco Use   Smoking status: Never   Smokeless tobacco: Never  Substance and Sexual Activity   Alcohol use: Yes    Alcohol/week: 4.0 standard drinks of alcohol    Types: 4 Glasses of wine per week    Comment: r-occ. social 1 wine per week   Drug use: No   Sexual activity: Yes    Partners: Male  Other Topics Concern   Not on file  Social History Narrative   Exercise-- not right now   Social Determinants of Health   Financial Resource Strain: Not on file  Food Insecurity: Not on file  Transportation Needs: Not on file  Physical Activity: Not on file  Stress: Not on file  Social Connections: Not on file    Pam Phillips Allergies Are:  Allergies  Allergen Reactions   Other Swelling    Peaches(fresh) causes throat itching-swelling   Codeine     Nausea & vomiting   Penicillins     angioedema  :   Pam Phillips Current Medications Are:  Outpatient Encounter Medications as of 07/17/2022  Medication Sig   ACCU-CHEK GUIDE test strip TEST UP TO FOUR TIMES DAILY   amLODipine (NORVASC) 2.5  MG tablet TAKE 1 TABLET(2.5 MG) BY MOUTH DAILY   aspirin 81 MG EC tablet Take 81 mg by mouth every morning.   blood glucose meter kit and supplies KIT Dispense based on patient and insurance preference. Use up to four times daily as directed. (FOR ICD-9 250.00, 250.01).   fish oil-omega-3 fatty acids 1000 MG capsule Take 1 g by mouth 2 (two) times daily.    Flaxseed, Linseed, (FLAX SEED OIL PO) Take by mouth.   FLUoxetine (PROZAC) 20 MG tablet TAKE 1 TABLET(20 MG) BY MOUTH DAILY   furosemide (LASIX) 20 MG tablet TAKE 1 TABLET(20 MG) BY MOUTH DAILY   GLUCOSAMINE-CHONDROITIN PO Take 1 capsule by mouth 2 (two) times daily.   LORazepam (ATIVAN) 0.5 MG tablet Take 1 tablet (0.5 mg total) by mouth every 8 (eight) hours as needed for anxiety.   lovastatin (MEVACOR) 40 MG tablet TAKE 1 TABLET(40 MG) BY MOUTH AT BEDTIME   metFORMIN (GLUCOPHAGE) 500 MG tablet Take 1 tablet (500 mg total) by mouth 2 (two) times daily with a meal.   Multiple Vitamin (MULTIVITAMIN) capsule Take 1 capsule by mouth daily.   NYSTATIN powder APPLY EXTERNALLY TO THE AFFECTED AREA THREE TIMES DAILY AS NEEDED   olmesartan-hydrochlorothiazide (BENICAR HCT) 40-25 MG tablet TAKE 1 TABLET BY MOUTH DAILY   polyvinyl alcohol (LIQUIFILM TEARS) 1.4 % ophthalmic solution Place 1 drop into both eyes daily as needed (For dry eyes.).   Semaglutide,0.25 or 0.5MG /DOS, (OZEMPIC, 0.25 OR 0.5 MG/DOSE,) 2 MG/3ML SOPN Inject 0.25mg  once weekly for 1 month, then increase to 0.5mg    tolterodine (DETROL) 2 MG tablet TAKE 1 TABLET(2 MG) BY MOUTH TWICE DAILY   No facility-administered encounter medications on file as of 07/17/2022.  :  Review of Systems:  Out of a complete 14 point review of systems, all are reviewed and negative with the exception of these symptoms as  listed below:   Review of Systems  Neurological:        Initial cpap follow up. Set up 05-22-2022. ESS- 7.  Stamina better, having good benefit from using cpap.      Objective:   Neurological Exam  Physical Exam Physical Examination:   Vitals:   07/17/22 0747  BP: (!) 157/77  Pulse: (!) 57    General Examination: The patient is a very pleasant 61 y.o. female in no acute distress. She appears well-developed and well-nourished and well groomed.   HEENT: Normocephalic, atraumatic, pupils are equal, round and reactive to light, extraocular tracking is good without limitation to gaze excursion or nystagmus noted.  Corrective eyeglasses in place.  Hearing is grossly intact. Face is symmetric with normal facial animation. Speech is clear with no dysarthria noted. There is no hypophonia. There is no lip, neck/head, jaw or voice tremor. Neck is supple with full range of passive and active motion. There are no carotid bruits on auscultation.  Airway examination reveals mild mouth dryness, otherwise stable findings.  Tongue protrudes centrally and palate elevates symmetrically.     Chest: Clear to auscultation without wheezing, rhonchi or crackles noted.  Distant breathing sounds.   Heart: S1+S2+0, regular and normal with possible systolic murmur noted.    Abdomen: Soft, non-tender and non-distended.   Extremities: There is swelling in the distal lower extremities bilaterally.     Skin: Warm and dry without trophic changes noted.    Musculoskeletal: exam reveals no obvious joint deformities.    Neurologically:  Mental status: The patient is awake, alert and oriented in all 4 spheres. Pam Phillips immediate and remote memory, attention, language skills and fund of knowledge are appropriate. There is no evidence of aphasia, agnosia, apraxia or anomia. Speech is clear with normal prosody and enunciation. Thought process is linear. Mood is normal and affect is normal.  Cranial nerves II - XII are as described above under HEENT exam.  Motor exam: Normal bulk, strength and tone is noted. There is no obvious tremor.  Fine motor skills and coordination: grossly intact.  Cerebellar  testing: No dysmetria or intention tremor. There is no truncal or gait ataxia.  Sensory exam: intact to light touch in the upper and lower extremities.  Gait, station and balance: She stands easily. No veering to one side is noted. No leaning to one side is noted. Posture is age-appropriate and stance is narrow based. Gait shows normal stride length and normal pace. No problems turning are noted.    Assessment and Plan:  In summary, Dashea Mcmullan is a very pleasant 61 year old female with an underlying medical history of melanoma, anxiety, hypertension, hyperlipidemia, and morbid obesity with a BMI of over 74, who presents for follow-up consultation of Pam Phillips obstructive sleep apnea after interim testing and starting CPAP therapy at home.  Pam Phillips baseline sleep study from 04/20/22 showed moderate to severe obstructive sleep apnea, with a total AHI of 16.3/hour, REM AHI of 43.4/hour, supine AHI of 21.7/hour and O2 nadir of 71% (during supine REM sleep). She had a subsequent CPAP titration study on 05/05/2022, at which time she was titrated to CPAP of 11 cm.  She was tried on supplemental oxygen with CPAP briefly but at the end of the study it was discontinued, Pam Phillips baseline oxygen saturations drifted closer to 89% with a CPAP of 11 cm without supplemental oxygen.  She is compliant with treatment, she has noticed benefit from CPAP therapy.  She is working on weight loss.  She is commended for Pam Phillips treatment adherence and encouraged to continue with CPAP consistently, try to make sure she gets about 7 to 8 hours of sleep with it.  She is advised to proceed with an overnight pulse oximetry test while on CPAP therapy so we can assess Pam Phillips oxygen saturation at night.  If all goes well, we will plan a follow-up in sleep clinic in 1 year, we will keep Pam Phillips posted as to Pam Phillips pulse oximetry test result by phone call in the interim, or via MyChart messaging.  If Pam Phillips pulse oximetry test indicates ongoing desaturations or low  average oxygen saturations, we may have to increase Pam Phillips CPAP pressure, add supplemental oxygen, and/or consult with pulmonology for an underlying lung disease.  We will review and follow-up accordingly.  I answered all Pam Phillips questions today and she was in agreement with our plan.   I spent 40 minutes in total face-to-face time and in reviewing records during pre-charting, more than 50% of which was spent in counseling and coordination of care, reviewing test results, reviewing medications and treatment regimen and/or in discussing or reviewing the diagnosis of OSA, nocturnal hypoxemia, the prognosis and treatment options. Pertinent laboratory and imaging test results that were available during this visit with the patient were reviewed by me and considered in my medical decision making (see chart for details).

## 2022-07-17 NOTE — Patient Instructions (Addendum)
It was nice to see you again today. I am glad to hear, things are going well with your CPAP machine. You have adjusted well to treatment with your new machine, and you are compliant with it. You have also fulfilled the insurance-mandated compliance percentage, which is reassuring, so you can get ongoing supplies through your insurance. Please talk to your DME provider about getting replacement supplies on a regular basis. Please be sure to change your filter every month, your mask about every 3 months, hose about every 6 months, humidifier chamber about yearly. Some restrictions are imposed by your insurance carrier with regard to how frequently you can get certain supplies.  Your DME company can provide further details if necessary.   Please continue using your CPAP regularly. While your insurance requires that you use PAP at least 4 hours each night on 70% of the nights, I recommend, that you not skip any nights and use it throughout the night if you can. Getting used to PAP and staying with the treatment long term does take time and patience and discipline. Untreated obstructive sleep apnea when it is moderate to severe can have an adverse impact on cardiovascular health and raise her risk for heart disease, arrhythmias, hypertension, congestive heart failure, stroke and diabetes. Untreated obstructive sleep apnea causes sleep disruption, nonrestorative sleep, and sleep deprivation. This can have an impact on your day to day functioning and cause daytime sleepiness and impairment of cognitive function, memory loss, mood disturbance, and problems focussing. Using PAP regularly can improve these symptoms.  As discussed, we will do an overnight oxygen level test, called ONO, and your DME company will call and set this up for one night, while you also use your autoPAP as usual. We will call you with the results. This is to make sure that your oxygen levels stay in the 90s, while you are treated with autoPAP for  your OSA. Remember, your oxygen levels dropped into the 70s during your sleep study on 04/20/22. We did try you on low supplemental oxygen during your second sleep study on 05/05/22.   We will plan to see you in 1 year, you can see one of our nurse practitioners.

## 2022-07-17 NOTE — Progress Notes (Signed)
Fax confirmation received [facility] for new order ONO on cpap on room air.  [phone].

## 2022-07-20 ENCOUNTER — Encounter: Payer: BC Managed Care – PPO | Admitting: Neurology

## 2022-08-10 ENCOUNTER — Telehealth: Payer: Self-pay | Admitting: *Deleted

## 2022-08-10 NOTE — Telephone Encounter (Signed)
Colonoscopy scheduled at the hospital 09-11-22 8:30am

## 2022-08-10 NOTE — Telephone Encounter (Signed)
Pam Phillips,  This pt's BMI is > 50, her procedure will need to be done at the hospital.  Thanks,  Osvaldo Angst

## 2022-08-15 ENCOUNTER — Other Ambulatory Visit: Payer: Self-pay | Admitting: Family Medicine

## 2022-08-15 DIAGNOSIS — F411 Generalized anxiety disorder: Secondary | ICD-10-CM

## 2022-08-16 NOTE — Telephone Encounter (Signed)
Requesting: Ativan Contract: 2019 UDS: 2019 Last OV: 05/15/2022--- v/v Next OV: 09/29/2022 Last Refill: 05/09/2022, #90--0 RF Database:   Please advise

## 2022-08-21 ENCOUNTER — Encounter: Payer: Self-pay | Admitting: Family Medicine

## 2022-08-21 ENCOUNTER — Ambulatory Visit (AMBULATORY_SURGERY_CENTER): Payer: BC Managed Care – PPO

## 2022-08-21 VITALS — Ht 68.0 in | Wt 340.0 lb

## 2022-08-21 DIAGNOSIS — Z86010 Personal history of colonic polyps: Secondary | ICD-10-CM

## 2022-08-21 MED ORDER — NA SULFATE-K SULFATE-MG SULF 17.5-3.13-1.6 GM/177ML PO SOLN: 1.0000 | Freq: Once | ORAL | 0 refills | Status: AC

## 2022-08-21 NOTE — Progress Notes (Signed)
No egg or soy allergy known to patient  No issues known to pt with past sedation with any surgeries or procedures Patient denies ever being told they had issues or difficulty with intubation  No FH of Malignant Hyperthermia Pt is not on diet pills Pt is not on  home 02  Pt is not on blood thinners  Pt denies issues with constipation  No A fib or A flutter Have any cardiac testing pending--no Pt instructed to use [email or web address] or GoodRx for a price reduction on prep   

## 2022-08-25 ENCOUNTER — Other Ambulatory Visit: Payer: Self-pay | Admitting: Family Medicine

## 2022-08-31 ENCOUNTER — Encounter (HOSPITAL_COMMUNITY): Payer: Self-pay | Admitting: Internal Medicine

## 2022-09-01 ENCOUNTER — Encounter: Payer: Self-pay | Admitting: Internal Medicine

## 2022-09-08 NOTE — Anesthesia Preprocedure Evaluation (Deleted)
Anesthesia Evaluation    Reviewed: Allergy & Precautions, Patient's Chart, lab work & pertinent test results  History of Anesthesia Complications Negative for: history of anesthetic complications  Airway        Dental   Pulmonary sleep apnea           Cardiovascular hypertension, Pt. on medications      Neuro/Psych  PSYCHIATRIC DISORDERS Anxiety     negative neurological ROS     GI/Hepatic negative GI ROS, Neg liver ROS,,,  Endo/Other  diabetes, Type 2, Oral Hypoglycemic Agents    Renal/GU negative Renal ROS  Female GU complaint     Musculoskeletal negative musculoskeletal ROS (+)    Abdominal   Peds  Hematology negative hematology ROS (+)   Anesthesia Other Findings On GLP-1 agonist   Reproductive/Obstetrics                             Anesthesia Physical Anesthesia Plan  ASA: 2  Anesthesia Plan: MAC   Post-op Pain Management: Minimal or no pain anticipated   Induction:   PONV Risk Score and Plan: 2 and Propofol infusion and Treatment may vary due to age or medical condition  Airway Management Planned: Nasal Cannula and Natural Airway  Additional Equipment: None  Intra-op Plan:   Post-operative Plan:   Informed Consent:   Plan Discussed with: CRNA and Anesthesiologist  Anesthesia Plan Comments: (MAC if GLP-1 agonist stopped appropriately, otherwise GETA with RSI if GI doc wanting to proceed )       Anesthesia Quick Evaluation

## 2022-09-11 ENCOUNTER — Encounter (HOSPITAL_COMMUNITY): Payer: Self-pay | Admitting: Anesthesiology

## 2022-09-11 ENCOUNTER — Encounter (HOSPITAL_COMMUNITY)
Admission: RE | Disposition: A | Discharge status: Home / Self Care | Payer: Self-pay | Source: Home / Self Care | Attending: Internal Medicine

## 2022-09-11 ENCOUNTER — Encounter (HOSPITAL_COMMUNITY): Payer: Self-pay | Admitting: Internal Medicine

## 2022-09-11 ENCOUNTER — Ambulatory Visit (HOSPITAL_COMMUNITY)
Admission: RE | Admit: 2022-09-11 | Discharge: 2022-09-11 | Disposition: A | Discharge status: Home / Self Care | Payer: BC Managed Care – PPO | Attending: Internal Medicine | Admitting: Internal Medicine

## 2022-09-11 ENCOUNTER — Other Ambulatory Visit: Payer: Self-pay

## 2022-09-11 DIAGNOSIS — I4891 Unspecified atrial fibrillation: Secondary | ICD-10-CM | POA: Diagnosis not present

## 2022-09-11 DIAGNOSIS — D126 Benign neoplasm of colon, unspecified: Secondary | ICD-10-CM

## 2022-09-11 DIAGNOSIS — Z5309 Procedure and treatment not carried out because of other contraindication: Secondary | ICD-10-CM | POA: Diagnosis not present

## 2022-09-11 DIAGNOSIS — Z1211 Encounter for screening for malignant neoplasm of colon: Secondary | ICD-10-CM | POA: Insufficient documentation

## 2022-09-11 SURGERY — CANCELLED PROCEDURE
Anesthesia: Monitor Anesthesia Care

## 2022-09-11 MED ORDER — SODIUM CHLORIDE 0.9 % IV SOLN: INTRAVENOUS | Status: DC

## 2022-09-11 MED ORDER — METOPROLOL TARTRATE 25 MG PO TABS: 25.0000 mg | ORAL_TABLET | Freq: Two times a day (BID) | ORAL | 11 refills | Status: DC

## 2022-09-11 MED ORDER — METOPROLOL TARTRATE 25 MG PO TABS
25.0000 mg | ORAL_TABLET | Freq: Once | ORAL | Status: AC
  Administered 2022-09-11: 25 mg via ORAL
  Filled 2022-09-11: qty 1

## 2022-09-11 SURGICAL SUPPLY — 22 items

## 2022-09-11 NOTE — Progress Notes (Signed)
Pt's vital signs 60 min after PO dose of metoprolol '25mg'$ : HR 101 A fib, RR 15, BP 156/102 and SPO2 100%. Dr. Hilarie Fredrickson made aware and pt is now able to be discharged home. Pt to follow up with afib clinic this week. Jobe Igo, RN

## 2022-09-11 NOTE — H&P (Signed)
GASTROENTEROLOGY PROCEDURE H&P NOTE   Primary Care Physician: [name], DO    Reason for Procedure:   Hx of colon polyps (adenoma)  Plan:    Surveillance colonoscopy  Patient is appropriate for endoscopic procedure(s) in the outpatient hospital stent.  1.  New onset atrial fibrillation --cardiology discussion as per HPI.  Patient is stable with normal blood pressure.  Cardiology did not recommend ER visit however they will arrange outpatient atrial fibrillation appointment in the coming days. -- Begin metoprolol tartrate 25 mg every 12 hours -- We will observe patient for 30 minutes to an hour to ensure heart rate does improve -- Patient was counseled specifically that if she has any symptoms such as chest pain, dyspnea, lightheadedness that she should seek care in the emergency room immediately.  Patient verbalizes understanding.  2.  Surveillance colonoscopy --will be postponed due to new onset atrial fibrillation.  Patient understanding.   HPI: Pam Phillips is a 61 y.o. female who presents for surveillance colonoscopy.  Last colonoscopy 2014.  Medical history as below.  Tolerated the prep.  No recent chest pain or shortness of breath.  No abdominal pain today.  On presentation patient reports feeling nervous and on vital signs and initial heart monitoring she was noted to be in an irregular tachycardia.  Heart rate between 105 and 140.  EKG confirms atrial fibrillation.  She has not had this before.  Again no chest pain or shortness of breath.  No lightheadedness or dizziness.  I spoke with Dr. Farris Has the [facility] cardiologist on-call.  He did not feel that she needed to go to the ER due to lack of symptoms.  He recommended metoprolol tartrate 25 mg every 12 hours and he will arrange an atrial fibrillation office visit for her in the next several days.  Past Medical History:  Diagnosis Date   Ankle swelling 07/04/2012   mostly left ankle -wears compression  hose occ.   Anxiety    Cancer ([place]) 07/04/2012   dx. Melanoma in situ -back(mid-scapula area)   Cataract    Heart murmur    Hyperlipidemia    Hypertension    Sleep apnea     Past Surgical History:  Procedure Laterality Date   CHOLECYSTECTOMY  07-04-12   '03-Lap. due to gallstones-[place]   COLONOSCOPY N/A 08/18/2013   Procedure: COLONOSCOPY;  Surgeon: Jerene Bears, MD;  Location: [facility];  Service: Gastroenterology;  Laterality: N/A;   DENTAL SURGERY  07-04-12   2 wisdom teeth and 2 other teeth   MELANOMA EXCISION  07/10/2012   Procedure: MELANOMA EXCISION;  Surgeon: Stark Klein, MD;  Location: [facility];  Service: General;  Laterality: N/A;  excision of back melanoma insitu    Prior to Admission medications   Medication Sig Start Date End Date Taking? Authorizing Provider  amLODipine (NORVASC) 2.5 MG tablet TAKE 1 TABLET(2.5 MG) BY MOUTH DAILY 05/29/22  Yes [name], DO  aspirin 81 MG EC tablet Take 81 mg by mouth every morning.   Yes [provider]  fish oil-omega-3 fatty acids 1000 MG capsule Take 1 g by mouth 2 (two) times daily.  07/10/11  Yes Lowne Chase, Yvonne R, DO  Flaxseed, Linseed, (FLAX SEED OIL) 1000 MG CAPS Take 1,000 mg by mouth in the morning and at bedtime.   Yes [provider]  FLUoxetine (PROZAC) 20 MG tablet TAKE 1 TABLET(20 MG) BY MOUTH DAILY 12/23/21  Yes [name],  DO  furosemide (LASIX) 20 MG tablet TAKE 1 TABLET(20 MG) BY MOUTH DAILY 05/22/22  Yes Roma Schanz R, DO  GLUCOSAMINE-CHONDROITIN PO Take 1 capsule by mouth 2 (two) times daily.   Yes [provider]  LORazepam (ATIVAN) 0.5 MG tablet TAKE 1 TABLET(0.5 MG) BY MOUTH EVERY 8 HOURS AS NEEDED FOR ANXIETY 08/17/22  Yes Carollee Herter, Yvonne R, DO  lovastatin (MEVACOR) 40 MG tablet TAKE 1 TABLET(40 MG) BY MOUTH AT BEDTIME 05/22/22  Yes Roma Schanz R, DO  metFORMIN (GLUCOPHAGE) 500 MG tablet TAKE 1 TABLET(500 MG) BY MOUTH TWICE DAILY WITH A  MEAL 08/25/22  Yes Roma Schanz R, DO  Multiple Vitamin (MULTIVITAMIN) capsule Take 1 capsule by mouth daily.   Yes [provider]  olmesartan-hydrochlorothiazide (BENICAR HCT) 40-25 MG tablet TAKE 1 TABLET BY MOUTH DAILY 08/25/22  Yes Roma Schanz R, DO  polyvinyl alcohol (LIQUIFILM TEARS) 1.4 % ophthalmic solution Place 1 drop into both eyes daily as needed (For dry eyes.).   Yes [provider]  Semaglutide,0.25 or 0.5MG/DOS, (OZEMPIC, 0.25 OR 0.5 MG/DOSE,) 2 MG/3ML SOPN Inject 0.21m once weekly for 1 month, then increase to 0.530mPatient taking differently: Inject 0.5 mg into the skin every Saturday. 05/17/22  Yes LoRoma Schanz, DO  tolterodine (DETROL) 2 MG tablet TAKE 1 TABLET(2 MG) BY MOUTH TWICE DAILY 10/17/21  Yes LoCarollee HerterYvKendrick Fries, DO  ACCU-CHEK GUIDE test strip TEST UP TO FOUR TIMES DAILY 11/22/21   LoCarollee HerterYvAlferd ApaDO  blood glucose meter kit and supplies KIT Dispense based on patient and insurance preference. Use up to four times daily as directed. (FOR ICD-9 250.00, 250.01). 05/23/22   LoCarollee HerterYvAlferd ApaDO  NYSTATIN powder APPLY EXTERNALLY TO THE AFFECTED AREA THREE TIMES DAILY AS NEEDED Patient not taking: Reported on 08/21/2022 02/08/22   LoAnn HeldDO    Current Facility-Administered Medications  Medication Dose Route Frequency Provider Last Rate Last Admin   0.9 %  sodium chloride infusion   Intravenous Continuous Lovelace Cerveny, JaLajuan LinesMD       metoprolol tartrate (LOPRESSOR) tablet 25 mg  25 mg Oral Once Erienne Spelman, JaLajuan LinesMD        Allergies as of 06/02/2022 - Review Complete 05/16/2022  Allergen Reaction Noted   Other Swelling 07/04/2012   Codeine  11/07/2007   Penicillins  11/07/2007    Family History  Problem Relation Age of Onset   Colon polyps Mother    Colon cancer Mother    Alzheimer's disease Mother    Hypertension Father    COPD Father    Heart disease Father [id number]     MI   Cancer Father [id number]     prostate  ca   Coronary artery disease Other 7425     1st degree female   Pancreatic cancer Neg Hx    Stomach cancer Neg Hx    Sleep apnea Neg Hx    Esophageal cancer Neg Hx    Rectal cancer Neg Hx     Social History   Socioeconomic History   Marital status: Married    Spouse name: Not on file   Number of children: Not on file   Years of education: Not on file   Highest education level: Not on file  Occupational History   Occupation: daEnvironmental consultant  Employer: [employer]   Smoking status: Never  Smokeless tobacco: Never  Substance and Sexual Activity   Alcohol use: Yes    Alcohol/week: 4.0 standard drinks of alcohol    Types: 4 Glasses of wine per week    Comment: r-occ. social 1 wine per week   Drug use: No   Sexual activity: Yes    Partners: Male  Other Topics Concern   Not on file  Social History Narrative   Exercise-- not right now   Social Determinants of Health   Financial Resource Strain: Not on file  Food Insecurity: Not on file  Transportation Needs: Not on file  Physical Activity: Not on file  Stress: Not on file  Social Connections: Not on file  Intimate Partner Violence: Not on file    Physical Exam: Vital signs in last 24 hours: _0  129/83   Pulse (!) 140   Temp (!) 97.4 F (36.3 C) (Temporal)   Resp (!) 23   Ht _1  (1.753 m)   Wt (!) 154.2 kg   LMP 06/26/2012   SpO2 98%   BMI 50.21 kg/m  GEN: NAD EYE: Sclerae anicteric ENT: MMM CV: Irregularly irregular and tachycardic Pulm: CTA b/l GI: Soft, NT/ND, positive bowel sounds NEURO:  Alert & Oriented x 3   Zenovia Jarred, MD [facility]  09/11/2022 8:19 AM

## 2022-09-15 ENCOUNTER — Other Ambulatory Visit: Payer: Self-pay | Admitting: Family Medicine

## 2022-09-16 ENCOUNTER — Other Ambulatory Visit: Payer: Self-pay | Admitting: Family Medicine

## 2022-09-16 DIAGNOSIS — F418 Other specified anxiety disorders: Secondary | ICD-10-CM

## 2022-09-18 ENCOUNTER — Telehealth: Payer: Self-pay | Admitting: Neurology

## 2022-09-18 DIAGNOSIS — G4733 Obstructive sleep apnea (adult) (pediatric): Secondary | ICD-10-CM

## 2022-09-18 DIAGNOSIS — G4734 Idiopathic sleep related nonobstructive alveolar hypoventilation: Secondary | ICD-10-CM

## 2022-09-18 NOTE — Telephone Encounter (Signed)
Patient had a recent overnight pulse oximetry test while on PAP therapy.  Please advise patient that I would recommend repeating the test as there are several signal abnormalities indicating errors.  I do not believe the test results are accurate.    Please also advise DME company, [employer] that ONO needs to be repeated while on PAP therapy.

## 2022-09-19 NOTE — Addendum Note (Signed)
Addended by: Gildardo Griffes on: 09/19/2022 09:34 AM   Modules accepted: Orders

## 2022-09-19 NOTE — Telephone Encounter (Signed)
I called and LMVM for pt that per Dr. Rexene Alberts, recommended repeating ONO on PAP as she did not believe it was accurate as the report showed signal abnormalities.  Will forward this to DME, and pt to return call if questions.

## 2022-09-19 NOTE — Telephone Encounter (Signed)
ONO order signed by Dr Rexene Alberts and faxed to [place] [phone]. Received a receipt of confirmation.

## 2022-09-19 NOTE — Telephone Encounter (Signed)
Spoke with patient about ONO results having signal abnormalities indicating errors. Patient is amenable to having ONO repeated. She was wearing dip powder nail polish the night of the test. I spoke with Dr Rexene Alberts. Patient advised to do the test at her next polish change, not urgent.   ONO order written and is pending MD signature before faxing to [facility].

## 2022-09-22 ENCOUNTER — Ambulatory Visit (HOSPITAL_COMMUNITY)
Admission: RE | Admit: 2022-09-22 | Discharge: 2022-09-22 | Disposition: A | Discharge status: Home / Self Care | Payer: BC Managed Care – PPO | Source: Ambulatory Visit | Attending: Physician Assistant | Admitting: Physician Assistant

## 2022-09-22 ENCOUNTER — Encounter (HOSPITAL_COMMUNITY): Payer: Self-pay | Admitting: Physician Assistant

## 2022-09-22 ENCOUNTER — Telehealth: Payer: Self-pay | Admitting: *Deleted

## 2022-09-22 ENCOUNTER — Encounter: Payer: BC Managed Care – PPO | Admitting: Family Medicine

## 2022-09-22 VITALS — BP 134/88 | HR 48 | Ht 69.0 in | Wt 350.2 lb

## 2022-09-22 DIAGNOSIS — E119 Type 2 diabetes mellitus without complications: Secondary | ICD-10-CM | POA: Insufficient documentation

## 2022-09-22 DIAGNOSIS — G4733 Obstructive sleep apnea (adult) (pediatric): Secondary | ICD-10-CM | POA: Insufficient documentation

## 2022-09-22 DIAGNOSIS — Z7985 Long-term (current) use of injectable non-insulin antidiabetic drugs: Secondary | ICD-10-CM | POA: Insufficient documentation

## 2022-09-22 DIAGNOSIS — E785 Hyperlipidemia, unspecified: Secondary | ICD-10-CM | POA: Insufficient documentation

## 2022-09-22 DIAGNOSIS — R001 Bradycardia, unspecified: Secondary | ICD-10-CM | POA: Insufficient documentation

## 2022-09-22 DIAGNOSIS — Z7984 Long term (current) use of oral hypoglycemic drugs: Secondary | ICD-10-CM | POA: Diagnosis not present

## 2022-09-22 DIAGNOSIS — I10 Essential (primary) hypertension: Secondary | ICD-10-CM | POA: Insufficient documentation

## 2022-09-22 DIAGNOSIS — Z8249 Family history of ischemic heart disease and other diseases of the circulatory system: Secondary | ICD-10-CM | POA: Insufficient documentation

## 2022-09-22 DIAGNOSIS — D6869 Other thrombophilia: Secondary | ICD-10-CM | POA: Diagnosis not present

## 2022-09-22 DIAGNOSIS — Z6841 Body Mass Index (BMI) 40.0 and over, adult: Secondary | ICD-10-CM | POA: Insufficient documentation

## 2022-09-22 DIAGNOSIS — E669 Obesity, unspecified: Secondary | ICD-10-CM | POA: Diagnosis not present

## 2022-09-22 DIAGNOSIS — I480 Paroxysmal atrial fibrillation: Secondary | ICD-10-CM | POA: Insufficient documentation

## 2022-09-22 LAB — BASIC METABOLIC PANEL
Anion gap: 10 (ref 5–15)
BUN: 20 mg/dL (ref 8–23)
CO2: 26 mmol/L (ref 22–32)
Calcium: 9.3 mg/dL (ref 8.9–10.3)
Chloride: 104 mmol/L (ref 98–111)
Creatinine, Ser: 0.81 mg/dL (ref 0.44–1.00)
GFR, Estimated: >60 mL/min (ref >60)
Glucose, Bld: 103 mg/dL — ABNORMAL HIGH (ref 70–99)
Potassium: 3.7 mmol/L (ref 3.5–5.1)
Sodium: 140 mmol/L (ref 135–145)

## 2022-09-22 LAB — CBC
HCT: 38.7 % (ref 36.0–46.0)
Hemoglobin: 12.7 g/dL (ref 12.0–15.0)
MCH: 32.7 pg (ref 26.0–34.0)
MCHC: 32.8 g/dL (ref 30.0–36.0)
MCV: 99.7 fL (ref 80.0–100.0)
Platelets: 171 10*3/uL (ref 150–400)
RBC: 3.88 MIL/uL (ref 3.87–5.11)
RDW: 12.4 % (ref 11.5–15.5)
WBC: 7 10*3/uL (ref 4.0–10.5)
nRBC: 0 % (ref 0.0–0.2)

## 2022-09-22 LAB — TSH: TSH: 2.072 u[IU]/mL (ref 0.350–4.500)

## 2022-09-22 LAB — MAGNESIUM: Magnesium: 2 mg/dL (ref 1.7–2.4)

## 2022-09-22 MED ORDER — METOPROLOL TARTRATE 25 MG PO TABS: 12.5000 mg | ORAL_TABLET | Freq: Two times a day (BID) | ORAL | 11 refills | Status: DC

## 2022-09-22 MED ORDER — APIXABAN 5 MG PO TABS: 5.0000 mg | ORAL_TABLET | Freq: Two times a day (BID) | ORAL | 3 refills | Status: DC

## 2022-09-22 NOTE — Telephone Encounter (Signed)
Patient is scheduled for echocardiogram on 10/13/22. Will follow...  When ok to schedule patient, she will need to have colonoscopy procedure completed at hospital (bmi >50).

## 2022-09-22 NOTE — Telephone Encounter (Signed)
===  View-only below this line=== ----- Message ----- From: Jerene Bears, MD Sent: 09/22/2022  12:01 PM EST To: Larina Bras, CMA  Would wait until ECHO is done to ensure okay for procedure in [place]

## 2022-09-22 NOTE — Telephone Encounter (Signed)
-----   Message from Jerene Bears, MD sent at 09/22/2022 12:00 PM EST ----- Pt had presented for procedure but was canceled due to afib She has been started on Eliquis If okay with cards to hold DOAC procedure can be rescheduled Thanks JMP  ----- Message ----- From: Oliver Barre, PA Sent: 09/22/2022   9:12 AM EST To: [name], DO; Jerene Bears, MD

## 2022-09-22 NOTE — Patient Instructions (Signed)
Stop aspirin  Start Eliquis '5mg'$  twice a day   Decrease metoprolol to 12.'5mg'$  twice a day

## 2022-09-22 NOTE — Progress Notes (Signed)
Primary Care Physician: Carollee Herter, Alferd Apa, DO Primary Cardiologist: none Primary Electrophysiologist: none Referring Physician: Dr Sheilah Pigeon Pam Phillips is a 61 y.o. female with a history of HTN, DM, HLD, OSA, atrial fibrillation who presents for consultation in the [facility].  The patient was initially diagnosed with atrial fibrillation 09/11/22 after presenting for a routine colonoscopy with Dr Hilarie Fredrickson. She was noted to have an irregular tachycardia and ECG showed afib with RVR. Cardiology was consulted who recommended Lopressor 25 mg BID with close follow up in the AF clinic. Patient has a CHADS2VASC score of 3. She states that she had noticed some "fluttering" that morning but thought she was just anxious about the procedure. She has had this fluttering sensation in the past, about once every 1-2 months which lasts for about an hour. Her fluttering resolved by the time she left the hospital. She is in SR today. She drinks a glass of wine most nights of the week and is compliant with her CPAP.   Today, she denies symptoms of chest pain, shortness of breath, orthopnea, PND, lower extremity edema, dizziness, presyncope, syncope, snoring, daytime somnolence, bleeding, or neurologic sequela. The patient is tolerating medications without difficulties and is otherwise without complaint today.    Atrial Fibrillation Risk Factors:  she does have symptoms or diagnosis of sleep apnea. she is compliant with CPAP therapy. she does not have a history of rheumatic fever. she does have a history of alcohol use. The patient does have a history of early familial atrial fibrillation or other arrhythmias. Mother and brother have afib.  she has a BMI of Body mass index is 51.72 kg/m.Marland Kitchen Filed Weights   09/22/22 0828  Weight: (!) 158.8 kg    Family History  Problem Relation Age of Onset   Colon polyps Mother    Colon cancer Mother    Alzheimer's disease Mother     Hypertension Father    COPD Father    Heart disease Father 34       MI   Cancer Father 1       prostate ca   Coronary artery disease Other 45       1st degree female   Pancreatic cancer Neg Hx    Stomach cancer Neg Hx    Sleep apnea Neg Hx    Esophageal cancer Neg Hx    Rectal cancer Neg Hx      Atrial Fibrillation Management history:  Previous antiarrhythmic drugs: none Previous cardioversions: none Previous ablations: none CHADS2VASC score: 3 Anticoagulation history: none   Past Medical History:  Diagnosis Date   Ankle swelling 07/04/2012   mostly left ankle -wears compression hose occ.   Anxiety    Cancer ([place]) 07/04/2012   dx. Melanoma in situ -back(mid-scapula area)   Cataract    Heart murmur    Hyperlipidemia    Hypertension    Sleep apnea    Past Surgical History:  Procedure Laterality Date   CHOLECYSTECTOMY  07-04-12   '03-Lap. due to gallstones-[place]   COLONOSCOPY N/A 08/18/2013   Procedure: COLONOSCOPY;  Surgeon: Jerene Bears, MD;  [facility]: [facility];  Service: Gastroenterology;  Laterality: N/A;   DENTAL SURGERY  07-04-12   2 wisdom teeth and 2 other teeth   MELANOMA EXCISION  07/10/2012   Procedure: MELANOMA EXCISION;  Surgeon: Stark Klein, MD;  Location: [facility];  Service: General;  Laterality: N/A;  excision of back melanoma insitu    Current  Outpatient Medications  Medication Sig Dispense Refill   ACCU-CHEK GUIDE test strip TEST UP TO FOUR TIMES DAILY 100 strip 1   amLODipine (NORVASC) 2.5 MG tablet TAKE 1 TABLET(2.5 MG) BY MOUTH DAILY 90 tablet 1   aspirin 81 MG EC tablet Take 81 mg by mouth every morning.     blood glucose meter kit and supplies KIT Dispense based on patient and insurance preference. Use up to four times daily as directed. (FOR ICD-9 250.00, 250.01). 1 each 0   fish oil-omega-3 fatty acids 1000 MG capsule Take 1 g by mouth 2 (two) times daily.      Flaxseed, Linseed, (FLAX SEED OIL) 1000 MG CAPS Take 1,000 mg by  mouth in the morning and at bedtime.     FLUoxetine (PROZAC) 20 MG tablet TAKE 1 TABLET(20 MG) BY MOUTH DAILY 90 tablet 1   furosemide (LASIX) 20 MG tablet TAKE 1 TABLET(20 MG) BY MOUTH DAILY 90 tablet 1   GLUCOSAMINE-CHONDROITIN PO Take 1 capsule by mouth 2 (two) times daily.     LORazepam (ATIVAN) 0.5 MG tablet TAKE 1 TABLET(0.5 MG) BY MOUTH EVERY 8 HOURS AS NEEDED FOR ANXIETY 90 tablet 1   lovastatin (MEVACOR) 40 MG tablet TAKE 1 TABLET(40 MG) BY MOUTH AT BEDTIME 90 tablet 1   metFORMIN (GLUCOPHAGE) 500 MG tablet TAKE 1 TABLET(500 MG) BY MOUTH TWICE DAILY WITH A MEAL 60 tablet 2   metoprolol tartrate (LOPRESSOR) 25 MG tablet Take 1 tablet (25 mg total) by mouth 2 (two) times daily. 60 tablet 11   Multiple Vitamin (MULTIVITAMIN) capsule Take 1 capsule by mouth daily.     NYSTATIN powder APPLY EXTERNALLY TO THE AFFECTED AREA THREE TIMES DAILY AS NEEDED 60 g 5   olmesartan-hydrochlorothiazide (BENICAR HCT) 40-25 MG tablet TAKE 1 TABLET BY MOUTH DAILY 90 tablet 1   polyvinyl alcohol (LIQUIFILM TEARS) 1.4 % ophthalmic solution Place 1 drop into both eyes daily as needed (For dry eyes.).     [START ON 09/23/2022] Semaglutide,0.25 or 0.5MG/DOS, (OZEMPIC, 0.25 OR 0.5 MG/DOSE,) 2 MG/3ML SOPN Inject 0.5 mg into the skin every Saturday. 3 mL 3   tolterodine (DETROL) 2 MG tablet TAKE 1 TABLET(2 MG) BY MOUTH TWICE DAILY 180 tablet 3   No current facility-administered medications for this encounter.    Allergies  Allergen Reactions   Other Swelling    Peaches(fresh) causes throat itching-swelling   Codeine     Nausea & vomiting   Penicillins     angioedema    Social History   Socioeconomic History   Marital status: Married    Spouse name: Not on file   Number of children: Not on file   Years of education: Not on file   Highest education level: Not on file  Occupational History   Occupation: Environmental consultant    Employer: [employer]  Tobacco Use   Smoking status: Never   Smokeless  tobacco: Never   Tobacco comments:    Never smoke 09/22/22  Substance and Sexual Activity   Alcohol use: Yes    Alcohol/week: 7.0 standard drinks of alcohol    Types: 7 Glasses of wine per week    Comment: 1 glass of wine nightly 09/22/22   Drug use: No   Sexual activity: Yes    Partners: Male  Other Topics Concern   Not on file  Social History Narrative   Exercise-- not right now   Social Determinants of Health   Financial Resource Strain: Not on file  Food Insecurity: Not on file  Transportation Needs: Not on file  Physical Activity: Not on file  Stress: Not on file  Social Connections: Not on file  Intimate Partner Violence: Not on file     ROS- All systems are reviewed and negative except as per the HPI above.  Physical Exam: Vitals:   09/22/22 0828  BP: 134/88  Pulse: (!) 48  Weight: (!) 158.8 kg  Height: _0  (1.753 m)    GEN- The patient is a well appearing obese female, alert and oriented x 3 today.   Head- normocephalic, atraumatic Eyes-  Sclera clear, conjunctiva pink Ears- hearing intact Oropharynx- clear Neck- supple  Lungs- Clear to ausculation bilaterally, normal work of breathing Heart- Regular rate and rhythm, bradycardia, no murmurs, rubs or gallops  GI- soft, NT, ND, + BS Extremities- no clubbing, cyanosis, or edema MS- no significant deformity or atrophy Skin- no rash or lesion Psych- euthymic mood, full affect Neuro- strength and sensation are intact  Wt Readings from Last 3 Encounters:  09/22/22 (!) 158.8 kg  09/11/22 (!) 154.2 kg  08/21/22 (!) 154.2 kg    EKG today demonstrates  SB Vent. rate 48 BPM PR interval 162 ms QRS duration 88 ms QT/QTcB 450/402 ms  Echo 03/05/13 demonstrated  - Left ventricle: The cavity size was normal. Wall thickness    was increased in a pattern of mild LVH. The estimated    ejection fraction was 60%. Wall motion was normal; there    were no regional wall motion abnormalities.  - Right ventricle:  The cavity size was normal. Systolic    function was normal.  Transthoracic echocardiography.  M-mode, complete 2D,  spectral Doppler, and color Doppler.   Epic records are reviewed at length today  CHA2DS2-VASc Score = 3  The patient's score is based upon: CHF History: 0 HTN History: 1 Diabetes History: 1 Stroke History: 0 Vascular Disease History: 0 Age Score: 0 Gender Score: 1       ASSESSMENT AND PLAN: 1. Paroxysmal Atrial Fibrillation (ICD10:  I48.0) The patient's CHA2DS2-VASc score is 3, indicating a 3.2% annual risk of stroke.   General education about afib provided and questions answered. We also discussed her stroke risk and the risks and benefits of anticoagulation. Will start Eliquis 5 mg BID Decrease Lopressor to 12.5 mg BID given bradycardia and fatigue.  Check echocardiogram Check bmet/TSH/mag/cbc today  2. Secondary Hypercoagulable State (ICD10:  D68.69) The patient is at significant risk for stroke/thromboembolism based upon her CHA2DS2-VASc Score of 3.  Start Apixaban (Eliquis).   3. Obesity Body mass index is 51.72 kg/m. Lifestyle modification was discussed at length including regular exercise and weight reduction.  4. Obstructive sleep apnea The importance of adequate treatment of sleep apnea was discussed today in order to improve our ability to maintain sinus rhythm long term. Patient followed by Dr Rexene Alberts  Encouraged compliance with CPAP therapy.   5. HTN Stable, no changes today.   Follow up in the AF clinic in one month. Will also consider referring to establish care with a primary cardiologist at that time. Her husband is a patient of Dr Irish Lack.    [facility] [address] [place], [place] [address] [phone] 09/22/2022 8:34 AM

## 2022-09-29 ENCOUNTER — Ambulatory Visit (INDEPENDENT_AMBULATORY_CARE_PROVIDER_SITE_OTHER): Payer: BC Managed Care – PPO | Admitting: Family Medicine

## 2022-09-29 ENCOUNTER — Encounter: Payer: Self-pay | Admitting: Family Medicine

## 2022-09-29 ENCOUNTER — Ambulatory Visit: Payer: BC Managed Care – PPO | Admitting: Family Medicine

## 2022-09-29 VITALS — BP 132/74 | HR 51 | Temp 97.9°F | Resp 18 | Ht 69.0 in | Wt 352.6 lb

## 2022-09-29 DIAGNOSIS — I10 Essential (primary) hypertension: Secondary | ICD-10-CM | POA: Diagnosis not present

## 2022-09-29 DIAGNOSIS — E785 Hyperlipidemia, unspecified: Secondary | ICD-10-CM

## 2022-09-29 DIAGNOSIS — E1165 Type 2 diabetes mellitus with hyperglycemia: Secondary | ICD-10-CM | POA: Diagnosis not present

## 2022-09-29 DIAGNOSIS — E119 Type 2 diabetes mellitus without complications: Secondary | ICD-10-CM

## 2022-09-29 DIAGNOSIS — E1169 Type 2 diabetes mellitus with other specified complication: Secondary | ICD-10-CM

## 2022-09-29 DIAGNOSIS — Z6841 Body Mass Index (BMI) 40.0 and over, adult: Secondary | ICD-10-CM

## 2022-09-29 LAB — LIPID PANEL
Cholesterol: 134 mg/dL (ref 0–200)
HDL: 46 mg/dL (ref >39.00)
LDL Cholesterol: 63 mg/dL (ref 0–99)
NonHDL: 88.15
Total CHOL/HDL Ratio: 3
Triglycerides: 125 mg/dL (ref 0.0–149.0)
VLDL: 25 mg/dL (ref 0.0–40.0)

## 2022-09-29 LAB — COMPREHENSIVE METABOLIC PANEL
ALT: 18 U/L (ref 0–35)
AST: 15 U/L (ref 0–37)
Albumin: 4.3 g/dL (ref 3.5–5.2)
Alkaline Phosphatase: 53 U/L (ref 39–117)
BUN: 17 mg/dL (ref 6–23)
CO2: 31 mEq/L (ref 19–32)
Calcium: 9.4 mg/dL (ref 8.4–10.5)
Chloride: 102 mEq/L (ref 96–112)
Creatinine, Ser: 0.66 mg/dL (ref 0.40–1.20)
GFR: 94.09 mL/min (ref >60.00)
Glucose, Bld: 92 mg/dL (ref 70–99)
Potassium: 3.8 mEq/L (ref 3.5–5.1)
Sodium: 142 mEq/L (ref 135–145)
Total Bilirubin: 1 mg/dL (ref 0.2–1.2)
Total Protein: 6.8 g/dL (ref 6.0–8.3)

## 2022-09-29 LAB — MICROALBUMIN / CREATININE URINE RATIO
Creatinine,U: 103.8 mg/dL
Microalb Creat Ratio: 0.7 mg/g (ref 0.0–30.0)
Microalb, Ur: <0.7 mg/dL (ref 0.0–1.9)

## 2022-09-29 LAB — CBC WITH DIFFERENTIAL/PLATELET
Basophils Absolute: 0.1 10*3/uL (ref 0.0–0.1)
Basophils Relative: 0.9 % (ref 0.0–3.0)
Eosinophils Absolute: 0.1 10*3/uL (ref 0.0–0.7)
Eosinophils Relative: 1.6 % (ref 0.0–5.0)
HCT: 37 % (ref 36.0–46.0)
Hemoglobin: 12.5 g/dL (ref 12.0–15.0)
Lymphocytes Relative: 22.9 % (ref 12.0–46.0)
Lymphs Abs: 1.7 10*3/uL (ref 0.7–4.0)
MCHC: 33.8 g/dL (ref 30.0–36.0)
MCV: 96.8 fl (ref 78.0–100.0)
Monocytes Absolute: 0.5 10*3/uL (ref 0.1–1.0)
Monocytes Relative: 7 % (ref 3.0–12.0)
Neutro Abs: 5.1 10*3/uL (ref 1.4–7.7)
Neutrophils Relative %: 67.6 % (ref 43.0–77.0)
Platelets: 179 10*3/uL (ref 150.0–400.0)
RBC: 3.82 Mil/uL — ABNORMAL LOW (ref 3.87–5.11)
RDW: 13 % (ref 11.5–15.5)
WBC: 7.5 10*3/uL (ref 4.0–10.5)

## 2022-09-29 LAB — HEMOGLOBIN A1C: Hgb A1c MFr Bld: 5.6 % (ref 4.6–6.5)

## 2022-09-29 NOTE — Patient Instructions (Signed)

## 2022-09-29 NOTE — Progress Notes (Unsigned)
Established Patient Office Visit  Subjective   Patient ID: Pam Phillips, female    DOB: [birth year]  Age: 61 y.o. MRN: [id number]  Chief Complaint  Patient presents with  . Hypertension  . Diabetes  . Follow-up    HPI Pt here to f/u dm, chol and htn---- bs about 102   HYPERTENSION  Blood pressure range-not checking   Chest pain- no      Dyspnea- no Lightheadedness- no   Edema- no Other side effects - no   Medication compliance: good Low salt diet- yes  DIABETES  Blood Sugar ranges-100-102  Polyuria- no New Visual problems- no Hypoglycemic symptoms- no Other side effects-no Medication compliance - good Last eye exam- 12/29 Foot exam- today  HYPERLIPIDEMIA  Medication compliance- good  RUQ pain- no  Muscle aches- no Other side effects-no    Patient Active Problem List   Diagnosis Date Noted  . Paroxysmal atrial fibrillation ([place]) 09/22/2022  . Hypercoagulable state due to paroxysmal atrial fibrillation ([place]) 09/22/2022  . Edema 08/15/2021  . Diabetes mellitus type II, non insulin dependent ([place]) 12/02/2019  . Acute upper respiratory infection 08/31/2014  . Preventative health care 08/18/2013  . Benign neoplasm of colon 08/18/2013  . Morbid obesity with BMI of 50.0-59.9, adult ([place]) 05/23/2013  . Melanoma in situ of back ([place]) 06/18/2012  . Snoring 01/02/2011  . MORBID OBESITY 07/04/2010  . Hyperlipidemia LDL goal <100 12/16/2009  . INCONTINENCE, URGE 11/07/2007  . Anxiety 11/06/2006  . Essential hypertension 11/06/2006   Past Medical History:  Diagnosis Date  . Ankle swelling 07/04/2012   mostly left ankle -wears compression hose occ.  Marland Kitchen Anxiety   . Cancer ([place]) 07/04/2012   dx. Melanoma in situ -back(mid-scapula area)  . Cataract   . Heart murmur   . Hyperlipidemia   . Hypertension   . Sleep apnea    Past Surgical History:  Procedure Laterality Date  . CHOLECYSTECTOMY  07-04-12   '03-Lap. due to gallstones-[place]  .  COLONOSCOPY N/A 08/18/2013   Procedure: COLONOSCOPY;  Surgeon: Jerene Bears, MD;  Location: [facility];  Service: Gastroenterology;  Laterality: N/A;  . DENTAL SURGERY  07-04-12   2 wisdom teeth and 2 other teeth  . MELANOMA EXCISION  07/10/2012   Procedure: MELANOMA EXCISION;  Surgeon: Stark Klein, MD;  Location: [facility];  Service: General;  Laterality: N/A;  excision of back melanoma insitu   Social History   Tobacco Use  . Smoking status: Never  . Smokeless tobacco: Never  . Tobacco comments:    Never smoke 09/22/22  Substance Use Topics  . Alcohol use: Yes    Alcohol/week: 7.0 standard drinks of alcohol    Types: 7 Glasses of wine per week    Comment: 1 glass of wine nightly 09/22/22  . Drug use: No   Social History   Socioeconomic History  . Marital status: Married    Spouse name: Not on file  . Number of children: Not on file  . Years of education: Not on file  . Highest education level: Not on file  Occupational History  . Occupation: Environmental health practitioner: [employer]  Tobacco Use  . Smoking status: Never  . Smokeless tobacco: Never  . Tobacco comments:    Never smoke 09/22/22  Substance and Sexual Activity  . Alcohol use: Yes    Alcohol/week: 7.0 standard drinks of alcohol    Types: 7 Glasses of wine per week    Comment:  1 glass of wine nightly 09/22/22  . Drug use: No  . Sexual activity: Yes    Partners: Male  Other Topics Concern  . Not on file  Social History Narrative   Exercise-- not right now   Social Determinants of Health   Financial Resource Strain: Not on file  Food Insecurity: Not on file  Transportation Needs: Not on file  Physical Activity: Not on file  Stress: Not on file  Social Connections: Not on file  Intimate Partner Violence: Not on file   Family Status  Relation Name Status  . Mother  Alive  . Father  Deceased  . Brother  Alive  . Other  (Not Specified)  . Neg Hx  (Not Specified)   Family History  Problem  Relation Age of Onset  . Colon polyps Mother   . Colon cancer Mother   . Alzheimer's disease Mother   . Hypertension Father   . COPD Father   . Heart disease Father 76       MI  . Cancer Father 34       prostate ca  . Coronary artery disease Other 43       1st degree female  . Pancreatic cancer Neg Hx   . Stomach cancer Neg Hx   . Sleep apnea Neg Hx   . Esophageal cancer Neg Hx   . Rectal cancer Neg Hx    Allergies  Allergen Reactions  . Other Swelling    Peaches(fresh) causes throat itching-swelling  . Codeine     Nausea & vomiting  . Penicillins     angioedema      ROS Review of Systems  Constitutional: Negative for activity change, appetite change and fatigue.  HENT: Negative for hearing loss, congestion, tinnitus and ear discharge.  dentist q50mEyes: Negative for visual disturbance (see optho q1y -- vision corrected to 20/20 with glasses).  Respiratory: Negative for cough, chest tightness and shortness of breath.   Cardiovascular: Negative for chest pain, palpitations and leg swelling.  Gastrointestinal: Negative for abdominal pain, diarrhea, constipation and abdominal distention.  Genitourinary: Negative for urgency, frequency, decreased urine volume and difficulty urinating.  Musculoskeletal: Negative for back pain, arthralgias and gait problem.  Skin: Negative for color change, pallor and rash.  Neurological: Negative for dizziness, light-headedness, numbness and headaches.  Hematological: Negative for adenopathy. Does not bruise/bleed easily.  Psychiatric/Behavioral: Negative for suicidal ideas, confusion, sleep disturbance, self-injury, dysphoric mood, decreased concentration and agitation.       Objective:     BP 132/74 (BP Location: Left Arm, Patient Position: Sitting, Cuff Size: Large)   Pulse (!) 51   Temp 97.9 F (36.6 C) (Oral)   Resp 18   Ht '5\' 9"'$  (1.753 m)   Wt (!) 352 lb 9.6 oz (159.9 kg)   LMP 06/26/2012   SpO2 99%   BMI 52.07 kg/m  BP  Readings from Last 3 Encounters:  09/29/22 132/74  09/22/22 134/88  09/11/22 (!) 150/11   Wt Readings from Last 3 Encounters:  09/29/22 (!) 352 lb 9.6 oz (159.9 kg)  09/22/22 (!) 350 lb 3.2 oz (158.8 kg)  09/11/22 (!) 340 lb (154.2 kg)   SpO2 Readings from Last 3 Encounters:  09/29/22 99%  09/11/22 98%  03/16/22 98%      Physical Exam Vitals and nursing note reviewed.  Constitutional:      Appearance: She is well-developed.  HENT:     Head: Normocephalic and atraumatic.  Eyes:  Conjunctiva/sclera: Conjunctivae normal.  Neck:     Thyroid: No thyromegaly.     Vascular: No carotid bruit or JVD.  Cardiovascular:     Rate and Rhythm: Normal rate and regular rhythm.     Heart sounds: Normal heart sounds. No murmur heard. Pulmonary:     Effort: Pulmonary effort is normal. No respiratory distress.     Breath sounds: Normal breath sounds. No wheezing or rales.  Chest:     Chest wall: No tenderness.  Musculoskeletal:     Cervical back: Normal range of motion and neck supple.  Neurological:     Mental Status: She is alert and oriented to person, place, and time.    No results found for any visits on 09/29/22.  Last CBC Lab Results  Component Value Date   WBC 7.0 09/22/2022   HGB 12.7 09/22/2022   HCT 38.7 09/22/2022   MCV 99.7 09/22/2022   MCH 32.7 09/22/2022   RDW 12.4 09/22/2022   PLT 171 20/35/5974   Last metabolic panel Lab Results  Component Value Date   GLUCOSE 103 (H) 09/22/2022   NA 140 09/22/2022   K 3.7 09/22/2022   CL 104 09/22/2022   CO2 26 09/22/2022   BUN 20 09/22/2022   CREATININE 0.81 09/22/2022   GFRNONAA >60 09/22/2022   CALCIUM 9.3 09/22/2022   PROT 6.8 03/16/2022   ALBUMIN 4.3 03/16/2022   BILITOT 1.0 03/16/2022   ALKPHOS 53 03/16/2022   AST 15 03/16/2022   ALT 18 03/16/2022   ANIONGAP 10 09/22/2022   Last lipids Lab Results  Component Value Date   CHOL 153 03/16/2022   HDL 52.50 03/16/2022   LDLCALC 79 03/16/2022   TRIG  107.0 03/16/2022   CHOLHDL 3 03/16/2022   Last hemoglobin A1c Lab Results  Component Value Date   HGBA1C 6.2 03/16/2022   Last thyroid functions Lab Results  Component Value Date   TSH 2.072 09/22/2022   Last vitamin D No results found for: "25OHVITD2", "25OHVITD3", "VD25OH" Last vitamin B12 and Folate No results found for: "VITAMINB12", "FOLATE"    The 10-year ASCVD risk score (Arnett DK, et al., 2019) is: 9.4%    Assessment & Plan:   Problem List Items Addressed This Visit   None   No follow-ups on file.    [name], DO

## 2022-09-30 ENCOUNTER — Encounter: Payer: Self-pay | Admitting: Family Medicine

## 2022-09-30 NOTE — Assessment & Plan Note (Signed)
Tolerating statin, encouraged heart healthy diet, avoid trans fats, minimize simple carbs and saturated fats. Increase exercise as tolerated 

## 2022-09-30 NOTE — Assessment & Plan Note (Signed)
Pt is working on diet and exercise

## 2022-09-30 NOTE — Assessment & Plan Note (Signed)
Well controlled, no changes to meds. Encouraged heart healthy diet such as the DASH diet and exercise as tolerated.  °

## 2022-09-30 NOTE — Assessment & Plan Note (Signed)
hgba1c to be checked minimize simple carbs. Increase exercise as tolerated. Continue current meds 

## 2022-10-13 ENCOUNTER — Ambulatory Visit (HOSPITAL_COMMUNITY): Payer: BC Managed Care – PPO

## 2022-10-17 NOTE — Telephone Encounter (Signed)
Echo rescheduled to 11/10/22. Will continue to follow. Of note, patient has now been placed on Eliquis.

## 2022-10-21 ENCOUNTER — Telehealth: Payer: Self-pay | Admitting: Family Medicine

## 2022-10-21 DIAGNOSIS — J069 Acute upper respiratory infection, unspecified: Secondary | ICD-10-CM

## 2022-10-22 MED ORDER — BENZONATATE 200 MG PO CAPS: 200.0000 mg | ORAL_CAPSULE | Freq: Two times a day (BID) | ORAL | 0 refills | Status: DC | PRN

## 2022-10-22 NOTE — Progress Notes (Signed)
We are sorry that you are not feeling well.  Here is how we plan to help!  Based on your presentation I believe you most likely have A cough due to a virus.  This is called viral bronchitis and is best treated by rest, plenty of fluids and control of the cough.  You may use Ibuprofen or Tylenol as directed to help your symptoms.     In addition you may use A prescription cough medication called Tessalon Perles 100mg. You may take 1-2 capsules every 8 hours as needed for your cough.    From your responses in the eVisit questionnaire you describe inflammation in the upper respiratory tract which is causing a significant cough.  This is commonly called Bronchitis and has four common causes:   Allergies Viral Infections Acid Reflux Bacterial Infection Allergies, viruses and acid reflux are treated by controlling symptoms or eliminating the cause. An example might be a cough caused by taking certain blood pressure medications. You stop the cough by changing the medication. Another example might be a cough caused by acid reflux. Controlling the reflux helps control the cough.  USE OF BRONCHODILATOR ("RESCUE") INHALERS: There is a risk from using your bronchodilator too frequently.  The risk is that over-reliance on a medication which only relaxes the muscles surrounding the breathing tubes can reduce the effectiveness of medications prescribed to reduce swelling and congestion of the tubes themselves.  Although you feel brief relief from the bronchodilator inhaler, your asthma may actually be worsening with the tubes becoming more swollen and filled with mucus.  This can delay other crucial treatments, such as oral steroid medications. If you need to use a bronchodilator inhaler daily, several times per day, you should discuss this with your provider.  There are probably better treatments that could be used to keep your asthma under control.     HOME CARE Only take medications as instructed by your  medical team. Complete the entire course of an antibiotic. Drink plenty of fluids and get plenty of rest. Avoid close contacts especially the very young and the elderly Cover your mouth if you cough or cough into your sleeve. Always remember to wash your hands A steam or ultrasonic humidifier can help congestion.   GET HELP RIGHT AWAY IF: You develop worsening fever. You become short of breath You cough up blood. Your symptoms persist after you have completed your treatment plan MAKE SURE YOU  Understand these instructions. Will watch your condition. Will get help right away if you are not doing well or get worse.    Thank you for choosing an e-visit.  Your e-visit answers were reviewed by a board certified advanced clinical practitioner to complete your personal care plan. Depending upon the condition, your plan could have included both over the counter or prescription medications.  Please review your pharmacy choice. Make sure the pharmacy is open so you can pick up prescription now. If there is a problem, you may contact your provider through MyChart messaging and have the prescription routed to another pharmacy.  Your safety is important to us. If you have drug allergies check your prescription carefully.   For the next 24 hours you can use MyChart to ask questions about today's visit, request a non-urgent call back, or ask for a work or school excuse. You will get an email in the next two days asking about your experience. I hope that your e-visit has been valuable and will speed your recovery.    have   provided 5 minutes of non face to face time during this encounter for chart review and documentation.   

## 2022-11-03 ENCOUNTER — Ambulatory Visit (HOSPITAL_COMMUNITY): Payer: BC Managed Care – PPO | Admitting: Physician Assistant

## 2022-11-10 ENCOUNTER — Ambulatory Visit (HOSPITAL_COMMUNITY)
Admission: RE | Admit: 2022-11-10 | Discharge: 2022-11-10 | Disposition: A | Discharge status: Home / Self Care | Payer: BC Managed Care – PPO | Source: Ambulatory Visit | Attending: Physician Assistant | Admitting: Physician Assistant

## 2022-11-10 ENCOUNTER — Ambulatory Visit (HOSPITAL_BASED_OUTPATIENT_CLINIC_OR_DEPARTMENT_OTHER)
Admission: RE | Admit: 2022-11-10 | Discharge: 2022-11-10 | Disposition: A | Discharge status: Home / Self Care | Payer: BC Managed Care – PPO | Source: Ambulatory Visit | Attending: Physician Assistant | Admitting: Physician Assistant

## 2022-11-10 ENCOUNTER — Encounter (HOSPITAL_COMMUNITY): Payer: Self-pay | Admitting: *Deleted

## 2022-11-10 VITALS — BP 152/72 | HR 52 | Ht 69.0 in | Wt 342.6 lb

## 2022-11-10 DIAGNOSIS — I480 Paroxysmal atrial fibrillation: Secondary | ICD-10-CM

## 2022-11-10 DIAGNOSIS — E669 Obesity, unspecified: Secondary | ICD-10-CM | POA: Diagnosis not present

## 2022-11-10 DIAGNOSIS — Z79899 Other long term (current) drug therapy: Secondary | ICD-10-CM | POA: Insufficient documentation

## 2022-11-10 DIAGNOSIS — Z6841 Body Mass Index (BMI) 40.0 and over, adult: Secondary | ICD-10-CM | POA: Diagnosis not present

## 2022-11-10 DIAGNOSIS — Z7901 Long term (current) use of anticoagulants: Secondary | ICD-10-CM | POA: Diagnosis not present

## 2022-11-10 DIAGNOSIS — I10 Essential (primary) hypertension: Secondary | ICD-10-CM | POA: Insufficient documentation

## 2022-11-10 DIAGNOSIS — E785 Hyperlipidemia, unspecified: Secondary | ICD-10-CM | POA: Insufficient documentation

## 2022-11-10 DIAGNOSIS — R001 Bradycardia, unspecified: Secondary | ICD-10-CM | POA: Insufficient documentation

## 2022-11-10 DIAGNOSIS — Z8249 Family history of ischemic heart disease and other diseases of the circulatory system: Secondary | ICD-10-CM | POA: Insufficient documentation

## 2022-11-10 DIAGNOSIS — G4733 Obstructive sleep apnea (adult) (pediatric): Secondary | ICD-10-CM | POA: Diagnosis not present

## 2022-11-10 DIAGNOSIS — D6869 Other thrombophilia: Secondary | ICD-10-CM

## 2022-11-10 LAB — CBC
HCT: 36.9 % (ref 36.0–46.0)
Hemoglobin: 12.1 g/dL (ref 12.0–15.0)
MCH: 32.5 pg (ref 26.0–34.0)
MCHC: 32.8 g/dL (ref 30.0–36.0)
MCV: 99.2 fL (ref 80.0–100.0)
Platelets: 177 10*3/uL (ref 150–400)
RBC: 3.72 MIL/uL — ABNORMAL LOW (ref 3.87–5.11)
RDW: 12.6 % (ref 11.5–15.5)
WBC: 6.9 10*3/uL (ref 4.0–10.5)
nRBC: 0 % (ref 0.0–0.2)

## 2022-11-10 LAB — BASIC METABOLIC PANEL
Anion gap: 10 (ref 5–15)
BUN: 15 mg/dL (ref 8–23)
CO2: 26 mmol/L (ref 22–32)
Calcium: 8.8 mg/dL — ABNORMAL LOW (ref 8.9–10.3)
Chloride: 102 mmol/L (ref 98–111)
Creatinine, Ser: 0.72 mg/dL (ref 0.44–1.00)
GFR, Estimated: >60 mL/min (ref >60)
Glucose, Bld: 113 mg/dL — ABNORMAL HIGH (ref 70–99)
Potassium: 3.8 mmol/L (ref 3.5–5.1)
Sodium: 138 mmol/L (ref 135–145)

## 2022-11-10 LAB — ECHOCARDIOGRAM COMPLETE
Area-P 1/2: 2.69 cm2
Calc EF: 62.4 %
Height: 69 in
S' Lateral: 4 cm
Single Plane A2C EF: 57.8 %
Single Plane A4C EF: 64.6 %
Weight: 5481.6 oz

## 2022-11-10 NOTE — Progress Notes (Signed)
Primary Care Physician: Carollee Herter, Alferd Apa, DO Primary Cardiologist: none Primary Electrophysiologist: none Referring Physician: Dr Sheilah Pigeon Pam Phillips is a 62 y.o. female with a history of HTN, DM, HLD, OSA, atrial fibrillation who presents for follow up in the [facility].  The patient was initially diagnosed with atrial fibrillation 09/11/22 after presenting for a routine colonoscopy with Dr Hilarie Fredrickson. She was noted to have an irregular tachycardia and ECG showed afib with RVR. Cardiology was consulted who recommended Lopressor 25 mg BID with close follow up in the AF clinic. Patient has a CHADS2VASC score of 3. She states that she had noticed some "fluttering" that morning but thought she was just anxious about the procedure. She has had this fluttering sensation in the past, about once every 1-2 months which lasts for about an hour. Her fluttering resolved by the time she left the hospital. She drinks a glass of wine most nights of the week and is compliant with her CPAP. Stated on Eliquis 09/22/22.  On follow up today, patient reports that she has done well since her last visit. She did have one episode of afib which lasted about one hour (confirmed on [employer] mobile). This was in the setting of acute influenza. No bleeding issues on anticoagulation.   Today, she denies symptoms of palpitations, chest pain, shortness of breath, orthopnea, PND, lower extremity edema, dizziness, presyncope, syncope, bleeding, or neurologic sequela. The patient is tolerating medications without difficulties and is otherwise without complaint today.    Atrial Fibrillation Risk Factors:  she does have symptoms or diagnosis of sleep apnea. she is compliant with CPAP therapy. she does not have a history of rheumatic fever. she does have a history of alcohol use. The patient does have a history of early familial atrial fibrillation or other arrhythmias. Mother and brother  have afib.  she has a BMI of Body mass index is 50.59 kg/m.Marland Kitchen Filed Weights   11/10/22 0842  Weight: (!) 155.4 kg    Family History  Problem Relation Age of Onset   Colon polyps Mother    Colon cancer Mother    Alzheimer's disease Mother    Hypertension Father    COPD Father    Heart disease Father 60       MI   Cancer Father 47       prostate ca   Coronary artery disease Other 64       1st degree female   Pancreatic cancer Neg Hx    Stomach cancer Neg Hx    Sleep apnea Neg Hx    Esophageal cancer Neg Hx    Rectal cancer Neg Hx      Atrial Fibrillation Management history:  Previous antiarrhythmic drugs: none Previous cardioversions: none Previous ablations: none CHADS2VASC score: 3 Anticoagulation history: Eliquis   Past Medical History:  Diagnosis Date   Ankle swelling 07/04/2012   mostly left ankle -wears compression hose occ.   Anxiety    Cancer ([facility]) 07/04/2012   dx. Melanoma in situ -back(mid-scapula area)   Cataract    Heart murmur    Hyperlipidemia    Hypertension    Sleep apnea    Past Surgical History:  Procedure Laterality Date   CHOLECYSTECTOMY  07-04-12   '03-Lap. due to gallstones-[place]   COLONOSCOPY N/A 08/18/2013   Procedure: COLONOSCOPY;  Surgeon: Jerene Bears, MD;  Location: [facility];  Service: Gastroenterology;  Laterality: N/A;   DENTAL SURGERY  07-04-12  2 wisdom teeth and 2 other teeth   MELANOMA EXCISION  07/10/2012   Procedure: MELANOMA EXCISION;  Surgeon: Stark Klein, MD;  Location: [facility];  Service: General;  Laterality: N/A;  excision of back melanoma insitu    Current Outpatient Medications  Medication Sig Dispense Refill   ACCU-CHEK GUIDE test strip TEST UP TO FOUR TIMES DAILY 100 strip 1   amLODipine (NORVASC) 2.5 MG tablet TAKE 1 TABLET(2.5 MG) BY MOUTH DAILY 90 tablet 1   apixaban (ELIQUIS) 5 MG TABS tablet Take 1 tablet (5 mg total) by mouth 2 (two) times daily. 60 tablet 3   blood glucose meter kit and  supplies KIT Dispense based on patient and insurance preference. Use up to four times daily as directed. (FOR ICD-9 250.00, 250.01). 1 each 0   fish oil-omega-3 fatty acids 1000 MG capsule Take 1 g by mouth 2 (two) times daily.      Flaxseed, Linseed, (FLAX SEED OIL) 1000 MG CAPS Take 1,000 mg by mouth in the morning and at bedtime.     FLUoxetine (PROZAC) 20 MG tablet TAKE 1 TABLET(20 MG) BY MOUTH DAILY 90 tablet 1   furosemide (LASIX) 20 MG tablet TAKE 1 TABLET(20 MG) BY MOUTH DAILY 90 tablet 1   GLUCOSAMINE-CHONDROITIN PO Take 1 capsule by mouth 2 (two) times daily.     LORazepam (ATIVAN) 0.5 MG tablet TAKE 1 TABLET(0.5 MG) BY MOUTH EVERY 8 HOURS AS NEEDED FOR ANXIETY 90 tablet 1   lovastatin (MEVACOR) 40 MG tablet TAKE 1 TABLET(40 MG) BY MOUTH AT BEDTIME 90 tablet 1   metFORMIN (GLUCOPHAGE) 500 MG tablet TAKE 1 TABLET(500 MG) BY MOUTH TWICE DAILY WITH A MEAL 60 tablet 2   metoprolol tartrate (LOPRESSOR) 25 MG tablet Take 0.5 tablets (12.5 mg total) by mouth 2 (two) times daily. 60 tablet 11   Multiple Vitamin (MULTIVITAMIN) capsule Take 1 capsule by mouth daily.     NYSTATIN powder APPLY EXTERNALLY TO THE AFFECTED AREA THREE TIMES DAILY AS NEEDED 60 g 5   olmesartan-hydrochlorothiazide (BENICAR HCT) 40-25 MG tablet TAKE 1 TABLET BY MOUTH DAILY 90 tablet 1   polyvinyl alcohol (LIQUIFILM TEARS) 1.4 % ophthalmic solution Place 1 drop into both eyes daily as needed (For dry eyes.).     Semaglutide,0.25 or 0.'5MG'$ /DOS, (OZEMPIC, 0.25 OR 0.5 MG/DOSE,) 2 MG/3ML SOPN Inject 0.5 mg into the skin every Saturday. 3 mL 3   tolterodine (DETROL) 2 MG tablet TAKE 1 TABLET(2 MG) BY MOUTH TWICE DAILY 180 tablet 3   benzonatate (TESSALON) 200 MG capsule Take 1 capsule (200 mg total) by mouth 2 (two) times daily as needed for cough. (Patient not taking: Reported on 11/10/2022) 20 capsule 0   No current facility-administered medications for this encounter.    Allergies  Allergen Reactions   Other Swelling     Peaches(fresh) causes throat itching-swelling   Codeine     Nausea & vomiting   Penicillins     angioedema    Social History   Socioeconomic History   Marital status: Married    Spouse name: Not on file   Number of children: Not on file   Years of education: Not on file   Highest education level: Not on file  Occupational History   Occupation: Environmental consultant    Employer: [employer]  Tobacco Use   Smoking status: Never   Smokeless tobacco: Never   Tobacco comments:    Never smoke 09/22/22  Substance and Sexual Activity   Alcohol  use: Yes    Alcohol/week: 7.0 standard drinks of alcohol    Types: 7 Glasses of wine per week    Comment: 1 glass of wine nightly 09/22/22   Drug use: No   Sexual activity: Yes    Partners: Male  Other Topics Concern   Not on file  Social History Narrative   Exercise-- not right now   Social Determinants of Health   Financial Resource Strain: Not on file  Food Insecurity: Not on file  Transportation Needs: Not on file  Physical Activity: Not on file  Stress: Not on file  Social Connections: Not on file  Intimate Partner Violence: Not on file     ROS- All systems are reviewed and negative except as per the HPI above.  Physical Exam: Vitals:   11/10/22 0842  BP: (!) 152/72  Pulse: (!) 52  Weight: (!) 155.4 kg  Height: '5\' 9"'$  (1.753 m)     GEN- The patient is a well appearing obese female, alert and oriented x 3 today.   HEENT-head normocephalic, atraumatic, sclera clear, conjunctiva pink, hearing intact, trachea midline. Lungs- Clear to ausculation bilaterally, normal work of breathing Heart- Regular rate and rhythm, bradycardia, no murmurs, rubs or gallops  GI- soft, NT, ND, + BS Extremities- no clubbing, cyanosis, or edema MS- no significant deformity or atrophy Skin- no rash or lesion Psych- euthymic mood, full affect Neuro- strength and sensation are intact   Wt Readings from Last 3 Encounters:  11/10/22 (!)  155.4 kg  09/29/22 (!) 159.9 kg  09/22/22 (!) 158.8 kg    EKG today demonstrates  SB Vent. rate 52 BPM PR interval 148 ms QRS duration 92 ms QT/QTcB 444/412 ms  Echo 03/05/13 demonstrated  - Left ventricle: The cavity size was normal. Wall thickness    was increased in a pattern of mild LVH. The estimated    ejection fraction was 60%. Wall motion was normal; there    were no regional wall motion abnormalities.  - Right ventricle: The cavity size was normal. Systolic    function was normal.  Transthoracic echocardiography.  M-mode, complete 2D,  spectral Doppler, and color Doppler.   Epic records are reviewed at length today  CHA2DS2-VASc Score = 3  The patient's score is based upon: CHF History: 0 HTN History: 1 Diabetes History: 1 Stroke History: 0 Vascular Disease History: 0 Age Score: 0 Gender Score: 1       ASSESSMENT AND PLAN: 1. Paroxysmal Atrial Fibrillation (ICD10:  I48.0) The patient's CHA2DS2-VASc score is 3, indicating a 3.2% annual risk of stroke.   Patient had one brief episode of afib in the setting of the flu.  We briefly discussed AAD today, will continue present therapy for now and consider adding if her burden increases.  She has a Investment banker, operational mobile for home monitoring.  Continue Eliquis 5 mg BID Continue Lopressor 12.5 mg BID  Echocardiogram scheduled later today.   2. Secondary Hypercoagulable State (ICD10:  D68.69) The patient is at significant risk for stroke/thromboembolism based upon her CHA2DS2-VASc Score of 3.  Continue Apixaban (Eliquis).   3. Obesity Body mass index is 50.59 kg/m. Lifestyle modification was discussed and encouraged including regular physical activity and weight reduction.  4. Obstructive sleep apnea Patient followed by Dr Rexene Alberts  Encouraged compliance with CPAP therapy.  5. HTN Stable, no changes today.   Follow up in the AF clinic in 3 months. Will also refer to establish care with a primary cardiologist. Her  husband  is a patient of Dr Irish Lack.    [facility] [address] [place], [place] [address] [phone] 11/10/2022 9:03 AM

## 2022-11-10 NOTE — Progress Notes (Signed)
  Echocardiogram 2D Echocardiogram has been performed.  Pam Phillips 11/10/2022, 10:36 AM

## 2022-11-13 NOTE — Telephone Encounter (Signed)
Primary Cardiologist:None  Chart reviewed as part of pre-operative protocol coverage. Because of Pam Phillips's past medical history she will require a follow-up visit in order to better assess preoperative cardiovascular risk.  Pre-op covering staff: - Please schedule appointment and call patient to inform them. - Please contact requesting surgeon's office via preferred method (i.e, phone, fax) to inform them of need for appointment prior to surgery.  Clearance to hold Eliquis is attached below.    Emmaline Life, NP-C  11/13/2022, 1:15 PM [address] Office [phone] Fax [phone]

## 2022-11-13 NOTE — Telephone Encounter (Signed)
Patient with diagnosis of PAF(paroxysmal atrial fibrillation) on Eliquis  for anticoagulation.    Procedure: Colonoscopy   Date of procedure: TBD   CHA2DS2-VASc Score = 3   This indicates a 3.2% annual risk of stroke. The patient's score is based upon: CHF History: 0 HTN History: 1 Diabetes History: 1 Stroke History: 0 Vascular Disease History: 0 Age Score: 0 Gender Score: 1     CrCl >100 ml/min  Platelet count 177     Per office protocol, patient can hold Eliquis for 2 days prior to procedure.     **This guidance is not considered finalized until pre-operative APP has relayed final recommendations.**

## 2022-11-13 NOTE — Telephone Encounter (Signed)
Request for surgical clearance:     Endoscopy Procedure  What type of surgery is being performed?     Colonoscopy   When is this surgery scheduled?     TBD  What type of clearance is required ?   Pharmacy/Medical  Are there any medications that need to be held prior to surgery and how long? Xarelto, 2 days  Practice name and name of physician performing surgery?      [facility]  What is your office phone and fax number?      Phone- [phone]  [phone]  Anesthesia type (None, local, MAC, general) ?       MAC   In addition, is patient cleared from a cardiology standpoint to have procedure at this time?

## 2022-11-25 ENCOUNTER — Other Ambulatory Visit: Payer: Self-pay | Admitting: Family Medicine

## 2022-11-25 DIAGNOSIS — I10 Essential (primary) hypertension: Secondary | ICD-10-CM

## 2022-12-09 ENCOUNTER — Other Ambulatory Visit: Payer: Self-pay | Admitting: Family Medicine

## 2022-12-09 DIAGNOSIS — R32 Unspecified urinary incontinence: Secondary | ICD-10-CM

## 2022-12-09 DIAGNOSIS — E785 Hyperlipidemia, unspecified: Secondary | ICD-10-CM

## 2022-12-09 DIAGNOSIS — R609 Edema, unspecified: Secondary | ICD-10-CM

## 2022-12-16 ENCOUNTER — Other Ambulatory Visit (HOSPITAL_COMMUNITY): Payer: Self-pay | Admitting: Physician Assistant

## 2023-01-06 ENCOUNTER — Other Ambulatory Visit: Payer: Self-pay | Admitting: Family Medicine

## 2023-01-17 ENCOUNTER — Ambulatory Visit: Payer: BC Managed Care – PPO | Admitting: Interventional Cardiology

## 2023-02-06 ENCOUNTER — Encounter: Payer: Self-pay | Admitting: Family Medicine

## 2023-02-06 ENCOUNTER — Telehealth: Payer: BC Managed Care – PPO | Admitting: Family Medicine

## 2023-02-06 VITALS — HR 65 | Temp 96.9°F | Wt 340.0 lb

## 2023-02-06 DIAGNOSIS — F321 Major depressive disorder, single episode, moderate: Secondary | ICD-10-CM | POA: Diagnosis not present

## 2023-02-06 MED ORDER — FLUOXETINE HCL 40 MG PO CAPS: 40.0000 mg | ORAL_CAPSULE | Freq: Every day | ORAL | 3 refills | Status: DC

## 2023-02-06 NOTE — Progress Notes (Addendum)
MyChart Video Visit    Virtual Visit via Video Note   This visit type was conducted due to national recommendations for restrictions regarding the COVID-19 Pandemic (e.g. social distancing) in an effort to limit this patient's exposure and mitigate transmission in our community. This patient is at least at moderate risk for complications without adequate follow up. This format is felt to be most appropriate for this patient at this time. Physical exam was limited by quality of the video and audio technology used for the visit. Pam Phillips  was able to get the patient set up on a video visit.  Patient location: Home Patient and provider in visit Provider location: Office  I discussed the limitations of evaluation and management by telemedicine and the availability of in person appointments. The patient expressed understanding and agreed to proceed.  Visit Date: 02/06/2023  Today's healthcare provider: Donato Schultz, DO     Subjective:    Patient ID: Pam Phillips, female    DOB: March 27, 1961, 62 y.o.   MRN: [id number]  Chief Complaint  Patient presents with   Depression    Been on prozac, just a lot of things going on at home and feeling overwhelmed.  Does have a therapist.    Depression        Associated symptoms include no headaches and no suicidal ideas.  Patient is in today for a video visit.   She complains her depression has worsened. She feels overwhelmed due to unexpected expenses and her family members are ill. She has difficulty concentrating on her jobs recently. She is sleeping ok but has difficulty getting out of bed. She has no motivation to complete any tasks. She is seeing a therapist regularly at this time. She continues taking 20 mg Prozac daily PO. She is interested in increasing her dosage to help her symptoms.   Past Medical History:  Diagnosis Date   Ankle swelling 07/04/2012   mostly left ankle -wears compression hose occ.   Anxiety     Cancer 07/04/2012   dx. Melanoma in situ -back(mid-scapula area)   Cataract    Heart murmur    Hyperlipidemia    Hypertension    Sleep apnea     Past Surgical History:  Procedure Laterality Date   CHOLECYSTECTOMY  07-04-12   '03-Lap. due to gallstones-[place]   COLONOSCOPY N/A 08/18/2013   Procedure: COLONOSCOPY;  Surgeon: Beverley Fiedler, MD;  Location: [facility];  Service: Gastroenterology;  Laterality: N/A;   DENTAL SURGERY  07-04-12   2 wisdom teeth and 2 other teeth   MELANOMA EXCISION  07/10/2012   Procedure: MELANOMA EXCISION;  Surgeon: Almond Lint, MD;  Location: [facility];  Service: General;  Laterality: N/A;  excision of back melanoma insitu    Family History  Problem Relation Age of Onset   Colon polyps Mother    Colon cancer Mother    Alzheimer's disease Mother    Hypertension Father    COPD Father    Heart disease Father 48       MI   Cancer Father 33       prostate ca   Coronary artery disease Other 46       1st degree female   Pancreatic cancer Neg Hx    Stomach cancer Neg Hx    Sleep apnea Neg Hx    Esophageal cancer Neg Hx    Rectal cancer Neg Hx     Social History   Socioeconomic History  Marital status: Married    Spouse name: Not on file   Number of children: Not on file   Years of education: Not on file   Highest education level: Not on file  Occupational History   Occupation: Neurosurgeon    Employer: [employer]  Tobacco Use   Smoking status: Never   Smokeless tobacco: Never   Tobacco comments:    Never smoke 09/22/22  Substance and Sexual Activity   Alcohol use: Yes    Alcohol/week: 7.0 standard drinks of alcohol    Types: 7 Glasses of wine per week    Comment: 1 glass of wine nightly 09/22/22   Drug use: No   Sexual activity: Yes    Partners: Male  Other Topics Concern   Not on file  Social History Narrative   Exercise-- not right now   Social Determinants of Health   Financial Resource Strain: Not on file   Food Insecurity: Not on file  Transportation Needs: Not on file  Physical Activity: Not on file  Stress: Not on file  Social Connections: Not on file  Intimate Partner Violence: Not on file    Outpatient Medications Prior to Visit  Medication Sig Dispense Refill   ACCU-CHEK GUIDE test strip TEST UP TO FOUR TIMES DAILY 100 strip 1   amLODipine (NORVASC) 2.5 MG tablet TAKE 1 TABLET(2.5 MG) BY MOUTH DAILY 90 tablet 1   blood glucose meter kit and supplies KIT Dispense based on patient and insurance preference. Use up to four times daily as directed. (FOR ICD-9 250.00, 250.01). 1 each 0   ELIQUIS 5 MG TABS tablet TAKE 1 TABLET(5 MG) BY MOUTH TWICE DAILY 60 tablet 3   fish oil-omega-3 fatty acids 1000 MG capsule Take 1 g by mouth 2 (two) times daily.      Flaxseed, Linseed, (FLAX SEED OIL) 1000 MG CAPS Take 1,000 mg by mouth in the morning and at bedtime.     furosemide (LASIX) 20 MG tablet TAKE 1 TABLET(20 MG) BY MOUTH DAILY 90 tablet 1   GLUCOSAMINE-CHONDROITIN PO Take 1 capsule by mouth 2 (two) times daily.     LORazepam (ATIVAN) 0.5 MG tablet TAKE 1 TABLET(0.5 MG) BY MOUTH EVERY 8 HOURS AS NEEDED FOR ANXIETY 90 tablet 1   lovastatin (MEVACOR) 40 MG tablet TAKE 1 TABLET(40 MG) BY MOUTH AT BEDTIME 90 tablet 1   metFORMIN (GLUCOPHAGE) 500 MG tablet TAKE 1 TABLET(500 MG) BY MOUTH TWICE DAILY WITH A MEAL 180 tablet 1   metoprolol tartrate (LOPRESSOR) 25 MG tablet Take 0.5 tablets (12.5 mg total) by mouth 2 (two) times daily. 60 tablet 11   Multiple Vitamin (MULTIVITAMIN) capsule Take 1 capsule by mouth daily.     NYSTATIN powder APPLY EXTERNALLY TO THE AFFECTED AREA THREE TIMES DAILY AS NEEDED 60 g 5   olmesartan-hydrochlorothiazide (BENICAR HCT) 40-25 MG tablet TAKE 1 TABLET BY MOUTH DAILY 90 tablet 1   polyvinyl alcohol (LIQUIFILM TEARS) 1.4 % ophthalmic solution Place 1 drop into both eyes daily as needed (For dry eyes.).     Semaglutide,0.25 or 0.5MG /DOS, (OZEMPIC, 0.25 OR 0.5 MG/DOSE,) 2  MG/3ML SOPN Inject 0.5 mg into the skin once a week. 1.5 mL 3   tolterodine (DETROL) 2 MG tablet TAKE 1 TABLET(2 MG) BY MOUTH TWICE DAILY 180 tablet 1   FLUoxetine (PROZAC) 20 MG tablet TAKE 1 TABLET(20 MG) BY MOUTH DAILY 90 tablet 1   benzonatate (TESSALON) 200 MG capsule Take 1 capsule (200 mg total) by  mouth 2 (two) times daily as needed for cough. (Patient not taking: Reported on 11/10/2022) 20 capsule 0   No facility-administered medications prior to visit.    Allergies  Allergen Reactions   Other Swelling    Peaches(fresh) causes throat itching-swelling   Codeine     Nausea & vomiting   Penicillins     angioedema    Review of Systems  Constitutional:  Negative for fever and malaise/fatigue.  HENT:  Negative for congestion.   Eyes:  Negative for blurred vision.  Respiratory:  Negative for shortness of breath.   Cardiovascular:  Negative for chest pain, palpitations and leg swelling.  Gastrointestinal:  Negative for abdominal pain, blood in stool and nausea.  Genitourinary:  Negative for dysuria and frequency.  Musculoskeletal:  Negative for falls.  Skin:  Negative for rash.  Neurological:  Negative for dizziness, loss of consciousness and headaches.  Endo/Heme/Allergies:  Negative for environmental allergies.  Psychiatric/Behavioral:  Positive for depression. Negative for substance abuse and suicidal ideas. The patient is not nervous/anxious.        Objective:    Physical Exam Vitals and nursing note reviewed.  Psychiatric:        Attention and Perception: Attention and perception normal.        Mood and Affect: Mood is anxious and depressed. Affect is tearful.        Speech: Speech normal.        Behavior: Behavior normal. Behavior is cooperative.        Cognition and Memory: Cognition normal.        Judgment: Judgment normal.    Pulse 65   Temp (!) 96.9 F (36.1 C)   Wt (!) 340 lb (154.2 kg)   LMP 06/26/2012   SpO2 97%   BMI 50.21 kg/m  Wt Readings from  Last 3 Encounters:  02/06/23 (!) 340 lb (154.2 kg)  11/10/22 (!) 342 lb 9.6 oz (155.4 kg)  09/29/22 (!) 352 lb 9.6 oz (159.9 kg)       Assessment & Plan:  Depression, major, single episode, moderate -     FLUoxetine HCl; Take 1 capsule (40 mg total) by mouth daily.  Dispense: 30 capsule; Refill: 3   Increase prozac 40 mg daily  F/u 1 month  I discussed the assessment and treatment plan with the patient. The patient was provided an opportunity to ask questions and all were answered. The patient agreed with the plan and demonstrated an understanding of the instructions.   The patient was advised to call back or seek an in-person evaluation if the symptoms worsen or if the condition fails to improve as anticipated.  Donato Schultz, DO  [facility] [phone] (phone) [phone] (fax)  [facility]    I,[name],acting as a scribe for Donato Schultz, DO.,have documented all relevant documentation on the behalf of Donato Schultz, DO,as directed by  Donato Schultz, DO while in the presence of Donato Schultz, DO.

## 2023-02-09 ENCOUNTER — Ambulatory Visit (HOSPITAL_COMMUNITY): Payer: BC Managed Care – PPO | Admitting: Physician Assistant

## 2023-02-13 ENCOUNTER — Ambulatory Visit (HOSPITAL_COMMUNITY)
Admission: RE | Admit: 2023-02-13 | Discharge: 2023-02-13 | Disposition: A | Discharge status: Home / Self Care | Payer: BC Managed Care – PPO | Source: Ambulatory Visit | Attending: Physician Assistant | Admitting: Physician Assistant

## 2023-02-13 VITALS — BP 114/70 | HR 51 | Ht 69.0 in | Wt 336.4 lb

## 2023-02-13 DIAGNOSIS — I10 Essential (primary) hypertension: Secondary | ICD-10-CM | POA: Insufficient documentation

## 2023-02-13 DIAGNOSIS — E669 Obesity, unspecified: Secondary | ICD-10-CM | POA: Diagnosis not present

## 2023-02-13 DIAGNOSIS — Z6841 Body Mass Index (BMI) 40.0 and over, adult: Secondary | ICD-10-CM | POA: Insufficient documentation

## 2023-02-13 DIAGNOSIS — D6869 Other thrombophilia: Secondary | ICD-10-CM

## 2023-02-13 DIAGNOSIS — G4733 Obstructive sleep apnea (adult) (pediatric): Secondary | ICD-10-CM | POA: Diagnosis not present

## 2023-02-13 DIAGNOSIS — Z79899 Other long term (current) drug therapy: Secondary | ICD-10-CM | POA: Diagnosis not present

## 2023-02-13 DIAGNOSIS — I480 Paroxysmal atrial fibrillation: Secondary | ICD-10-CM | POA: Insufficient documentation

## 2023-02-13 NOTE — Progress Notes (Signed)
Primary Care Physician: Zola Button, Grayling Congress, DO Primary Cardiologist: Dr Elease Hashimoto (new) Primary Electrophysiologist: none Referring Physician: Dr Shirlee Limerick Pam Phillips is a 62 y.o. female with a history of HTN, DM, HLD, OSA, atrial fibrillation who presents for follow up in the [facility].  The patient was initially diagnosed with atrial fibrillation 09/11/22 after presenting for a routine colonoscopy with Dr Rhea Belton. She was noted to have an irregular tachycardia and ECG showed afib with RVR. Cardiology was consulted who recommended Lopressor 25 mg BID with close follow up in the AF clinic. Patient has a CHADS2VASC score of 3. She states that she had noticed some "fluttering" that morning but thought she was just anxious about the procedure. She has had this fluttering sensation in the past, about once every 1-2 months which lasts for about an hour. Her fluttering resolved by the time she left the hospital. She drinks a glass of wine most nights of the week and is compliant with her CPAP. Stated on Eliquis 09/22/22.  On follow up today, patient reports that she has had two episodes of afib since her last visit, each lasting < 2 hours. There did not appear to be any specific trigger. No bleeding issues on anticoagulation.   Today, she denies symptoms of chest pain, shortness of breath, orthopnea, PND, lower extremity edema, dizziness, presyncope, syncope, bleeding, or neurologic sequela. The patient is tolerating medications without difficulties and is otherwise without complaint today.    Atrial Fibrillation Risk Factors:  she does have symptoms or diagnosis of sleep apnea. she is compliant with CPAP therapy. she does not have a history of rheumatic fever. she does have a history of alcohol use. The patient does have a history of early familial atrial fibrillation or other arrhythmias. Mother and brother have afib.  she has a BMI of Body mass index is  49.68 kg/m.Marland Kitchen Filed Weights   02/13/23 1445  Weight: (!) 152.6 kg   Family History  Problem Relation Age of Onset   Colon polyps Mother    Colon cancer Mother    Alzheimer's disease Mother    Hypertension Father    COPD Father    Heart disease Father 28       MI   Cancer Father 43       prostate ca   Coronary artery disease Other 81       1st degree female   Pancreatic cancer Neg Hx    Stomach cancer Neg Hx    Sleep apnea Neg Hx    Esophageal cancer Neg Hx    Rectal cancer Neg Hx      Atrial Fibrillation Management history:  Previous antiarrhythmic drugs: none Previous cardioversions: none Previous ablations: none CHADS2VASC score: 3 Anticoagulation history: Eliquis   Past Medical History:  Diagnosis Date   Ankle swelling 07/04/2012   mostly left ankle -wears compression hose occ.   Anxiety    Cancer (HCC) 07/04/2012   dx. Melanoma in situ -back(mid-scapula area)   Cataract    Heart murmur    Hyperlipidemia    Hypertension    Sleep apnea    Past Surgical History:  Procedure Laterality Date   CHOLECYSTECTOMY  07-04-12   '03-Lap. due to gallstones-[place]   COLONOSCOPY N/A 08/18/2013   Procedure: COLONOSCOPY;  Surgeon: Beverley Fiedler, MD;  Location: [facility];  Service: Gastroenterology;  Laterality: N/A;   DENTAL SURGERY  07-04-12   2 wisdom teeth and 2 other teeth  MELANOMA EXCISION  07/10/2012   Procedure: MELANOMA EXCISION;  Surgeon: Almond Lint, MD;  Location: [facility];  Service: General;  Laterality: N/A;  excision of back melanoma insitu    Current Outpatient Medications  Medication Sig Dispense Refill   ACCU-CHEK GUIDE test strip TEST UP TO FOUR TIMES DAILY 100 strip 1   amLODipine (NORVASC) 2.5 MG tablet TAKE 1 TABLET(2.5 MG) BY MOUTH DAILY 90 tablet 1   blood glucose meter kit and supplies KIT Dispense based on patient and insurance preference. Use up to four times daily as directed. (FOR ICD-9 250.00, 250.01). 1 each 0   ELIQUIS 5 MG TABS  tablet TAKE 1 TABLET(5 MG) BY MOUTH TWICE DAILY 60 tablet 3   fish oil-omega-3 fatty acids 1000 MG capsule Take 1 g by mouth 2 (two) times daily.      Flaxseed, Linseed, (FLAX SEED OIL) 1000 MG CAPS Take 1,000 mg by mouth in the morning and at bedtime.     FLUoxetine (PROZAC) 40 MG capsule Take 1 capsule (40 mg total) by mouth daily. 30 capsule 3   furosemide (LASIX) 20 MG tablet TAKE 1 TABLET(20 MG) BY MOUTH DAILY 90 tablet 1   GLUCOSAMINE-CHONDROITIN PO Take 1 capsule by mouth 2 (two) times daily.     LORazepam (ATIVAN) 0.5 MG tablet TAKE 1 TABLET(0.5 MG) BY MOUTH EVERY 8 HOURS AS NEEDED FOR ANXIETY 90 tablet 1   lovastatin (MEVACOR) 40 MG tablet TAKE 1 TABLET(40 MG) BY MOUTH AT BEDTIME 90 tablet 1   metFORMIN (GLUCOPHAGE) 500 MG tablet TAKE 1 TABLET(500 MG) BY MOUTH TWICE DAILY WITH A MEAL 180 tablet 1   metoprolol tartrate (LOPRESSOR) 25 MG tablet Take 0.5 tablets (12.5 mg total) by mouth 2 (two) times daily. 60 tablet 11   Multiple Vitamin (MULTIVITAMIN) capsule Take 1 capsule by mouth daily.     NYSTATIN powder APPLY EXTERNALLY TO THE AFFECTED AREA THREE TIMES DAILY AS NEEDED 60 g 5   olmesartan-hydrochlorothiazide (BENICAR HCT) 40-25 MG tablet TAKE 1 TABLET BY MOUTH DAILY 90 tablet 1   polyvinyl alcohol (LIQUIFILM TEARS) 1.4 % ophthalmic solution Place 1 drop into both eyes daily as needed (For dry eyes.).     Semaglutide,0.25 or 0.5MG /DOS, (OZEMPIC, 0.25 OR 0.5 MG/DOSE,) 2 MG/3ML SOPN Inject 0.5 mg into the skin once a week. 1.5 mL 3   tolterodine (DETROL) 2 MG tablet TAKE 1 TABLET(2 MG) BY MOUTH TWICE DAILY 180 tablet 1   No current facility-administered medications for this encounter.    Allergies  Allergen Reactions   Other Swelling    Peaches(fresh) causes throat itching-swelling   Codeine     Nausea & vomiting   Penicillins     angioedema    Social History   Socioeconomic History   Marital status: Married    Spouse name: Not on file   Number of children: Not on file    Years of education: Not on file   Highest education level: Not on file  Occupational History   Occupation: Neurosurgeon    Employer: [employer]  Tobacco Use   Smoking status: Never   Smokeless tobacco: Never   Tobacco comments:    Never smoke 09/22/22  Substance and Sexual Activity   Alcohol use: Yes    Alcohol/week: 7.0 standard drinks of alcohol    Types: 7 Glasses of wine per week    Comment: 1 glass of wine nightly 09/22/22   Drug use: No   Sexual activity: Yes  Partners: Male  Other Topics Concern   Not on file  Social History Narrative   Exercise-- not right now   Social Determinants of Health   Financial Resource Strain: Not on file  Food Insecurity: Not on file  Transportation Needs: Not on file  Physical Activity: Not on file  Stress: Not on file  Social Connections: Not on file  Intimate Partner Violence: Not on file     ROS- All systems are reviewed and negative except as per the HPI above.  Physical Exam: Vitals:   02/13/23 1445  BP: 114/70  Pulse: (!) 51  Weight: (!) 152.6 kg  Height: 5\' 9"  (1.753 m)    GEN- The patient is a well appearing female, alert and oriented x 3 today.   HEENT-head normocephalic, atraumatic, sclera clear, conjunctiva pink, hearing intact, trachea midline. Lungs- Clear to ausculation bilaterally, normal work of breathing Heart- Regular rate and rhythm, no murmurs, rubs or gallops  GI- soft, NT, ND, + BS Extremities- no clubbing, cyanosis, or edema MS- no significant deformity or atrophy Skin- no rash or lesion Psych- euthymic mood, full affect Neuro- strength and sensation are intact   Wt Readings from Last 3 Encounters:  02/13/23 (!) 152.6 kg  02/06/23 (!) 154.2 kg  11/10/22 (!) 155.4 kg    EKG today demonstrates  SB Vent. rate 51 BPM PR interval 144 ms QRS duration 86 ms QT/QTcB 464/427 ms  Echo 11/10/22 demonstrated   1. Left ventricular ejection fraction, by estimation, is 60 to 65%. The   left ventricle has normal function. The left ventricle has no regional  wall motion abnormalities. Left ventricular diastolic parameters were  normal. The average left ventricular global longitudinal strain is -23.0 %. The global longitudinal strain is normal.   2. Right ventricular systolic function is normal. The right ventricular  size is normal. Tricuspid regurgitation signal is inadequate for assessing  PA pressure.   3. No evidence of mitral valve regurgitation.   4. The aortic valve is grossly normal. Aortic valve regurgitation is not  visualized.   5. The inferior vena cava is normal in size with greater than 50%  respiratory variability, suggesting right atrial pressure of 3 mmHg.   Conclusion(s)/Recommendation(s): Normal biventricular function without  evidence of hemodynamically significant valvular heart disease.   Epic records are reviewed at length today  CHA2DS2-VASc Score = 3  The patient's score is based upon: CHF History: 0 HTN History: 1 Diabetes History: 1 Stroke History: 0 Vascular Disease History: 0 Age Score: 0 Gender Score: 1       ASSESSMENT AND PLAN: 1. Paroxysmal Atrial Fibrillation (ICD10:  I48.0) The patient's CHA2DS2-VASc score is 3, indicating a 3.2% annual risk of stroke.   Patient in SR today, having infrequent episodes. We discussed starting AAD (flecainide). She would like to continue her present therapy and only consider AAD if she has a significant increase in her afib burden.  She has a Optician, dispensing mobile for home monitoring.  Not a candidate for ablation with elevated BMI. Continue Eliquis 5 mg BID Continue Lopressor 12.5 mg BID   2. Secondary Hypercoagulable State (ICD10:  D68.69) The patient is at significant risk for stroke/thromboembolism based upon her CHA2DS2-VASc Score of 3.  Continue Apixaban (Eliquis).   3. Obesity Body mass index is 49.68 kg/m. Lifestyle modification was discussed and encouraged including regular physical  activity and weight reduction. On Ozempic, weight starting to trend down. She is going to the [facility] pool with her husband.  4.  Obstructive sleep apnea Patient followed by Dr Frances Furbish  Encouraged compliance with CPAP therapy.  5. HTN Stable, no changes today.   Follow up with Dr Elease Hashimoto as scheduled to establish care with cardiology. AF clinic in one year.    Jorja Loa PA-C [facility] [address] [place], [place] [address] [phone] 02/13/2023 3:09 PM

## 2023-02-24 ENCOUNTER — Other Ambulatory Visit: Payer: Self-pay | Admitting: Family Medicine

## 2023-02-27 ENCOUNTER — Encounter: Payer: Self-pay | Admitting: Cardiovascular Disease

## 2023-02-27 NOTE — Progress Notes (Unsigned)
Cardiology Office Note:    Date:  03/01/2023   ID:  [name], DOB 1961/07/23, MRN [id number]  PCP:  Zola Button, Grayling Congress, DO   [facility] Providers Cardiologist:  Dedric Ethington  Click to update primary MD,subspecialty MD or APP then REFRESH:1}    Referring MD: Danice Goltz, PA   Chief Complaint  Patient presents with   Atrial Fibrillation          History of Present Illness:    Pam Phillips is a 62 y.o. female with a hx of HTN, HLD, OSA , obesity, atrial fib Pt was diagnosed with PAF - first observed during colonoscopy,  Feels some palpitations on rare occasions  She thought the previous episodes were due to anxiety  Has OSA, wears her CPAP Wine 1 glass each night   Obesity  Has lost 40 lbs   CHADS2VASC  is 3    ( female, HTN, DM)   Swims occasionally ,  Joined the [employer] recently   No Cp , no dyspnea  Worked at [employer],  Now does remote work for a Scientist, research (physical sciences) is 334 lbs     Past Medical History:  Diagnosis Date   Ankle swelling 07/04/2012   mostly left ankle -wears compression hose occ.   Anxiety    Cancer (HCC) 07/04/2012   dx. Melanoma in situ -back(mid-scapula area)   Cataract    Heart murmur    Hyperlipidemia    Hypertension    Sleep apnea     Past Surgical History:  Procedure Laterality Date   CHOLECYSTECTOMY  07-04-12   '03-Lap. due to gallstones-[place]   COLONOSCOPY N/A 08/18/2013   Procedure: COLONOSCOPY;  Surgeon: Beverley Fiedler, MD;  Location: [facility];  Service: Gastroenterology;  Laterality: N/A;   DENTAL SURGERY  07-04-12   2 wisdom teeth and 2 other teeth   MELANOMA EXCISION  07/10/2012   Procedure: MELANOMA EXCISION;  Surgeon: Almond Lint, MD;  Location: [facility];  Service: General;  Laterality: N/A;  excision of back melanoma insitu    Current Medications: Current Meds  Medication Sig   ACCU-CHEK GUIDE test strip USE FOUR TIMES DAILY AS DIRECTED   amLODipine (NORVASC)  2.5 MG tablet TAKE 1 TABLET(2.5 MG) BY MOUTH DAILY   blood glucose meter kit and supplies KIT Dispense based on patient and insurance preference. Use up to four times daily as directed. (FOR ICD-9 250.00, 250.01).   ELIQUIS 5 MG TABS tablet TAKE 1 TABLET(5 MG) BY MOUTH TWICE DAILY   fish oil-omega-3 fatty acids 1000 MG capsule Take 1 g by mouth 2 (two) times daily.    Flaxseed, Linseed, (FLAX SEED OIL) 1000 MG CAPS Take 1,000 mg by mouth in the morning and at bedtime.   FLUoxetine (PROZAC) 40 MG capsule Take 1 capsule (40 mg total) by mouth daily.   furosemide (LASIX) 20 MG tablet TAKE 1 TABLET(20 MG) BY MOUTH DAILY   GLUCOSAMINE-CHONDROITIN PO Take 1 capsule by mouth 2 (two) times daily.   LORazepam (ATIVAN) 0.5 MG tablet TAKE 1 TABLET(0.5 MG) BY MOUTH EVERY 8 HOURS AS NEEDED FOR ANXIETY   lovastatin (MEVACOR) 40 MG tablet TAKE 1 TABLET(40 MG) BY MOUTH AT BEDTIME   metFORMIN (GLUCOPHAGE) 500 MG tablet TAKE 1 TABLET(500 MG) BY MOUTH TWICE DAILY WITH A MEAL   metoprolol tartrate (LOPRESSOR) 25 MG tablet Take 0.5 tablets (12.5 mg total) by mouth 2 (two) times daily.   Multiple Vitamin (MULTIVITAMIN) capsule Take  1 capsule by mouth daily.   NYSTATIN powder APPLY EXTERNALLY TO THE AFFECTED AREA THREE TIMES DAILY AS NEEDED   olmesartan-hydrochlorothiazide (BENICAR HCT) 40-25 MG tablet TAKE 1 TABLET BY MOUTH DAILY   polyvinyl alcohol (LIQUIFILM TEARS) 1.4 % ophthalmic solution Place 1 drop into both eyes daily as needed (For dry eyes.).   Semaglutide,0.25 or 0.5MG /DOS, (OZEMPIC, 0.25 OR 0.5 MG/DOSE,) 2 MG/3ML SOPN Inject 0.5 mg into the skin once a week.   tolterodine (DETROL) 2 MG tablet TAKE 1 TABLET(2 MG) BY MOUTH TWICE DAILY     Allergies:   Other, Codeine, and Penicillins   Social History   Socioeconomic History   Marital status: Married    Spouse name: Not on file   Number of children: Not on file   Years of education: Not on file   Highest education level: Not on file  Occupational  History   Occupation: Neurosurgeon    Employer: [employer]  Tobacco Use   Smoking status: Never   Smokeless tobacco: Never   Tobacco comments:    Never smoke 09/22/22  Substance and Sexual Activity   Alcohol use: Yes    Alcohol/week: 7.0 standard drinks of alcohol    Types: 7 Glasses of wine per week    Comment: 1 glass of wine nightly 09/22/22   Drug use: No   Sexual activity: Yes    Partners: Male  Other Topics Concern   Not on file  Social History Narrative   Exercise-- not right now   Social Determinants of Health   Financial Resource Strain: Not on file  Food Insecurity: Not on file  Transportation Needs: Not on file  Physical Activity: Not on file  Stress: Not on file  Social Connections: Not on file     Family History: The patient's family history includes Alzheimer's disease in her mother; COPD in her father; Cancer (age of onset: 62) in her father; Colon cancer in her mother; Colon polyps in her mother; Coronary artery disease (age of onset: 76) in an other family member; Heart disease (age of onset: 41) in her father; Hypertension in her father. There is no history of Pancreatic cancer, Stomach cancer, Sleep apnea, Esophageal cancer, or Rectal cancer.  ROS:   Please see the history of present illness.     All other systems reviewed and are negative.  EKGs/Labs/Other Studies Reviewed:    The following studies were reviewed today:    EKG:  April ,2024  sinus brady at 51  no ST or T wave changes.   Recent Labs: 09/22/2022: Magnesium 2.0; TSH 2.072 09/29/2022: ALT 18 11/10/2022: BUN 15; Creatinine, Ser 0.72; Hemoglobin 12.1; Platelets 177; Potassium 3.8; Sodium 138  Recent Lipid Panel    Component Value Date/Time   CHOL 134 09/29/2022 1041   TRIG 125.0 09/29/2022 1041   HDL 46.00 09/29/2022 1041   CHOLHDL 3 09/29/2022 1041   VLDL 25.0 09/29/2022 1041   LDLCALC 63 09/29/2022 1041   LDLCALC 86 06/29/2020 1046     Risk  Assessment/Calculations:    CHA2DS2-VASc Score = 3  This indicates a 3.2% annual risk of stroke. The patient's score is based upon: CHF History: 0 HTN History: 1 Diabetes History: 1 Stroke History: 0 Vascular Disease History: 0 Age Score: 0 Gender Score: 1            Physical Exam:    VS:  BP 132/68   Pulse (!) 53   Ht 5' 8.5" (1.74 m)  Wt (!) 334 lb 12.8 oz (151.9 kg)   LMP 06/26/2012   SpO2 94%   BMI 50.17 kg/m     Wt Readings from Last 3 Encounters:  02/28/23 (!) 334 lb 12.8 oz (151.9 kg)  02/13/23 (!) 336 lb 6.4 oz (152.6 kg)  02/06/23 (!) 340 lb (154.2 kg)     GEN:  Well nourished, well developed in no acute distress HEENT: Normal NECK: No JVD; No carotid bruits LYMPHATICS: No lymphadenopathy CARDIAC: RR , soft systolic murmur at LSB radiating to Left ax line  RESPIRATORY:  Clear to auscultation without rales, wheezing or rhonchi  ABDOMEN: Soft, non-tender, non-distended MUSCULOSKELETAL:  No edema; No deformity  SKIN: Warm and dry NEUROLOGIC:  Alert and oriented x 3 PSYCHIATRIC:  Normal affect   ASSESSMENT:    1. Paroxysmal atrial fibrillation (HCC)    PLAN:       PAF :  is back in NSR at this point .   Is on Eliquis .  At this time there is not much else to do but observe.  She has converted back to NSR.   She may have episodes of PAF .  She is on metoprolol   She needs to have colonoscopy.   She may hold her Eliquis for 2 days prior to colonoscopy.   Restart as soon as it is safe from a GI standpoint.    She is at low risk for her procedure      Will see her in 1 year .       Medication Adjustments/Labs and Tests Ordered: Current medicines are reviewed at length with the patient today.  Concerns regarding medicines are outlined above.  No orders of the defined types were placed in this encounter.  No orders of the defined types were placed in this encounter.   Patient Instructions  Medication Instructions:  Your physician recommends that  you continue on your current medications as directed. Please refer to the Current Medication list given to you today.  *If you need a refill on your cardiac medications before your next appointment, please call your pharmacy*  Follow-Up: At [facility], you and your health needs are our priority.  As part of our continuing mission to provide you with exceptional heart care, we have created designated Provider Care Teams.  These Care Teams include your primary Cardiologist (physician) and Advanced Practice Providers (APPs -  Physician Assistants and Nurse Practitioners) who all work together to provide you with the care you need, when you need it.  Your next appointment:   1 year(s)  Provider:   Dr. Delane Ginger   Signed, Kristeen Miss, MD  03/01/2023 11:59 AM    [facility]

## 2023-02-28 ENCOUNTER — Ambulatory Visit: Payer: BC Managed Care – PPO | Attending: Interventional Cardiology | Admitting: Cardiovascular Disease

## 2023-02-28 ENCOUNTER — Encounter: Payer: Self-pay | Admitting: Cardiovascular Disease

## 2023-02-28 VITALS — BP 132/68 | HR 53 | Ht 68.5 in | Wt 334.8 lb

## 2023-02-28 DIAGNOSIS — I480 Paroxysmal atrial fibrillation: Secondary | ICD-10-CM | POA: Diagnosis not present

## 2023-02-28 NOTE — Patient Instructions (Signed)
Medication Instructions:  Your physician recommends that you continue on your current medications as directed. Please refer to the Current Medication list given to you today.  *If you need a refill on your cardiac medications before your next appointment, please call your pharmacy*  Follow-Up: At [facility], you and your health needs are our priority.  As part of our continuing mission to provide you with exceptional heart care, we have created designated Provider Care Teams.  These Care Teams include your primary Cardiologist (physician) and Advanced Practice Providers (APPs -  Physician Assistants and Nurse Practitioners) who all work together to provide you with the care you need, when you need it.  Your next appointment:   1 year(s)  Provider:   Dr. Delane Ginger

## 2023-03-01 NOTE — Telephone Encounter (Signed)
I have left a message for patient to call back. She needs hospital colonoscopy.

## 2023-03-01 NOTE — Telephone Encounter (Signed)
Request for surgical clearance:     Endoscopy Procedure  What type of surgery is being performed?     colonoscopy  When is this surgery scheduled?     TBD  What type of clearance is required ?   Pharmacy AND medical  Are there any medications that need to be held prior to surgery and how long? Eliquis, 2 days (previously given hold clearance back in 10/2022)  Practice name and name of physician performing surgery?      [facility]  What is your office phone and fax number?      Phone- [phone]  Fax- [phone]  Anesthesia type (None, local, MAC, general) ?       MAC

## 2023-03-01 NOTE — Telephone Encounter (Signed)
   Primary Cardiologist: None  Chart reviewed as part of pre-operative protocol coverage. Given past medical history and time since last visit, based on ACC/AHA guidelines, Izna Kneer would be at acceptable risk for the planned procedure without further cardiovascular testing.   Patient was advised that if she develops new symptoms prior to surgery to contact our office to arrange a follow-up appointment.  He verbalized understanding.  Per office protocol, she may hold Eliquis for 2 days prior to procedure.  She should resume as soon as hemodynamically stable following the procedure.  I will route this recommendation to the requesting party via Epic fax function and remove from pre-op pool.  Please call with questions.  Levi Aland, NP-C  03/01/2023, 12:20 PM [address] Office [phone] Fax [phone]

## 2023-03-02 NOTE — Telephone Encounter (Signed)
Left message for patient to call back  

## 2023-03-05 NOTE — Telephone Encounter (Signed)
Left additional message for patient to call back. As I have been unable to reach her x 3, I will also send a letter to her home address informing her that we are trying to reach her to reschedule her hospital procedure.

## 2023-03-12 IMAGING — MG MM DIGITAL SCREENING BILAT W/ TOMO AND CAD
8 series · 8 of 24 positions shown · non-contrast
Comparison: Previous exam(s).

CLINICAL DATA: Screening.

EXAM:
DIGITAL SCREENING BILATERAL MAMMOGRAM WITH TOMOSYNTHESIS AND CAD
TECHNIQUE: Bilateral screening digital craniocaudal and mediolateral oblique
mammograms were obtained. Bilateral screening digital breast
tomosynthesis was performed. The images were evaluated with
computer-aided detection.

[R CC synth-2D]
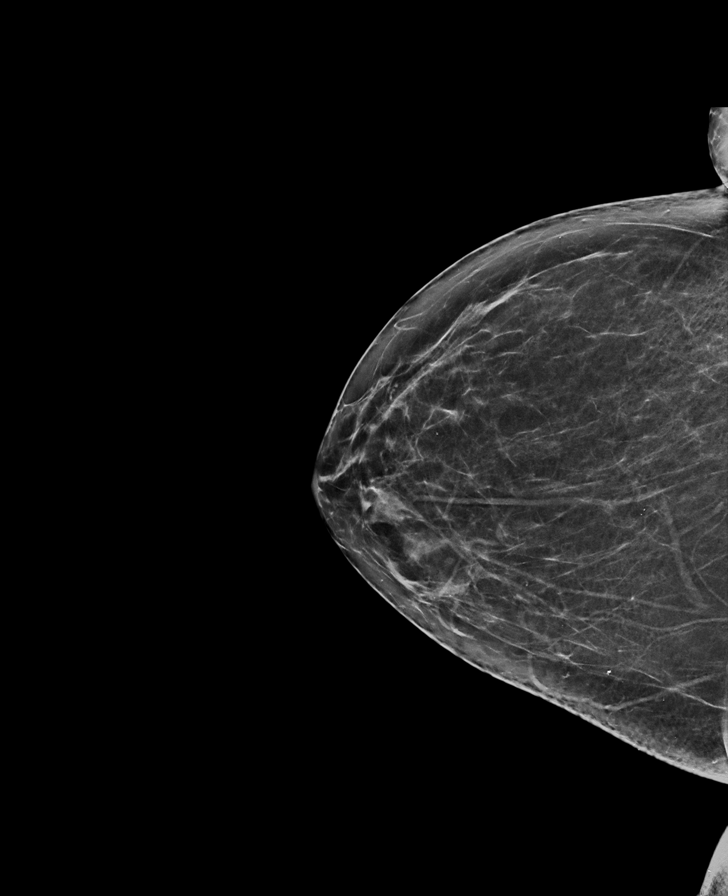

[L CC synth-2D]
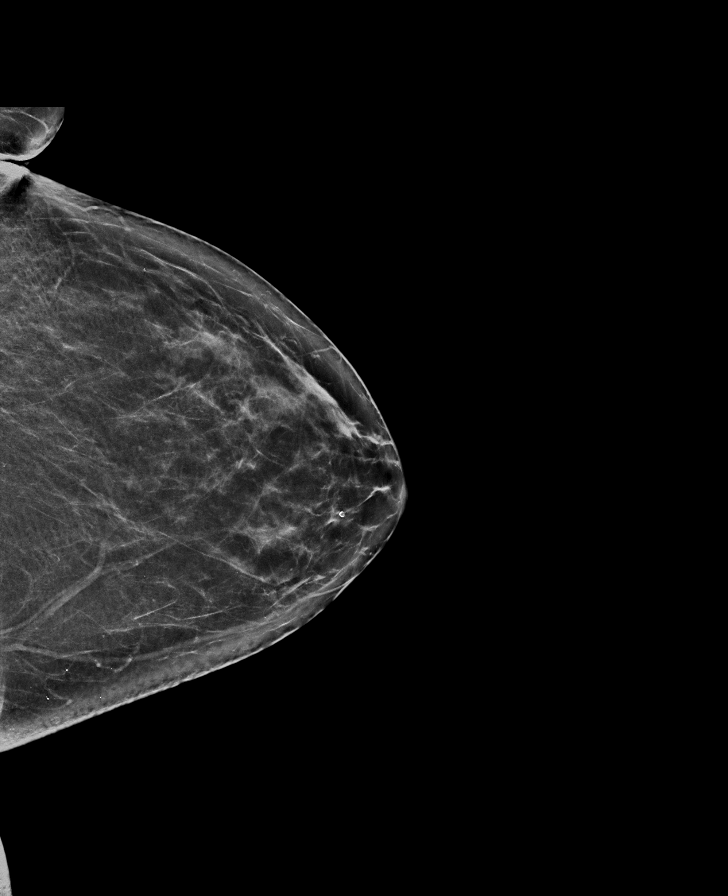

[L MLO synth-2D]
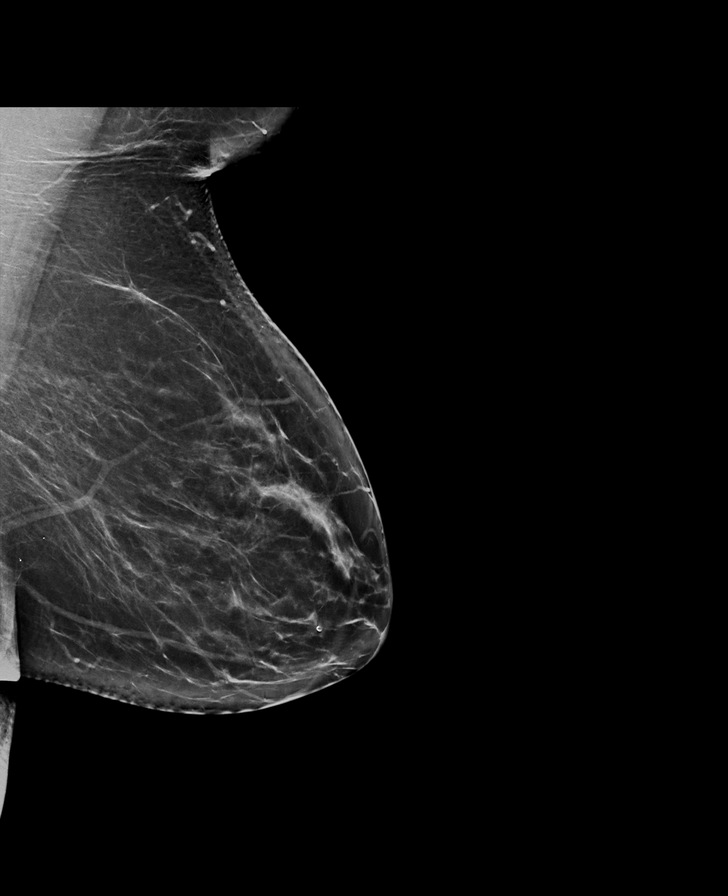

[R MLO synth-2D]
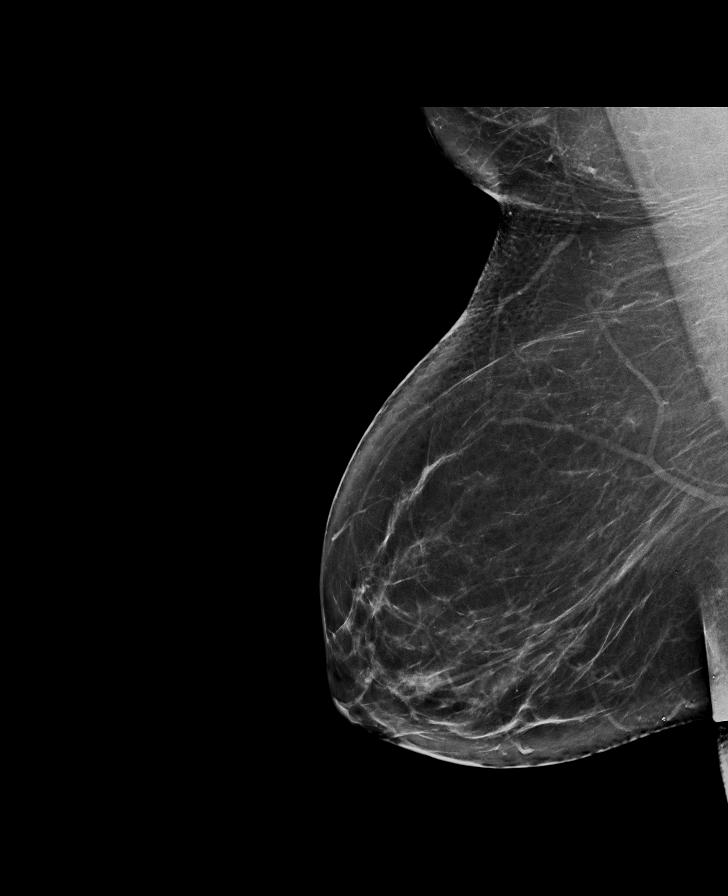

[R MLO tomo · tomo slice 40/79.0]
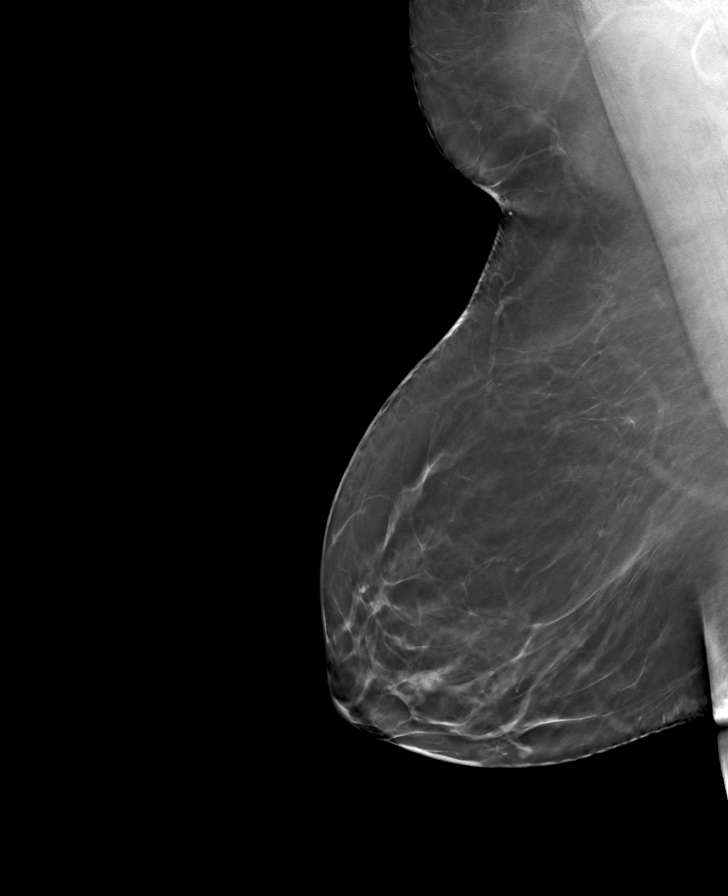

[L MLO tomo · tomo slice 39/76.0]
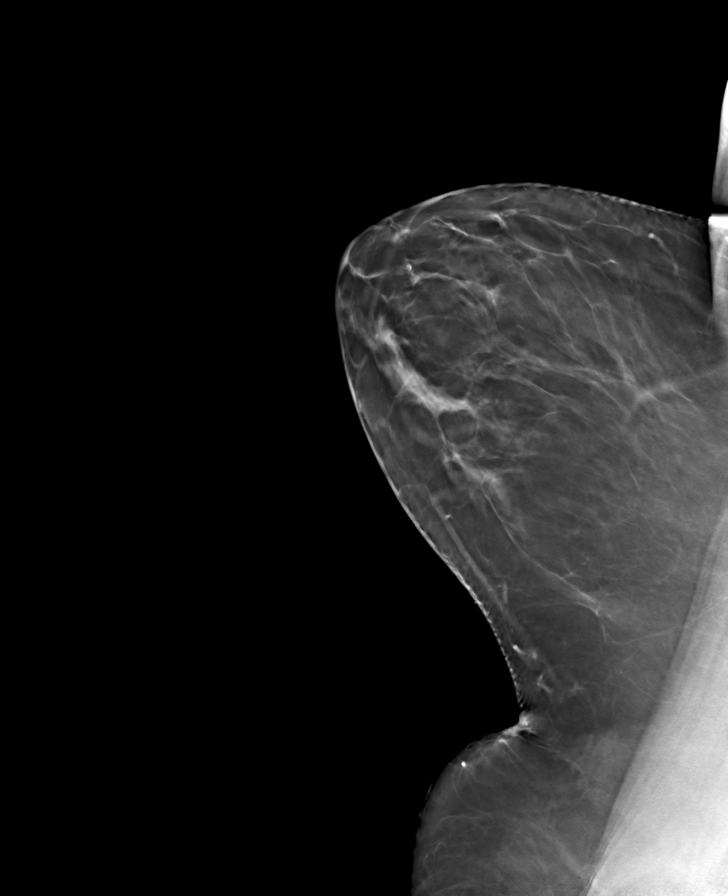

[R CC tomo · tomo slice 33/65.0]
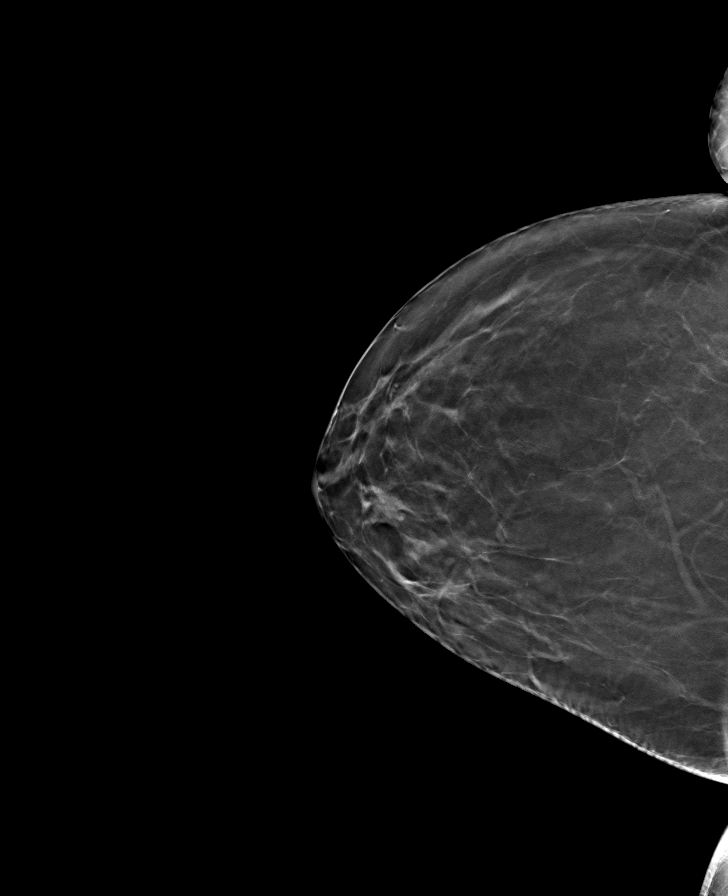

[L CC tomo · tomo slice 34/67.0]
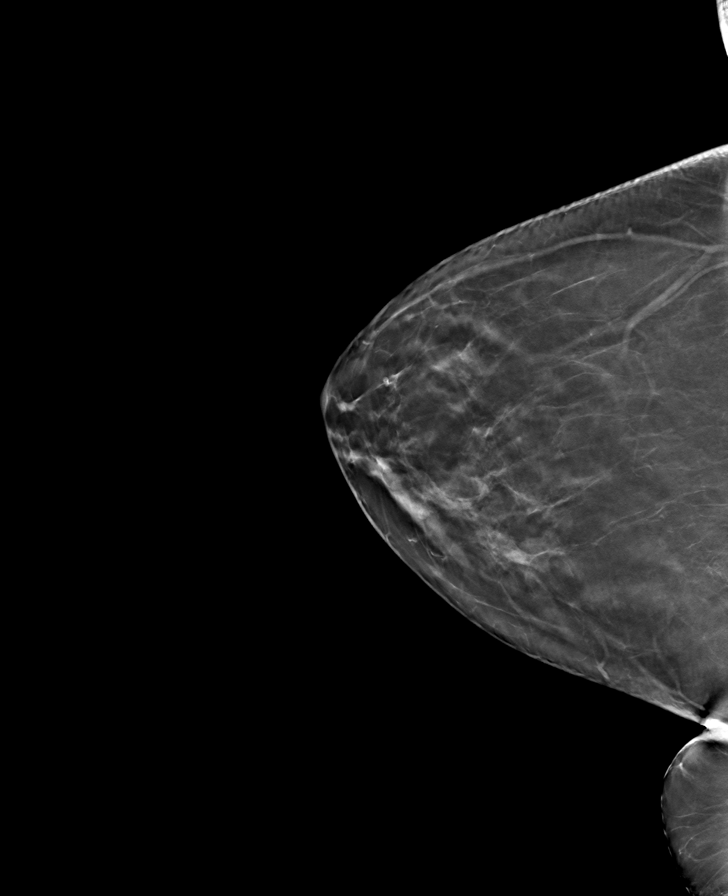

[8 of 24 positions shown; findings below may reference images not displayed]

ACR Breast Density Category b: There are scattered areas of
fibroglandular density.
FINDINGS: There are no findings suspicious for malignancy. The images were
evaluated with computer-aided detection.
IMPRESSION: No mammographic evidence of malignancy. A result letter of this
screening mammogram will be mailed directly to the patient.

RECOMMENDATION:
Screening mammogram in one year. (Code:[id number])

BI-RADS CATEGORY  1: Negative.

## 2023-03-13 ENCOUNTER — Telehealth: Payer: BC Managed Care – PPO | Admitting: Family Medicine

## 2023-03-19 ENCOUNTER — Telehealth (INDEPENDENT_AMBULATORY_CARE_PROVIDER_SITE_OTHER): Payer: BC Managed Care – PPO | Admitting: Family Medicine

## 2023-03-19 ENCOUNTER — Encounter: Payer: Self-pay | Admitting: Family Medicine

## 2023-03-19 VITALS — Ht 68.5 in

## 2023-03-19 DIAGNOSIS — F32A Depression, unspecified: Secondary | ICD-10-CM | POA: Diagnosis not present

## 2023-03-19 DIAGNOSIS — F419 Anxiety disorder, unspecified: Secondary | ICD-10-CM | POA: Diagnosis not present

## 2023-03-19 MED ORDER — FLUOXETINE HCL 40 MG PO CAPS: 40.0000 mg | ORAL_CAPSULE | Freq: Every day | ORAL | 3 refills | Status: DC

## 2023-03-19 NOTE — Assessment & Plan Note (Signed)
Con't prozac 40 mg and as needed lorazepam Pt has regular f/u next month

## 2023-03-19 NOTE — Progress Notes (Signed)
MyChart Video Visit    Virtual Visit via Video Note   This visit type was conducted due to national recommendations for restrictions regarding the COVID-19 Pandemic (e.g. social distancing) in an effort to limit this patient's exposure and mitigate transmission in our community. This patient is at least at moderate risk for complications without adequate follow up. This format is felt to be most appropriate for this patient at this time. Physical exam was limited by quality of the video and audio technology used for the visit. [name] was able to get the patient set up on a video visit.  Patient location: home Patient and provider in visit Provider location: Office  I discussed the limitations of evaluation and management by telemedicine and the availability of in person appointments. The patient expressed understanding and agreed to proceed.  Visit Date: 03/19/2023  Today's healthcare provider: Donato Schultz, DO     Subjective:    Patient ID: Pam Phillips, female    DOB: 01-Mar-1961, 62 y.o.   MRN: [id number]  Chief Complaint  Patient presents with   Follow-up    No concerns     HPI Patient is in today for f/u depression/ anxiety.  She is doing well with the 40 mg prozac.  Her mother passed away last week-- she had dementia  No new complaints.    Past Medical History:  Diagnosis Date   Ankle swelling 07/04/2012   mostly left ankle -wears compression hose occ.   Anxiety    Cancer (HCC) 07/04/2012   dx. Melanoma in situ -back(mid-scapula area)   Cataract    Heart murmur    Hyperlipidemia    Hypertension    Sleep apnea     Past Surgical History:  Procedure Laterality Date   CHOLECYSTECTOMY  07-04-12   '03-Lap. due to gallstones-[place]   COLONOSCOPY N/A 08/18/2013   Procedure: COLONOSCOPY;  Surgeon: Beverley Fiedler, MD;  Location: [facility];  Service: Gastroenterology;  Laterality: N/A;   DENTAL SURGERY  07-04-12   2 wisdom teeth and 2 other  teeth   MELANOMA EXCISION  07/10/2012   Procedure: MELANOMA EXCISION;  Surgeon: Almond Lint, MD;  Location: [facility];  Service: General;  Laterality: N/A;  excision of back melanoma insitu    Family History  Problem Relation Age of Onset   Colon polyps Mother    Colon cancer Mother    Alzheimer's disease Mother    Hypertension Father    COPD Father    Heart disease Father 82       MI   Cancer Father 10       prostate ca   Coronary artery disease Other 22       1st degree female   Pancreatic cancer Neg Hx    Stomach cancer Neg Hx    Sleep apnea Neg Hx    Esophageal cancer Neg Hx    Rectal cancer Neg Hx     Social History   Socioeconomic History   Marital status: Married    Spouse name: Not on file   Number of children: Not on file   Years of education: Not on file   Highest education level: Not on file  Occupational History   Occupation: Neurosurgeon    Employer: [employer]  Tobacco Use   Smoking status: Never   Smokeless tobacco: Never   Tobacco comments:    Never smoke 09/22/22  Substance and Sexual Activity   Alcohol use: Yes  Alcohol/week: 7.0 standard drinks of alcohol    Types: 7 Glasses of wine per week    Comment: 1 glass of wine nightly 09/22/22   Drug use: No   Sexual activity: Yes    Partners: Male  Other Topics Concern   Not on file  Social History Narrative   Exercise-- not right now   Social Determinants of Health   Financial Resource Strain: Not on file  Food Insecurity: Not on file  Transportation Needs: Not on file  Physical Activity: Not on file  Stress: Not on file  Social Connections: Not on file  Intimate Partner Violence: Not on file    Outpatient Medications Prior to Visit  Medication Sig Dispense Refill   ACCU-CHEK GUIDE test strip USE FOUR TIMES DAILY AS DIRECTED 100 strip 1   amLODipine (NORVASC) 2.5 MG tablet TAKE 1 TABLET(2.5 MG) BY MOUTH DAILY 90 tablet 1   blood glucose meter kit and supplies KIT Dispense  based on patient and insurance preference. Use up to four times daily as directed. (FOR ICD-9 250.00, 250.01). 1 each 0   ELIQUIS 5 MG TABS tablet TAKE 1 TABLET(5 MG) BY MOUTH TWICE DAILY 60 tablet 3   fish oil-omega-3 fatty acids 1000 MG capsule Take 1 g by mouth 2 (two) times daily.      Flaxseed, Linseed, (FLAX SEED OIL) 1000 MG CAPS Take 1,000 mg by mouth in the morning and at bedtime.     furosemide (LASIX) 20 MG tablet TAKE 1 TABLET(20 MG) BY MOUTH DAILY 90 tablet 1   GLUCOSAMINE-CHONDROITIN PO Take 1 capsule by mouth 2 (two) times daily.     LORazepam (ATIVAN) 0.5 MG tablet TAKE 1 TABLET(0.5 MG) BY MOUTH EVERY 8 HOURS AS NEEDED FOR ANXIETY 90 tablet 1   lovastatin (MEVACOR) 40 MG tablet TAKE 1 TABLET(40 MG) BY MOUTH AT BEDTIME 90 tablet 1   metFORMIN (GLUCOPHAGE) 500 MG tablet TAKE 1 TABLET(500 MG) BY MOUTH TWICE DAILY WITH A MEAL 180 tablet 1   metoprolol tartrate (LOPRESSOR) 25 MG tablet Take 0.5 tablets (12.5 mg total) by mouth 2 (two) times daily. 60 tablet 11   Multiple Vitamin (MULTIVITAMIN) capsule Take 1 capsule by mouth daily.     NYSTATIN powder APPLY EXTERNALLY TO THE AFFECTED AREA THREE TIMES DAILY AS NEEDED 60 g 5   olmesartan-hydrochlorothiazide (BENICAR HCT) 40-25 MG tablet TAKE 1 TABLET BY MOUTH DAILY 90 tablet 1   polyvinyl alcohol (LIQUIFILM TEARS) 1.4 % ophthalmic solution Place 1 drop into both eyes daily as needed (For dry eyes.).     Semaglutide,0.25 or 0.5MG /DOS, (OZEMPIC, 0.25 OR 0.5 MG/DOSE,) 2 MG/3ML SOPN Inject 0.5 mg into the skin once a week. 1.5 mL 3   tolterodine (DETROL) 2 MG tablet TAKE 1 TABLET(2 MG) BY MOUTH TWICE DAILY 180 tablet 1   FLUoxetine (PROZAC) 40 MG capsule Take 1 capsule (40 mg total) by mouth daily. 30 capsule 3   No facility-administered medications prior to visit.    Allergies  Allergen Reactions   Other Swelling    Peaches(fresh) causes throat itching-swelling   Codeine     Nausea & vomiting   Penicillins     angioedema     Review of Systems  Constitutional:  Negative for fever and malaise/fatigue.  HENT:  Negative for congestion.   Eyes:  Negative for blurred vision.  Respiratory:  Negative for cough and shortness of breath.   Cardiovascular:  Negative for chest pain, palpitations and leg swelling.  Gastrointestinal:  Negative  for vomiting.  Musculoskeletal:  Negative for back pain.  Skin:  Negative for rash.  Neurological:  Negative for loss of consciousness and headaches.       Objective:    Physical Exam Vitals and nursing note reviewed.  Constitutional:      Appearance: Normal appearance. She is obese.  Neurological:     Mental Status: She is alert.  Psychiatric:        Attention and Perception: Attention and perception normal.        Mood and Affect: Mood and affect normal.        Speech: Speech normal.        Behavior: Behavior normal. Behavior is cooperative.        Thought Content: Thought content normal. Thought content is not paranoid or delusional. Thought content does not include homicidal or suicidal ideation. Thought content does not include homicidal or suicidal plan.        Cognition and Memory: Cognition and memory normal.     Ht 5' 8.5" (1.74 m)   LMP 06/26/2012   BMI 50.17 kg/m  Wt Readings from Last 3 Encounters:  02/28/23 (!) 334 lb 12.8 oz (151.9 kg)  02/13/23 (!) 336 lb 6.4 oz (152.6 kg)  02/06/23 (!) 340 lb (154.2 kg)       Assessment & Plan:  Anxiety and depression Assessment & Plan: Con't prozac 40 mg and as needed lorazepam Pt has regular f/u next month  Orders: -     FLUoxetine HCl; Take 1 capsule (40 mg total) by mouth daily.  Dispense: 90 capsule; Refill: 3     I discussed the assessment and treatment plan with the patient. The patient was provided an opportunity to ask questions and all were answered. The patient agreed with the plan and demonstrated an understanding of the instructions.   The patient was advised to call back or seek an  in-person evaluation if the symptoms worsen or if the condition fails to improve as anticipated.  Donato Schultz, DO [facility] at [facility] [phone] (phone) [phone] (fax)  [facility]

## 2023-04-06 ENCOUNTER — Encounter: Payer: BC Managed Care – PPO | Admitting: Family Medicine

## 2023-04-13 ENCOUNTER — Encounter: Payer: Self-pay | Admitting: Family Medicine

## 2023-04-13 ENCOUNTER — Ambulatory Visit: Payer: BC Managed Care – PPO | Admitting: Family Medicine

## 2023-04-13 VITALS — BP 138/70 | HR 46 | Temp 97.8°F | Resp 18 | Ht 68.5 in | Wt 336.8 lb

## 2023-04-13 DIAGNOSIS — F411 Generalized anxiety disorder: Secondary | ICD-10-CM | POA: Diagnosis not present

## 2023-04-13 DIAGNOSIS — E785 Hyperlipidemia, unspecified: Secondary | ICD-10-CM

## 2023-04-13 DIAGNOSIS — Z7985 Long-term (current) use of injectable non-insulin antidiabetic drugs: Secondary | ICD-10-CM | POA: Diagnosis not present

## 2023-04-13 DIAGNOSIS — E119 Type 2 diabetes mellitus without complications: Secondary | ICD-10-CM

## 2023-04-13 DIAGNOSIS — E1165 Type 2 diabetes mellitus with hyperglycemia: Secondary | ICD-10-CM

## 2023-04-13 DIAGNOSIS — E1169 Type 2 diabetes mellitus with other specified complication: Secondary | ICD-10-CM | POA: Diagnosis not present

## 2023-04-13 DIAGNOSIS — F419 Anxiety disorder, unspecified: Secondary | ICD-10-CM | POA: Diagnosis not present

## 2023-04-13 DIAGNOSIS — I10 Essential (primary) hypertension: Secondary | ICD-10-CM | POA: Diagnosis not present

## 2023-04-13 DIAGNOSIS — Z0000 Encounter for general adult medical examination without abnormal findings: Secondary | ICD-10-CM

## 2023-04-13 DIAGNOSIS — I480 Paroxysmal atrial fibrillation: Secondary | ICD-10-CM

## 2023-04-13 DIAGNOSIS — F32A Depression, unspecified: Secondary | ICD-10-CM

## 2023-04-13 LAB — COMPREHENSIVE METABOLIC PANEL
ALT: 17 U/L (ref 0–35)
AST: 14 U/L (ref 0–37)
Albumin: 4.1 g/dL (ref 3.5–5.2)
Alkaline Phosphatase: 54 U/L (ref 39–117)
BUN: 15 mg/dL (ref 6–23)
CO2: 30 mEq/L (ref 19–32)
Calcium: 9.4 mg/dL (ref 8.4–10.5)
Chloride: 103 mEq/L (ref 96–112)
Creatinine, Ser: 0.62 mg/dL (ref 0.40–1.20)
GFR: 95.16 mL/min (ref >60.00)
Glucose, Bld: 100 mg/dL — ABNORMAL HIGH (ref 70–99)
Potassium: 4 mEq/L (ref 3.5–5.1)
Sodium: 140 mEq/L (ref 135–145)
Total Bilirubin: 1 mg/dL (ref 0.2–1.2)
Total Protein: 6.6 g/dL (ref 6.0–8.3)

## 2023-04-13 LAB — CBC WITH DIFFERENTIAL/PLATELET
Basophils Absolute: 0.1 10*3/uL (ref 0.0–0.1)
Basophils Relative: 0.7 % (ref 0.0–3.0)
Eosinophils Absolute: 0.1 10*3/uL (ref 0.0–0.7)
Eosinophils Relative: 1.3 % (ref 0.0–5.0)
HCT: 36.2 % (ref 36.0–46.0)
Hemoglobin: 11.9 g/dL — ABNORMAL LOW (ref 12.0–15.0)
Lymphocytes Relative: 21.9 % (ref 12.0–46.0)
Lymphs Abs: 1.6 10*3/uL (ref 0.7–4.0)
MCHC: 32.9 g/dL (ref 30.0–36.0)
MCV: 95.4 fl (ref 78.0–100.0)
Monocytes Absolute: 0.5 10*3/uL (ref 0.1–1.0)
Monocytes Relative: 7.2 % (ref 3.0–12.0)
Neutro Abs: 5.1 10*3/uL (ref 1.4–7.7)
Neutrophils Relative %: 68.9 % (ref 43.0–77.0)
Platelets: 179 10*3/uL (ref 150.0–400.0)
RBC: 3.8 Mil/uL — ABNORMAL LOW (ref 3.87–5.11)
RDW: 13.4 % (ref 11.5–15.5)
WBC: 7.4 10*3/uL (ref 4.0–10.5)

## 2023-04-13 LAB — MICROALBUMIN / CREATININE URINE RATIO
Creatinine,U: 49.5 mg/dL
Microalb Creat Ratio: 1.4 mg/g (ref 0.0–30.0)
Microalb, Ur: <0.7 mg/dL (ref 0.0–1.9)

## 2023-04-13 LAB — LIPID PANEL
Cholesterol: 134 mg/dL (ref 0–200)
HDL: 45.2 mg/dL (ref >39.00)
LDL Cholesterol: 68 mg/dL (ref 0–99)
NonHDL: 88.71
Total CHOL/HDL Ratio: 3
Triglycerides: 106 mg/dL (ref 0.0–149.0)
VLDL: 21.2 mg/dL (ref 0.0–40.0)

## 2023-04-13 LAB — HEMOGLOBIN A1C: Hgb A1c MFr Bld: 5.4 % (ref 4.6–6.5)

## 2023-04-13 MED ORDER — LORAZEPAM 0.5 MG PO TABS: ORAL_TABLET | ORAL | 1 refills | Status: DC

## 2023-04-13 MED ORDER — SEMAGLUTIDE (1 MG/DOSE) 4 MG/3ML ~~LOC~~ SOPN
1.0000 mg | PEN_INJECTOR | SUBCUTANEOUS | 1 refills | Status: DC
Start: 2023-04-13 — End: 2023-10-01

## 2023-04-13 NOTE — Assessment & Plan Note (Signed)
Well controlled, no changes to meds. Encouraged heart healthy diet such as the DASH diet and exercise as tolerated.  °

## 2023-04-13 NOTE — Assessment & Plan Note (Signed)
hgba1c to be checked minimize simple carbs. Increase exercise as tolerated. Continue current meds 

## 2023-04-13 NOTE — Assessment & Plan Note (Signed)
Doing well with weight loss on mounjaro Diet and exercise

## 2023-04-13 NOTE — Assessment & Plan Note (Signed)
Ghm utd Check labs  See AV[facility] Health Maintenance  Topic Date Due   Colonoscopy  08/18/2018   PAP SMEAR-Modifier  10/08/2021   MAMMOGRAM  02/17/2022   COVID-19 Vaccine (4 - 2023-24 season) 06/16/2022   OPHTHALMOLOGY EXAM  10/26/2022   HEMOGLOBIN A1C  03/31/2023   INFLUENZA VACCINE  05/17/2023   Diabetic kidney evaluation - Urine ACR  09/30/2023   FOOT EXAM  09/30/2023   DEXA SCAN  10/09/2023   Diabetic kidney evaluation - eGFR measurement  11/11/2023   DTaP/Tdap/Td (3 - Td or Tdap) 10/04/2030   Hepatitis C Screening  Completed   HIV Screening  Completed   Zoster Vaccines- Shingrix  Completed   HPV VACCINES  Aged Out

## 2023-04-13 NOTE — Patient Instructions (Signed)
Preventive Care 40-64 Years Old, Female Preventive care refers to lifestyle choices and visits with your health care provider that can promote health and wellness. Preventive care visits are also called wellness exams. What can I expect for my preventive care visit? Counseling Your health care provider may ask you questions about your: Medical history, including: Past medical problems. Family medical history. Pregnancy history. Current health, including: Menstrual cycle. Method of birth control. Emotional well-being. Home life and relationship well-being. Sexual activity and sexual health. Lifestyle, including: Alcohol, nicotine or tobacco, and drug use. Access to firearms. Diet, exercise, and sleep habits. Work and work environment. Sunscreen use. Safety issues such as seatbelt and bike helmet use. Physical exam Your health care provider will check your: Height and weight. These may be used to calculate your BMI (body mass index). BMI is a measurement that tells if you are at a healthy weight. Waist circumference. This measures the distance around your waistline. This measurement also tells if you are at a healthy weight and may help predict your risk of certain diseases, such as type 2 diabetes and high blood pressure. Heart rate and blood pressure. Body temperature. Skin for abnormal spots. What immunizations do I need?  Vaccines are usually given at various ages, according to a schedule. Your health care provider will recommend vaccines for you based on your age, medical history, and lifestyle or other factors, such as travel or where you work. What tests do I need? Screening Your health care provider may recommend screening tests for certain conditions. This may include: Lipid and cholesterol levels. Diabetes screening. This is done by checking your blood sugar (glucose) after you have not eaten for a while (fasting). Pelvic exam and Pap test. Hepatitis B test. Hepatitis C  test. HIV (human immunodeficiency virus) test. STI (sexually transmitted infection) testing, if you are at risk. Lung cancer screening. Colorectal cancer screening. Mammogram. Talk with your health care provider about when you should start having regular mammograms. This may depend on whether you have a family history of breast cancer. BRCA-related cancer screening. This may be done if you have a family history of breast, ovarian, tubal, or peritoneal cancers. Bone density scan. This is done to screen for osteoporosis. Talk with your health care provider about your test results, treatment options, and if necessary, the need for more tests. Follow these instructions at home: Eating and drinking  Eat a diet that includes fresh fruits and vegetables, whole grains, lean protein, and low-fat dairy products. Take vitamin and mineral supplements as recommended by your health care provider. Do not drink alcohol if: Your health care provider tells you not to drink. You are pregnant, may be pregnant, or are planning to become pregnant. If you drink alcohol: Limit how much you have to 0-1 drink a day. Know how much alcohol is in your drink. In the [place] one drink equals one 12 oz bottle of beer (355 mL), one 5 oz glass of wine (148 mL), or one 1 oz glass of hard liquor (44 mL). Lifestyle Brush your teeth every morning and night with fluoride toothpaste. Floss one time each day. Exercise for at least 30 minutes 5 or more days each week. Do not use any products that contain nicotine or tobacco. These products include cigarettes, chewing tobacco, and vaping devices, such as e-cigarettes. If you need help quitting, ask your health care provider. Do not use drugs. If you are sexually active, practice safe sex. Use a condom or other form of protection to   prevent STIs. If you do not wish to become pregnant, use a form of birth control. If you plan to become pregnant, see your health care provider for a  prepregnancy visit. Take aspirin only as told by your health care provider. Make sure that you understand how much to take and what form to take. Work with your health care provider to find out whether it is safe and beneficial for you to take aspirin daily. Find healthy ways to manage stress, such as: Meditation, yoga, or listening to music. Journaling. Talking to a trusted person. Spending time with friends and family. Minimize exposure to UV radiation to reduce your risk of skin cancer. Safety Always wear your seat belt while driving or riding in a vehicle. Do not drive: If you have been drinking alcohol. Do not ride with someone who has been drinking. When you are tired or distracted. While texting. If you have been using any mind-altering substances or drugs. Wear a helmet and other protective equipment during sports activities. If you have firearms in your house, make sure you follow all gun safety procedures. Seek help if you have been physically or sexually abused. What's next? Visit your health care provider once a year for an annual wellness visit. Ask your health care provider how often you should have your eyes and teeth checked. Stay up to date on all vaccines. This information is not intended to replace advice given to you by your health care provider. Make sure you discuss any questions you have with your health care provider. Document Revised: 03/30/2021 Document Reviewed: 03/30/2021 Elsevier Patient Education  2024 Elsevier Inc.  

## 2023-04-13 NOTE — Assessment & Plan Note (Signed)
Doing well on prozac 40mg  daily

## 2023-04-13 NOTE — Progress Notes (Signed)
Established Patient Office Visit  Subjective   Patient ID: Pam Phillips, female    DOB: 03-01-1961  Age: 62 y.o. MRN: [id number]  Chief Complaint  Patient presents with   Diabetes   Hyperlipidemia   Hypertension   Follow-up    HPI Discussed the use of AI scribe software for clinical note transcription with the patient, who gave verbal consent to proceed.  History of Present Illness   The patient presents with fatigue and stress due to a series of recent personal events. She reports feeling tired and has had a rough couple of weeks. She recently lost their mother, who had been in a nursing home with dementia for several years. The patient's mother's passing was not unexpected, but it was sudden, as she got sick one day, went to the hospital for a couple of days, and then went into hospice for a day.  In addition to their mother's passing, the patient's partner fell in the bathroom and broke several bones in their neck, a rib, and a toe. The patient was not home at the time of the fall, and their partner was taken to the hospital. The patient reports that their partner has to wear a neck brace for about three months.  The patient also reports that their dog bit one of their friends who came over to bring food. These events all occurred within one week, which has been very stressful for the patient.  The patient has been checking their blood sugars occasionally, with fasting levels running about 107-110 and post-meal levels varying depending on what they eat. She reports losing about forty pounds over the past year, which they attribute to the medication Ozempic. She is currently on a dose of 0.5mg .  The patient also reports occasional difficulty sleeping, but generally once she falls asleep, she is okay. She is currently on 40mg  of Prozac, which she reports has been helping her feel better.      Patient Active Problem List   Diagnosis Date Noted   Paroxysmal atrial fibrillation  (HCC) 09/22/2022   Hypercoagulable state due to paroxysmal atrial fibrillation (HCC) 09/22/2022   Edema 08/15/2021   Diabetes mellitus type II, non insulin dependent (HCC) 12/02/2019   Acute upper respiratory infection 08/31/2014   Preventative health care 08/18/2013   Benign neoplasm of colon 08/18/2013   Morbid obesity with BMI of 50.0-59.9, adult (HCC) 05/23/2013   Melanoma in situ of back (HCC) 06/18/2012   Snoring 01/02/2011   MORBID OBESITY 07/04/2010   Hyperlipidemia LDL goal <100 12/16/2009   INCONTINENCE, URGE 11/07/2007   Anxiety and depression 11/06/2006   Essential hypertension 11/06/2006   Past Medical History:  Diagnosis Date   Ankle swelling 07/04/2012   mostly left ankle -wears compression hose occ.   Anxiety    Cancer (HCC) 07/04/2012   dx. Melanoma in situ -back(mid-scapula area)   Cataract    Heart murmur    Hyperlipidemia    Hypertension    Sleep apnea    Past Surgical History:  Procedure Laterality Date   CHOLECYSTECTOMY  07-04-12   '03-Lap. due to gallstones-[place]   COLONOSCOPY N/A 08/18/2013   Procedure: COLONOSCOPY;  Surgeon: Beverley Fiedler, MD;  Location: [facility];  Service: Gastroenterology;  Laterality: N/A;   DENTAL SURGERY  07-04-12   2 wisdom teeth and 2 other teeth   MELANOMA EXCISION  07/10/2012   Procedure: MELANOMA EXCISION;  Surgeon: Almond Lint, MD;  Location: [facility];  Service: General;  Laterality: N/A;  excision of back melanoma insitu   Social History   Tobacco Use   Smoking status: Never   Smokeless tobacco: Never   Tobacco comments:    Never smoke 09/22/22  Substance Use Topics   Alcohol use: Yes    Alcohol/week: 7.0 standard drinks of alcohol    Types: 7 Glasses of wine per week    Comment: 1 glass of wine nightly 09/22/22   Drug use: No   Social History   Socioeconomic History   Marital status: Married    Spouse name: Not on file   Number of children: Not on file   Years of education: Not on file   Highest  education level: Associate degree: academic program  Occupational History   Occupation: Neurosurgeon    Employer: [employer]  Tobacco Use   Smoking status: Never   Smokeless tobacco: Never   Tobacco comments:    Never smoke 09/22/22  Substance and Sexual Activity   Alcohol use: Yes    Alcohol/week: 7.0 standard drinks of alcohol    Types: 7 Glasses of wine per week    Comment: 1 glass of wine nightly 09/22/22   Drug use: No   Sexual activity: Yes    Partners: Male  Other Topics Concern   Not on file  Social History Narrative   Exercise-- not right now   Social Determinants of Health   Financial Resource Strain: Low Risk  (04/10/2023)   Overall Financial Resource Strain (CARDIA)    Difficulty of Paying Living Expenses: Not very hard  Food Insecurity: No Food Insecurity (04/10/2023)   Hunger Vital Sign    Worried About Running Out of Food in the Last Year: Never true    Ran Out of Food in the Last Year: Never true  Transportation Needs: No Transportation Needs (04/10/2023)   PRAPARE - Administrator, Civil Service (Medical): No    Lack of Transportation (Non-Medical): No  Physical Activity: Insufficiently Active (04/10/2023)   Exercise Vital Sign    Days of Exercise per Week: 3 days    Minutes of Exercise per Session: 20 min  Stress: Stress Concern Present (04/10/2023)   [employer] of Occupational Health - Occupational Stress Questionnaire    Feeling of Stress : To some extent  Social Connections: Moderately Integrated (04/10/2023)   Social Connection and Isolation Panel [NHANES]    Frequency of Communication with Friends and Family: Twice a week    Frequency of Social Gatherings with Friends and Family: Once a week    Attends Religious Services: 1 to 4 times per year    Active Member of [employer] or Organizations: No    Attends Engineer, structural: Not on file    Marital Status: Married  Catering manager Violence: Not on file   Family  Status  Relation Name Status   Mother  Alive   Father  Deceased   Brother  Alive   Other  (Not Specified)   Neg Hx  (Not Specified)   Family History  Problem Relation Age of Onset   Colon polyps Mother    Colon cancer Mother    Alzheimer's disease Mother    Hypertension Father    COPD Father    Heart disease Father 78       MI   Cancer Father 74       prostate ca   Coronary artery disease Other 65       1st degree female   Pancreatic  cancer Neg Hx    Stomach cancer Neg Hx    Sleep apnea Neg Hx    Esophageal cancer Neg Hx    Rectal cancer Neg Hx    Allergies  Allergen Reactions   Other Swelling    Peaches(fresh) causes throat itching-swelling   Codeine     Nausea & vomiting   Penicillins     angioedema      ROS    Objective:     BP 138/70 (BP Location: Left Arm, Patient Position: Sitting, Cuff Size: Large)   Pulse (!) 46   Temp 97.8 F (36.6 C) (Oral)   Resp 18   Ht 5' 8.5" (1.74 m)   Wt (!) 336 lb 12.8 oz (152.8 kg)   LMP 06/26/2012   SpO2 96%   BMI 50.47 kg/m  BP Readings from Last 3 Encounters:  04/13/23 138/70  02/28/23 132/68  02/13/23 114/70   Wt Readings from Last 3 Encounters:  04/13/23 (!) 336 lb 12.8 oz (152.8 kg)  02/28/23 (!) 334 lb 12.8 oz (151.9 kg)  02/13/23 (!) 336 lb 6.4 oz (152.6 kg)   SpO2 Readings from Last 3 Encounters:  04/13/23 96%  02/28/23 94%  02/06/23 97%      Physical Exam   No results found for any visits on 04/13/23.  Last CBC Lab Results  Component Value Date   WBC 6.9 11/10/2022   HGB 12.1 11/10/2022   HCT 36.9 11/10/2022   MCV 99.2 11/10/2022   MCH 32.5 11/10/2022   RDW 12.6 11/10/2022   PLT 177 11/10/2022   Last metabolic panel Lab Results  Component Value Date   GLUCOSE 113 (H) 11/10/2022   NA 138 11/10/2022   K 3.8 11/10/2022   CL 102 11/10/2022   CO2 26 11/10/2022   BUN 15 11/10/2022   CREATININE 0.72 11/10/2022   GFRNONAA >60 11/10/2022   CALCIUM 8.8 (L) 11/10/2022   PROT 6.8  09/29/2022   ALBUMIN 4.3 09/29/2022   BILITOT 1.0 09/29/2022   ALKPHOS 53 09/29/2022   AST 15 09/29/2022   ALT 18 09/29/2022   ANIONGAP 10 11/10/2022   Last lipids Lab Results  Component Value Date   CHOL 134 09/29/2022   HDL 46.00 09/29/2022   LDLCALC 63 09/29/2022   TRIG 125.0 09/29/2022   CHOLHDL 3 09/29/2022   Last hemoglobin A1c Lab Results  Component Value Date   HGBA1C 5.6 09/29/2022   Last thyroid functions Lab Results  Component Value Date   TSH 2.072 09/22/2022   Last vitamin D No results found for: "25OHVITD2", "25OHVITD3", "VD25OH" Last vitamin B12 and Folate No results found for: "VITAMINB12", "FOLATE"    The 10-year ASCVD risk score (Arnett DK, et al., 2019) is: 10.1%    Assessment & Plan:   Problem List Items Addressed This Visit       Unprioritized   Preventative health care    Ghm utd Check labs  See AVS Health Maintenance  Topic Date Due   Colonoscopy  08/18/2018   PAP SMEAR-Modifier  10/08/2021   MAMMOGRAM  02/17/2022   COVID-19 Vaccine (4 - 2023-24 season) 06/16/2022   OPHTHALMOLOGY EXAM  10/26/2022   HEMOGLOBIN A1C  03/31/2023   INFLUENZA VACCINE  05/17/2023   Diabetic kidney evaluation - Urine ACR  09/30/2023   FOOT EXAM  09/30/2023   DEXA SCAN  10/09/2023   Diabetic kidney evaluation - eGFR measurement  11/11/2023   DTaP/Tdap/Td (3 - Td or Tdap) 10/04/2030   Hepatitis C Screening  Completed   HIV Screening  Completed   Zoster Vaccines- Shingrix  Completed   HPV VACCINES  Aged Out        MORBID OBESITY    Doing well with weight loss on mounjaro Diet and exercise      Relevant Medications   Semaglutide, 1 MG/DOSE, 4 MG/3ML SOPN   Hypercoagulable state due to paroxysmal atrial fibrillation [facility])    Per cardiology      Essential hypertension    Well controlled, no changes to meds. Encouraged heart healthy diet such as the DASH diet and exercise as tolerated.        Diabetes mellitus type II, non insulin dependent  (HCC)    hgba1c to be checked  minimize simple carbs. Increase exercise as tolerated. Continue current meds       Relevant Medications   Semaglutide, 1 MG/DOSE, 4 MG/3ML SOPN   Anxiety and depression    Doing well on prozac 40mg  daily       Relevant Medications   LORazepam (ATIVAN) 0.5 MG tablet   Other Visit Diagnoses     Uncontrolled type 2 diabetes mellitus with hyperglycemia (HCC)    -  Primary   Relevant Medications   Semaglutide, 1 MG/DOSE, 4 MG/3ML SOPN   Other Relevant Orders   Comprehensive metabolic panel   Hemoglobin A1c   Microalbumin / creatinine urine ratio   Generalized anxiety disorder       Relevant Medications   LORazepam (ATIVAN) 0.5 MG tablet   Hyperlipidemia associated with type 2 diabetes mellitus (HCC)       Relevant Medications   Semaglutide, 1 MG/DOSE, 4 MG/3ML SOPN   Other Relevant Orders   Lipid panel   Comprehensive metabolic panel   Primary hypertension       Relevant Orders   Lipid panel   CBC with Differential/Platelet   Comprehensive metabolic panel   Hemoglobin A1c   Microalbumin / creatinine urine ratio     Assessment and Plan    Depression and Anxiety: Reports feeling better since the increase in Prozac dosage to 40mg . Recent significant life stressors including the death of her mother and her husband's fall and subsequent hospitalization. -Continue Prozac 40mg  daily.  Type 2 Diabetes: Reports occasional blood glucose monitoring with fasting levels around 107-110 and postprandial levels around 130-140. On Ozempic 0.5mg . -Increase Ozempic to 1mg  weekly. -Continue occasional blood glucose monitoring.  Edema: Reports occasional leg swelling. Not currently wearing compression stockings due to heat. -Continue current management and consider resuming compression stockings when weather cools.  General Health Maintenance: -Complete lab work today. -Check blood pressure regularly. -Continue regular sleep habits. -Follow up on refill  for Lorazepam as needed.        No follow-ups on file.    Donato Schultz, DO

## 2023-04-13 NOTE — Assessment & Plan Note (Signed)
Per cardiology 

## 2023-04-15 ENCOUNTER — Other Ambulatory Visit (HOSPITAL_COMMUNITY): Payer: Self-pay | Admitting: Physician Assistant

## 2023-04-19 ENCOUNTER — Other Ambulatory Visit: Payer: Self-pay | Admitting: Family Medicine

## 2023-04-19 DIAGNOSIS — B354 Tinea corporis: Secondary | ICD-10-CM

## 2023-05-26 ENCOUNTER — Other Ambulatory Visit: Payer: Self-pay | Admitting: Family Medicine

## 2023-05-26 DIAGNOSIS — I10 Essential (primary) hypertension: Secondary | ICD-10-CM

## 2023-06-02 ENCOUNTER — Other Ambulatory Visit: Payer: Self-pay | Admitting: Family Medicine

## 2023-06-05 ENCOUNTER — Telehealth: Payer: BC Managed Care – PPO | Admitting: Family Medicine

## 2023-06-05 DIAGNOSIS — L299 Pruritus, unspecified: Secondary | ICD-10-CM | POA: Diagnosis not present

## 2023-06-05 NOTE — Progress Notes (Signed)
E Visit for Rash  We are sorry that you are not feeling well. Here is how we plan to help!  I know you do not have a rash, but the information in this evisit template is the most relevant to your symptoms.   It's possible you are having an allergic reaction given your symptoms. This could be to a medicine or food or lotion - just about anything. Try to think about whether or not there is anything new in your life or environment that could be causing the itchiness.   If you are very itchy, try Benadryl (diphenhydramine) per package instructions. This medicine can make you sleepy so be careful taking it if you need to work or drive. Alternatives are claritin or zyrtec which are less sedating.   HOME CARE:  Take cool showers and avoid direct sunlight. Apply cool compress or wet dressings. Take a bath in an oatmeal bath.  Sprinkle content of one Aveeno packet under running faucet with comfortably warm water.  Bathe for 15-20 minutes, 1-2 times daily.  Pat dry with a towel. Do not rub the rash.   GET HELP RIGHT AWAY IF:  Symptoms don't go away after treatment. Severe itching that persists. If you rash spreads or swells. If you rash begins to smell. If it blisters and opens or develops a yellow-brown crust. You develop a fever. You have a sore throat. You become short of breath.  MAKE SURE YOU:  Understand these instructions. Will watch your condition. Will get help right away if you are not doing well or get worse.  Thank you for choosing an e-visit.  Your e-visit answers were reviewed by a board certified advanced clinical practitioner to complete your personal care plan. Depending upon the condition, your plan could have included both over the counter or prescription medications.  Please review your pharmacy choice. Make sure the pharmacy is open so you can pick up prescription now. If there is a problem, you may contact your provider through [employer] and have the  prescription routed to another pharmacy.  Your safety is important to [place]. If you have drug allergies check your prescription carefully.   For the next 24 hours you can use MyChart to ask questions about today's visit, request a non-urgent call back, or ask for a work or school excuse. You will get an email in the next two days asking about your experience. I hope that your e-visit has been valuable and will speed your recovery.  I have spent 5 minutes in review of e-visit questionnaire, review and updating patient chart, medical decision making and response to patient.   Rica Mast, PhD, FNP-BC

## 2023-06-16 ENCOUNTER — Other Ambulatory Visit: Payer: Self-pay | Admitting: Family Medicine

## 2023-06-16 DIAGNOSIS — E785 Hyperlipidemia, unspecified: Secondary | ICD-10-CM

## 2023-06-20 ENCOUNTER — Encounter: Payer: Self-pay | Admitting: Family Medicine

## 2023-06-20 DIAGNOSIS — Z1231 Encounter for screening mammogram for malignant neoplasm of breast: Secondary | ICD-10-CM

## 2023-06-23 ENCOUNTER — Other Ambulatory Visit: Payer: Self-pay | Admitting: Family Medicine

## 2023-06-23 DIAGNOSIS — R609 Edema, unspecified: Secondary | ICD-10-CM

## 2023-06-23 DIAGNOSIS — R32 Unspecified urinary incontinence: Secondary | ICD-10-CM

## 2023-07-05 ENCOUNTER — Encounter (INDEPENDENT_AMBULATORY_CARE_PROVIDER_SITE_OTHER): Payer: BC Managed Care – PPO | Admitting: Ophthalmology

## 2023-07-09 ENCOUNTER — Encounter: Payer: Self-pay | Admitting: Family Medicine

## 2023-07-10 ENCOUNTER — Ambulatory Visit (HOSPITAL_BASED_OUTPATIENT_CLINIC_OR_DEPARTMENT_OTHER)
Admission: RE | Admit: 2023-07-10 | Discharge: 2023-07-10 | Disposition: A | Discharge status: Home / Self Care | Payer: BC Managed Care – PPO | Source: Ambulatory Visit | Attending: Family Medicine | Admitting: Family Medicine

## 2023-07-10 ENCOUNTER — Encounter (HOSPITAL_BASED_OUTPATIENT_CLINIC_OR_DEPARTMENT_OTHER): Payer: Self-pay

## 2023-07-10 DIAGNOSIS — Z1231 Encounter for screening mammogram for malignant neoplasm of breast: Secondary | ICD-10-CM | POA: Insufficient documentation

## 2023-07-17 NOTE — Progress Notes (Unsigned)
Patient: Pam Phillips Date of Birth: Dec 12, 1960  Reason for Visit: Follow up History from: Patient Primary Neurologist: Frances Furbish    ASSESSMENT AND PLAN 62 y.o. year old female   1.  OSA on CPAP  Continue nightly CPAP usage.  Recommend minimum of 4 hours nightly utilization.  Continue current settings.  Will send an order to DME to send supplies as needed. Will order ONO as CPAP titration study indicated possible need for oxygen due to desaturation.  Follow-up with me in 1 year virtually or sooner if needed.  Orders Placed This Encounter  Procedures   For home use only DME continuous positive airway pressure (CPAP)   Pulse oximetry, overnight   HISTORY OF PRESENT ILLNESS: Today 07/18/23 Had ONO in December, results were felt to be inaccurate, will reorder.  I do not see this was completed. She is newly diagnosed with AFIB, on Eliquis now, is back to regular rhythm now, on metoprolol. ESS 11. Without CPAP, she feels more tired, dragging throughout the day.  CPAP compliance 06/18/2023-07/17/23 shows usage 29/30 days at 97%, greater than 4 hours 73%.  Average usage 5 hours.  Set pressure 11 cm.  Leak 8.9.  AHI 0.7.  Uses fullface mask.  Often times to falls asleep reading or watching TV, then put CPAP on when she wakes up.  She tolerates well.  No issues with the equipment.  Her husband's health seems to have stabilized.  Her mother passed away last year.  She is now retired.  HISTORY  Pam Phillips is a 62 year old right-handed woman with an underlying medical history of melanoma, anxiety, hypertension, hyperlipidemia, and morbid obesity with a BMI of over 50, who presents for follow-up consultation of her obstructive sleep apnea after interim testing and starting CPAP therapy at home.  The patient is unaccompanied today.  I first met her at the request of her primary care physician on 04/03/2022, at which time the patient reported snoring and excessive daytime somnolence as well as  witnessed apneas.  She was encouraged to proceed with sleep testing.  She had a baseline sleep study, followed by a CPAP titration study.   She had a baseline sleep study on 04/20/22 which showed moderate to severe obstructive sleep apnea, with a total AHI of 16.3/hour, REM AHI of 43.4/hour, supine AHI of 21.7/hour and O2 nadir of 71% (during supine REM sleep).    She had a subsequent CPAP titration study on 05/05/2022, at which time she was to titrated from 7 cm to 11 cm.  She was tried on supplemental oxygen with CPAP briefly but at the end of the study it was discontinued, her baseline oxygen saturations drifted closer to 89% with a CPAP of 11 cm without supplemental oxygen, a fullface mask size small from [employer] was used for the study.  Based on her test results I prescribed home CPAP therapy at a pressure of 11 cm without supplemental oxygen.  Her set up date was 05/22/2022.  She has a ResMed AirSense 10 AutoSet machine.   Today, 07/17/2022: I reviewed her CPAP compliance data from 06/17/2022 through 07/12/2022, which is a total of 26 days, during which time she used her machine every night with percent use days greater than 4 hours at 81%, indicating very good compliance with an average usage of 4 hours and 49 minutes, residual AHI at goal at 0.4/h, leak on the low side with the 95th percentile at 4.3 L/min on a pressure of 11 cm with EPR of  3.  She reports doing fairly well with CPAP, she feels that she has adjusted well to treatment, she is using the ResMed F30 under the nose fullface mask.  Sometimes will sleep without the mask on as she likes to read before falling asleep and she cannot put her reading glasses on with the mask on and she will fall asleep without it, then wake up in the middle of the night and put the mask on.  She does note some improvement, she feels that her daytime energy and stamina are a little bit better.  She still has tiredness, she attributes some to stress in general.  She is  worried about her husband's health.  Her mother-in-law also lives with them.  Her own mom is in a nursing home.  She is working on weight loss, since starting the Ozempic she has lost about 15 pounds.  She is working with her primary care on this.  She has just recently increased to the 0.5 mg dose.  She is very to continue with her CPAP and agreeable to pursuing a pulse oximetry test while on CPAP therapy as we discussed her oxygen desaturations during both sleep studies. Her Epworth sleepiness score is 7 out of 24, previously was 14 out of 24, significantly improved.  REVIEW OF SYSTEMS: Out of a complete 14 system review of symptoms, the patient complains only of the following symptoms, and all other reviewed systems are negative.  See HPI  ALLERGIES: Allergies  Allergen Reactions   Other Swelling    Peaches(fresh) causes throat itching-swelling   Codeine     Nausea & vomiting   Penicillins     angioedema    HOME MEDICATIONS: Outpatient Medications Prior to Visit  Medication Sig Dispense Refill   ACCU-CHEK GUIDE test strip USE FOUR TIMES DAILY AS DIRECTED 100 strip 1   amLODipine (NORVASC) 2.5 MG tablet TAKE 1 TABLET(2.5 MG) BY MOUTH DAILY 90 tablet 1   apixaban (ELIQUIS) 5 MG TABS tablet TAKE 1 TABLET(5 MG) BY MOUTH TWICE DAILY 60 tablet 10   blood glucose meter kit and supplies KIT Dispense based on patient and insurance preference. Use up to four times daily as directed. (FOR ICD-9 250.00, 250.01). 1 each 0   fish oil-omega-3 fatty acids 1000 MG capsule Take 1 g by mouth 2 (two) times daily.      Flaxseed, Linseed, (FLAX SEED OIL) 1000 MG CAPS Take 1,000 mg by mouth in the morning and at bedtime.     FLUoxetine (PROZAC) 40 MG capsule Take 1 capsule (40 mg total) by mouth daily. 90 capsule 3   furosemide (LASIX) 20 MG tablet TAKE 1 TABLET(20 MG) BY MOUTH DAILY 90 tablet 1   GLUCOSAMINE-CHONDROITIN PO Take 1 capsule by mouth 2 (two) times daily.     KLAYESTA powder APPLY TO THE  AFFECTED AREA THREE TIMES DAILY AS NEEDED 60 g 5   LORazepam (ATIVAN) 0.5 MG tablet TAKE 1 TABLET(0.5 MG) BY MOUTH EVERY 8 HOURS AS NEEDED FOR ANXIETY 90 tablet 1   lovastatin (MEVACOR) 40 MG tablet TAKE 1 TABLET(40 MG) BY MOUTH AT BEDTIME 90 tablet 1   metFORMIN (GLUCOPHAGE) 500 MG tablet TAKE 1 TABLET(500 MG) BY MOUTH TWICE DAILY WITH A MEAL 180 tablet 1   metoprolol tartrate (LOPRESSOR) 25 MG tablet Take 0.5 tablets (12.5 mg total) by mouth 2 (two) times daily. 60 tablet 11   Multiple Vitamin (MULTIVITAMIN) capsule Take 1 capsule by mouth daily.     olmesartan-hydrochlorothiazide (BENICAR HCT)  40-25 MG tablet TAKE 1 TABLET BY MOUTH DAILY 90 tablet 1   polyvinyl alcohol (LIQUIFILM TEARS) 1.4 % ophthalmic solution Place 1 drop into both eyes daily as needed (For dry eyes.).     Semaglutide, 1 MG/DOSE, 4 MG/3ML SOPN Inject 1 mg as directed once a week. 9 mL 1   tolterodine (DETROL) 2 MG tablet TAKE 1 TABLET(2 MG) BY MOUTH TWICE DAILY 180 tablet 1   No facility-administered medications prior to visit.    PAST MEDICAL HISTORY: Past Medical History:  Diagnosis Date   Ankle swelling 07/04/2012   mostly left ankle -wears compression hose occ.   Anxiety    Cancer (HCC) 07/04/2012   dx. Melanoma in situ -back(mid-scapula area)   Cataract    Heart murmur    Hyperlipidemia    Hypertension    Sleep apnea     PAST SURGICAL HISTORY: Past Surgical History:  Procedure Laterality Date   CHOLECYSTECTOMY  07-04-12   '03-Lap. due to gallstones-[place]   COLONOSCOPY N/A 08/18/2013   Procedure: COLONOSCOPY;  Surgeon: Beverley Fiedler, MD;  Location: [facility];  Service: Gastroenterology;  Laterality: N/A;   DENTAL SURGERY  07-04-12   2 wisdom teeth and 2 other teeth   MELANOMA EXCISION  07/10/2012   Procedure: MELANOMA EXCISION;  Surgeon: Almond Lint, MD;  Location: [facility];  Service: General;  Laterality: N/A;  excision of back melanoma insitu    FAMILY HISTORY: Family History  Problem  Relation Age of Onset   Colon polyps Mother    Colon cancer Mother    Alzheimer's disease Mother    Hypertension Father    COPD Father    Heart disease Father 76       MI   Cancer Father 6       prostate ca   Coronary artery disease Other 96       1st degree female   Pancreatic cancer Neg Hx    Stomach cancer Neg Hx    Sleep apnea Neg Hx    Esophageal cancer Neg Hx    Rectal cancer Neg Hx     SOCIAL HISTORY: Social History   Socioeconomic History   Marital status: Married    Spouse name: Not on file   Number of children: Not on file   Years of education: Not on file   Highest education level: Associate degree: academic program  Occupational History   Occupation: Neurosurgeon    Employer: [employer]  Tobacco Use   Smoking status: Never   Smokeless tobacco: Never   Tobacco comments:    Never smoke 09/22/22  Substance and Sexual Activity   Alcohol use: Yes    Alcohol/week: 7.0 standard drinks of alcohol    Types: 7 Glasses of wine per week    Comment: 1 glass of wine nightly 09/22/22   Drug use: No   Sexual activity: Yes    Partners: Male  Other Topics Concern   Not on file  Social History Narrative   Exercise-- not right now   Social Determinants of Health   Financial Resource Strain: Low Risk  (04/10/2023)   Overall Financial Resource Strain (CARDIA)    Difficulty of Paying Living Expenses: Not very hard  Food Insecurity: No Food Insecurity (04/10/2023)   Hunger Vital Sign    Worried About Running Out of Food in the Last Year: Never true    Ran Out of Food in the Last Year: Never true  Transportation Needs: No Transportation Needs (  04/10/2023)   PRAPARE - Administrator, Civil Service (Medical): No    Lack of Transportation (Non-Medical): No  Physical Activity: Insufficiently Active (04/10/2023)   Exercise Vital Sign    Days of Exercise per Week: 3 days    Minutes of Exercise per Session: 20 min  Stress: Stress Concern Present  (04/10/2023)   [employer] of Occupational Health - Occupational Stress Questionnaire    Feeling of Stress : To some extent  Social Connections: Moderately Integrated (04/10/2023)   Social Connection and Isolation Panel [NHANES]    Frequency of Communication with Friends and Family: Twice a week    Frequency of Social Gatherings with Friends and Family: Once a week    Attends Religious Services: 1 to 4 times per year    Active Member of [employer] or Organizations: No    Attends Engineer, structural: Not on file    Marital Status: Married  Catering manager Violence: Not on file    PHYSICAL EXAM  Vitals:   07/18/23 0750  BP: (!) 155/89  Pulse: 67  Weight: (!) 332 lb 6.4 oz (150.8 kg)  Height: 5\' 7"  (1.702 m)   Body mass index is 52.06 kg/m.  Generalized: Well developed, in no acute distress  Neurological examination  Mentation: Alert oriented to time, place, history taking. Follows all commands speech and language fluent Cranial nerve II-XII: Pupils were equal round Motor: Moves all extremities independently Gait and station: Gait is normal.    DIAGNOSTIC DATA (LABS, IMAGING, TESTING) - I reviewed patient records, labs, notes, testing and imaging myself where available.  Lab Results  Component Value Date   WBC 7.4 04/13/2023   HGB 11.9 (L) 04/13/2023   HCT 36.2 04/13/2023   MCV 95.4 04/13/2023   PLT 179.0 04/13/2023      Component Value Date/Time   NA 140 04/13/2023 1142   K 4.0 04/13/2023 1142   CL 103 04/13/2023 1142   CO2 30 04/13/2023 1142   GLUCOSE 100 (H) 04/13/2023 1142   GLUCOSE 128 (H) 08/03/2006 0932   BUN 15 04/13/2023 1142   CREATININE 0.62 04/13/2023 1142   CREATININE 0.63 06/29/2020 1046   CALCIUM 9.4 04/13/2023 1142   PROT 6.6 04/13/2023 1142   ALBUMIN 4.1 04/13/2023 1142   AST 14 04/13/2023 1142   ALT 17 04/13/2023 1142   ALKPHOS 54 04/13/2023 1142   BILITOT 1.0 04/13/2023 1142   GFRNONAA >60 11/10/2022 0912   GFRAA >90 07/04/2012  1015   Lab Results  Component Value Date   CHOL 134 04/13/2023   HDL 45.20 04/13/2023   LDLCALC 68 04/13/2023   TRIG 106.0 04/13/2023   CHOLHDL 3 04/13/2023   Lab Results  Component Value Date   HGBA1C 5.4 04/13/2023   No results found for: "[id number]" Lab Results  Component Value Date   TSH 2.072 09/22/2022    Margie Ege, AGNP-C, DNP 07/18/2023, 7:53 AM [facility] [address] [place], [place] [address] [phone]

## 2023-07-18 ENCOUNTER — Encounter: Payer: Self-pay | Admitting: Neurology

## 2023-07-18 ENCOUNTER — Ambulatory Visit: Payer: BC Managed Care – PPO | Admitting: Neurology

## 2023-07-18 VITALS — BP 155/89 | HR 67 | Ht 67.0 in | Wt 332.4 lb

## 2023-07-18 DIAGNOSIS — G4733 Obstructive sleep apnea (adult) (pediatric): Secondary | ICD-10-CM

## 2023-07-18 NOTE — Patient Instructions (Signed)
Continue nightly CPAP usage.  Recommend minimum of 4 hours nightly utilization.  Continue current settings.  Will send an order to DME to send supplies as needed. I will order overnight pulse oximetry test at home.

## 2023-07-18 NOTE — Progress Notes (Signed)
Community message sent

## 2023-07-19 ENCOUNTER — Telehealth: Payer: Self-pay

## 2023-07-19 NOTE — Telephone Encounter (Signed)
-----   Message from Glean Salvo sent at 07/19/2023  4:32 PM EDT ----- See if this order is suitable for DME

## 2023-07-19 NOTE — Addendum Note (Signed)
Addended by: Glean Salvo on: 07/19/2023 04:32 PM   Modules accepted: Orders

## 2023-07-19 NOTE — Telephone Encounter (Signed)
Order placed for pulse oximetry thru [facility] msg

## 2023-07-26 ENCOUNTER — Encounter (INDEPENDENT_AMBULATORY_CARE_PROVIDER_SITE_OTHER): Payer: BC Managed Care – PPO | Admitting: Ophthalmology

## 2023-08-13 NOTE — Telephone Encounter (Signed)
ONO order received from [facility]. Test date: 08/08/2023. Report placed in Dr Teofilo Pod office for review.

## 2023-08-13 NOTE — Telephone Encounter (Signed)
I received patient's overnight pulse oximetry test results.  She had an ONO while on CPAP on 08/08/2023, study started at 12:47 AM, study ended at 8:43 AM for a total duration of 7 hours and 56 minutes.  Average oxygen saturation was 92.5%, nadir was 87%, time below or at 88% saturation was 1 minute and 27 seconds.  Please advise patient that her current overnight oxygen test results indicate no significant or recurrent desaturations while she is on CPAP therapy.  Please advise her to be consistent with her CPAP, use it all night, every night and follow-up as scheduled with Maralyn Sago next year.

## 2023-08-14 ENCOUNTER — Encounter: Payer: Self-pay | Admitting: Family Medicine

## 2023-08-14 NOTE — Telephone Encounter (Signed)
I called pt and let her know the results of the ONO.  She verbalized understanding.  I relayed to use cpap nightly.  F/u with SS/NP as scheduled.  Pt was thankful for the information.

## 2023-08-15 ENCOUNTER — Encounter (INDEPENDENT_AMBULATORY_CARE_PROVIDER_SITE_OTHER): Payer: BC Managed Care – PPO | Admitting: Ophthalmology

## 2023-08-30 ENCOUNTER — Other Ambulatory Visit: Payer: Self-pay | Admitting: Family Medicine

## 2023-09-11 ENCOUNTER — Encounter (INDEPENDENT_AMBULATORY_CARE_PROVIDER_SITE_OTHER): Payer: BC Managed Care – PPO | Admitting: Ophthalmology

## 2023-09-11 DIAGNOSIS — H35033 Hypertensive retinopathy, bilateral: Secondary | ICD-10-CM

## 2023-09-11 DIAGNOSIS — D3132 Benign neoplasm of left choroid: Secondary | ICD-10-CM

## 2023-09-11 DIAGNOSIS — H43813 Vitreous degeneration, bilateral: Secondary | ICD-10-CM | POA: Diagnosis not present

## 2023-09-11 DIAGNOSIS — I10 Essential (primary) hypertension: Secondary | ICD-10-CM

## 2023-09-11 DIAGNOSIS — H2513 Age-related nuclear cataract, bilateral: Secondary | ICD-10-CM

## 2023-09-27 ENCOUNTER — Other Ambulatory Visit (HOSPITAL_COMMUNITY): Payer: Self-pay | Admitting: *Deleted

## 2023-09-27 MED ORDER — METOPROLOL TARTRATE 25 MG PO TABS: 12.5000 mg | ORAL_TABLET | Freq: Two times a day (BID) | ORAL | 2 refills | Status: DC

## 2023-09-29 ENCOUNTER — Other Ambulatory Visit: Payer: Self-pay | Admitting: Family Medicine

## 2023-09-29 DIAGNOSIS — E1165 Type 2 diabetes mellitus with hyperglycemia: Secondary | ICD-10-CM

## 2023-10-02 ENCOUNTER — Encounter: Payer: Self-pay | Admitting: Neurology

## 2023-10-04 ENCOUNTER — Telehealth: Payer: Self-pay | Admitting: *Deleted

## 2023-10-04 NOTE — Telephone Encounter (Signed)
PA approved 10/04/23-10/03/26

## 2023-10-04 NOTE — Telephone Encounter (Signed)
Prior auth started via cover my meds.  Awaiting determination.  Key: [id number]

## 2023-10-15 ENCOUNTER — Ambulatory Visit: Payer: BC Managed Care – PPO | Admitting: Family Medicine

## 2023-10-15 ENCOUNTER — Encounter: Payer: Self-pay | Admitting: Family Medicine

## 2023-10-15 VITALS — BP 132/80 | HR 53 | Temp 98.4°F | Resp 18 | Ht 67.0 in | Wt 333.6 lb

## 2023-10-15 DIAGNOSIS — Z1211 Encounter for screening for malignant neoplasm of colon: Secondary | ICD-10-CM

## 2023-10-15 DIAGNOSIS — E1169 Type 2 diabetes mellitus with other specified complication: Secondary | ICD-10-CM | POA: Diagnosis not present

## 2023-10-15 DIAGNOSIS — I480 Paroxysmal atrial fibrillation: Secondary | ICD-10-CM

## 2023-10-15 DIAGNOSIS — I10 Essential (primary) hypertension: Secondary | ICD-10-CM | POA: Diagnosis not present

## 2023-10-15 DIAGNOSIS — E1165 Type 2 diabetes mellitus with hyperglycemia: Secondary | ICD-10-CM

## 2023-10-15 DIAGNOSIS — F411 Generalized anxiety disorder: Secondary | ICD-10-CM

## 2023-10-15 DIAGNOSIS — E785 Hyperlipidemia, unspecified: Secondary | ICD-10-CM

## 2023-10-15 LAB — COMPREHENSIVE METABOLIC PANEL
ALT: 12 U/L (ref 0–35)
AST: 12 U/L (ref 0–37)
Albumin: 4 g/dL (ref 3.5–5.2)
Alkaline Phosphatase: 54 U/L (ref 39–117)
BUN: 13 mg/dL (ref 6–23)
CO2: 28 meq/L (ref 19–32)
Calcium: 9.1 mg/dL (ref 8.4–10.5)
Chloride: 103 meq/L (ref 96–112)
Creatinine, Ser: 0.62 mg/dL (ref 0.40–1.20)
GFR: 94.82 mL/min (ref >60.00)
Glucose, Bld: 95 mg/dL (ref 70–99)
Potassium: 4.1 meq/L (ref 3.5–5.1)
Sodium: 140 meq/L (ref 135–145)
Total Bilirubin: 0.8 mg/dL (ref 0.2–1.2)
Total Protein: 6.5 g/dL (ref 6.0–8.3)

## 2023-10-15 LAB — LIPID PANEL
Cholesterol: 130 mg/dL (ref 0–200)
HDL: 47.3 mg/dL (ref >39.00)
LDL Cholesterol: 64 mg/dL (ref 0–99)
NonHDL: 82.94
Total CHOL/HDL Ratio: 3
Triglycerides: 97 mg/dL (ref 0.0–149.0)
VLDL: 19.4 mg/dL (ref 0.0–40.0)

## 2023-10-15 LAB — CBC WITH DIFFERENTIAL/PLATELET
Basophils Absolute: 0 10*3/uL (ref 0.0–0.1)
Basophils Relative: 0.7 % (ref 0.0–3.0)
Eosinophils Absolute: 0.1 10*3/uL (ref 0.0–0.7)
Eosinophils Relative: 1.4 % (ref 0.0–5.0)
HCT: 34.1 % — ABNORMAL LOW (ref 36.0–46.0)
Hemoglobin: 11.2 g/dL — ABNORMAL LOW (ref 12.0–15.0)
Lymphocytes Relative: 21 % (ref 12.0–46.0)
Lymphs Abs: 1.5 10*3/uL (ref 0.7–4.0)
MCHC: 33 g/dL (ref 30.0–36.0)
MCV: 97 fL (ref 78.0–100.0)
Monocytes Absolute: 0.5 10*3/uL (ref 0.1–1.0)
Monocytes Relative: 6.6 % (ref 3.0–12.0)
Neutro Abs: 4.9 10*3/uL (ref 1.4–7.7)
Neutrophils Relative %: 70.3 % (ref 43.0–77.0)
Platelets: 161 10*3/uL (ref 150.0–400.0)
RBC: 3.52 Mil/uL — ABNORMAL LOW (ref 3.87–5.11)
RDW: 13.1 % (ref 11.5–15.5)
WBC: 6.9 10*3/uL (ref 4.0–10.5)

## 2023-10-15 LAB — HEMOGLOBIN A1C: Hgb A1c MFr Bld: 5.4 % (ref 4.6–6.5)
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
## 2023-10-15 NOTE — Assessment & Plan Note (Signed)
Stable.  On eliquis.   

## 2023-10-15 NOTE — Assessment & Plan Note (Signed)
Well controlled, no changes to meds. Encouraged heart healthy diet such as the DASH diet and exercise as tolerated.  °

## 2023-10-15 NOTE — Patient Instructions (Signed)

## 2023-10-15 NOTE — Assessment & Plan Note (Signed)
Encourage heart healthy diet such as MIND or DASH diet, increase exercise, avoid trans fats, simple carbohydrates and processed foods, consider a krill or fish or flaxseed oil cap daily.  °

## 2023-10-18 ENCOUNTER — Encounter: Payer: Self-pay | Admitting: Family Medicine

## 2023-10-26 ENCOUNTER — Encounter: Payer: Self-pay | Admitting: Family Medicine

## 2023-11-24 ENCOUNTER — Other Ambulatory Visit: Payer: Self-pay | Admitting: Family Medicine

## 2023-11-24 DIAGNOSIS — I10 Essential (primary) hypertension: Secondary | ICD-10-CM

## 2023-12-15 ENCOUNTER — Other Ambulatory Visit: Payer: Self-pay | Admitting: Family Medicine

## 2023-12-15 DIAGNOSIS — E785 Hyperlipidemia, unspecified: Secondary | ICD-10-CM

## 2023-12-27 ENCOUNTER — Ambulatory Visit: Payer: Self-pay | Admitting: Physician Assistant

## 2023-12-27 ENCOUNTER — Telehealth: Admitting: Family Medicine

## 2023-12-27 DIAGNOSIS — J069 Acute upper respiratory infection, unspecified: Secondary | ICD-10-CM | POA: Diagnosis not present

## 2023-12-28 MED ORDER — BENZONATATE 200 MG PO CAPS: 200.0000 mg | ORAL_CAPSULE | Freq: Two times a day (BID) | ORAL | 0 refills | Status: DC | PRN

## 2023-12-28 MED ORDER — FLUTICASONE PROPIONATE 50 MCG/ACT NA SUSP: 2.0000 | Freq: Every day | NASAL | 6 refills | Status: DC

## 2023-12-28 NOTE — Progress Notes (Signed)

## 2023-12-29 ENCOUNTER — Other Ambulatory Visit: Payer: Self-pay | Admitting: Family Medicine

## 2023-12-29 DIAGNOSIS — R32 Unspecified urinary incontinence: Secondary | ICD-10-CM

## 2024-01-10 NOTE — Progress Notes (Addendum)
 01/11/2024 Pam Phillips [id number] Dec 28, 1960  Referring provider: Crecencio Dodge, Candida Chalk, * Primary GI doctor: Dr. Bridgett Camps  ASSESSMENT AND PLAN:   Personal history of TA polyps Colonoscopy 2 TA polytps 3 mm and 6-7 mm 2014, no symptoms, no anemia Will schedule colon at  due to BMI of 52 We have discussed the risks of bleeding, infection, perforation, medication reactions, and remote risk of death associated with colonoscopy. All questions were answered and the patient acknowledges these risk and wishes to proceed.  Atrial fibrillation  on Eliquis  Unremarkable echocardiogram 2024 Hold Eliquis  for 2 days before procedure will instruct when and how to resume after procedure.  Patient understands that there is a low but real risk of cardiovascular event such as heart attack, stroke, or embolism /  thrombosis, or ischemia while off Eliquis   The patient consents to proceed.  Will communicate by phone or EMR with patient's prescribing provider to confirm that holding Eliquis  is reasonable in this case.   Morbid obesity  Body mass index is 52.41 kg/m.  -Patient has been advised to make an attempt to improve diet and exercise patterns to aid in weight loss. -Recommended diet heavy in fruits and veggies and low in animal meats, cheeses, and dairy products, appropriate calorie intake  Anemia, normocytic, no overt GI bleeding 10/15/2023  HGB 11.2 MCV 97.0 Platelets 161.0 Check iron, ferritin, B12, consider adding on EGD if iron def.  Recent Labs    04/13/23 1142 10/15/23 1135  HGB 11.9* 11.2*     Patient Care Team: Crecencio Dodge, Candida Chalk, DO as PCP - General Ary Laurel, PA-C as Physician Assistant (Physician Assistant) Pyrtle, Amber Bail, MD as Consulting Physician (Gastroenterology) Rexene Catching, MD as Consulting Physician (Ophthalmology) Phineas Breech, OD as Consulting Physician (Optometry) Denman Fischer, MD (Dermatology) [facility]  (Ophthalmology)  HISTORY OF PRESENT ILLNESS: 63 y.o. female with a past medical history listed below presents for colonoscopy on coagulation.  Patient was set up for colonoscopy 09/11/2022 but found to have new onset atrial fibrillation.   Previous colonoscopy unremarkable 2014. 11/10/2022 echocardiogram normal ejection fraction no valvular issues.  Discussed the use of AI scribe software for clinical note transcription with the patient, who gave verbal consent to proceed.  History of Present Illness   Pam Phillips is a 63 year old female with atrial fibrillation who presents for a colonoscopy.  She has a history of atrial fibrillation, currently managed with Eliquis . A colonoscopy was previously scheduled but canceled due to the discovery of atrial fibrillation during the pre-procedure setup over a year ago. Rescheduling was delayed due to personal circumstances, including her husband's health issues and the passing of her mother.  No changes in bowel habits, dark or black stools, blood in the stool, heartburn, reflux, or trouble swallowing. No shortness of breath, chest discomfort, or abdominal discomfort. Occasional swelling in her legs has been present for a long time.  Her husband has experienced significant health challenges, including a broken neck, and she has recently arranged for home help through the [place].      She  reports that she has never smoked. She has never used smokeless tobacco. She reports current alcohol use of about 7.0 standard drinks of alcohol per week. She reports that she does not use drugs.  RELEVANT GI HISTORY, IMAGING AND LABS  CBC    Component Value Date/Time   WBC 6.9 10/15/2023 1135   RBC 3.52 (L) 10/15/2023 1135   HGB  11.2 (L) 10/15/2023 1135   HCT 34.1 (L) 10/15/2023 1135   PLT 161.0 10/15/2023 1135   MCV 97.0 10/15/2023 1135   MCH 32.5 11/10/2022 0912   MCHC 33.0 10/15/2023 1135   RDW 13.1 10/15/2023 1135   LYMPHSABS 1.5 10/15/2023 1135    MONOABS 0.5 10/15/2023 1135   EOSABS 0.1 10/15/2023 1135   BASOSABS 0.0 10/15/2023 1135   Recent Labs    04/13/23 1142 10/15/23 1135  HGB 11.9* 11.2*    CMP     Component Value Date/Time   NA 140 10/15/2023 1135   K 4.1 10/15/2023 1135   CL 103 10/15/2023 1135   CO2 28 10/15/2023 1135   GLUCOSE 95 10/15/2023 1135   GLUCOSE 128 (H) 08/03/2006 0932   BUN 13 10/15/2023 1135   CREATININE 0.62 10/15/2023 1135   CREATININE 0.63 06/29/2020 1046   CALCIUM 9.1 10/15/2023 1135   PROT 6.5 10/15/2023 1135   ALBUMIN 4.0 10/15/2023 1135   AST 12 10/15/2023 1135   ALT 12 10/15/2023 1135   ALKPHOS 54 10/15/2023 1135   BILITOT 0.8 10/15/2023 1135   GFRNONAA >60 11/10/2022 0912   GFRAA >90 07/04/2012 1015      Latest Ref Rng & Units 10/15/2023   11:35 AM 04/13/2023   11:42 AM 09/29/2022   10:41 AM  Hepatic Function  Total Protein 6.0 - 8.3 g/dL 6.5  6.6  6.8   Albumin 3.5 - 5.2 g/dL 4.0  4.1  4.3   AST 0 - 37 U/L 12  14  15    ALT 0 - 35 U/L 12  17  18    Alk Phosphatase 39 - 117 U/L 54  54  53   Total Bilirubin 0.2 - 1.2 mg/dL 0.8  1.0  1.0       Current Medications:   Current Outpatient Medications (Endocrine & Metabolic):    metFORMIN  (GLUCOPHAGE ) 500 MG tablet, TAKE 1 TABLET(500 MG) BY MOUTH TWICE DAILY WITH A MEAL   OZEMPIC , 1 MG/DOSE, 4 MG/3ML SOPN, INJECT 1MG  AS DIRECTED ONCE A WEEK  Current Outpatient Medications (Cardiovascular):    amLODipine  (NORVASC ) 2.5 MG tablet, TAKE 1 TABLET(2.5 MG) BY MOUTH DAILY   furosemide  (LASIX ) 20 MG tablet, TAKE 1 TABLET(20 MG) BY MOUTH DAILY   lovastatin  (MEVACOR ) 40 MG tablet, Take 1 tablet (40 mg total) by mouth at bedtime.   metoprolol  tartrate (LOPRESSOR ) 25 MG tablet, Take 0.5 tablets (12.5 mg total) by mouth 2 (two) times daily.   olmesartan -hydrochlorothiazide  (BENICAR  HCT) 40-25 MG tablet, Take 1 tablet by mouth daily.  Current Outpatient Medications (Respiratory):    fluticasone  (FLONASE ) 50 MCG/ACT nasal spray, Place 2  sprays into both nostrils daily.   Current Outpatient Medications (Hematological):    apixaban  (ELIQUIS ) 5 MG TABS tablet, TAKE 1 TABLET(5 MG) BY MOUTH TWICE DAILY  Current Outpatient Medications (Other):    ACCU-CHEK GUIDE test strip, USE FOUR TIMES DAILY AS DIRECTED   blood glucose meter kit and supplies KIT, Dispense based on patient and insurance preference. Use up to four times daily as directed. (FOR ICD-9 250.00, 250.01).   fish oil-omega-3 fatty acids  1000 MG capsule, Take 1 g by mouth 2 (two) times daily.    Flaxseed, Linseed, (FLAX SEED OIL) 1000 MG CAPS, Take 1,000 mg by mouth in the morning and at bedtime.   FLUoxetine  (PROZAC ) 40 MG capsule, Take 1 capsule (40 mg total) by mouth daily.   GLUCOSAMINE-CHONDROITIN PO, Take 1 capsule by mouth 2 (two) times daily.  KLAYESTA  powder, APPLY TO THE AFFECTED AREA THREE TIMES DAILY AS NEEDED   LORazepam  (ATIVAN ) 0.5 MG tablet, TAKE 1 TABLET(0.5 MG) BY MOUTH EVERY 8 HOURS AS NEEDED FOR ANXIETY   Multiple Vitamin (MULTIVITAMIN) capsule, Take 1 capsule by mouth daily.   polyvinyl alcohol (LIQUIFILM TEARS) 1.4 % ophthalmic solution, Place 1 drop into both eyes daily as needed (For dry eyes.).   tolterodine  (DETROL ) 2 MG tablet, TAKE 1 TABLET(2 MG) BY MOUTH TWICE DAILY  Medical History:  Past Medical History:  Diagnosis Date   Ankle swelling 07/04/2012   mostly left ankle -wears compression hose occ.   Anxiety    Atrial fibrillation (HCC)    Cancer (HCC) 07/04/2012   dx. Melanoma in situ -back(mid-scapula area)   Cataract    Heart murmur    Hyperlipidemia    Hypertension    Sleep apnea    Allergies:  Allergies  Allergen Reactions   Other Swelling    Peaches(fresh) causes throat itching-swelling   Codeine     Nausea & vomiting   Penicillins     angioedema     Surgical History:  She  has a past surgical history that includes Dental surgery (07-04-12); Melanoma excision (07/10/2012); Cholecystectomy (07-04-12); and Colonoscopy  (N/A, 08/18/2013). Family History:  Her family history includes Alzheimer's disease in her mother; COPD in her father; Cancer (age of onset: 92) in her father; Colon cancer in her mother; Colon polyps in her mother; Coronary artery disease (age of onset: 16) in an other family member; Heart disease (age of onset: 35) in her father; Hypertension in her father.  REVIEW OF SYSTEMS  : All other systems reviewed and negative except where noted in the History of Present Illness.  PHYSICAL EXAM: BP 122/70 (BP Location: Left Wrist, Patient Position: Sitting, Cuff Size: Normal)   Pulse (!) 52   Ht 5' 5.75" (1.67 m) Comment: height measured without shoes  Wt (!) 322 lb 4 oz (146.2 kg)   LMP 06/26/2012   BMI 52.41 kg/m  Physical Exam   GENERAL APPEARANCE: morbidly obese, in no apparent distress. HEENT: No cervical lymphadenopathy, unremarkable thyroid , sclerae anicteric, conjunctiva pink. RESPIRATORY: Respiratory effort normal, BS equal bilateral without rales, rhonchi, wheezing. CARDIO: RRR with no MRGs, peripheral pulses intact. ABDOMEN: Soft, non distended, active bowel sounds in all 4 quadrants, non-tender to palpation, no rebound, no mass appreciated. RECTAL: Declines. MUSCULOSKELETAL: Full ROM, antalgic gait, with edema bilateral. SKIN: Dry, intact without rashes or lesions. No jaundice. NEURO: Alert, oriented, no focal deficits. PSYCH: Cooperative, normal mood and affect.      Pam Gottron, PA-C 11:13 AM

## 2024-01-11 ENCOUNTER — Other Ambulatory Visit

## 2024-01-11 ENCOUNTER — Telehealth: Payer: Self-pay

## 2024-01-11 ENCOUNTER — Encounter: Payer: Self-pay | Admitting: Physician Assistant

## 2024-01-11 ENCOUNTER — Ambulatory Visit: Payer: Self-pay | Admitting: Physician Assistant

## 2024-01-11 VITALS — BP 122/70 | HR 52 | Ht 65.75 in | Wt 322.2 lb

## 2024-01-11 DIAGNOSIS — D649 Anemia, unspecified: Secondary | ICD-10-CM

## 2024-01-11 DIAGNOSIS — Z7901 Long term (current) use of anticoagulants: Secondary | ICD-10-CM

## 2024-01-11 DIAGNOSIS — D126 Benign neoplasm of colon, unspecified: Secondary | ICD-10-CM

## 2024-01-11 DIAGNOSIS — I480 Paroxysmal atrial fibrillation: Secondary | ICD-10-CM

## 2024-01-11 DIAGNOSIS — I4891 Unspecified atrial fibrillation: Secondary | ICD-10-CM | POA: Diagnosis not present

## 2024-01-11 DIAGNOSIS — Z6841 Body Mass Index (BMI) 40.0 and over, adult: Secondary | ICD-10-CM

## 2024-01-11 LAB — IBC + FERRITIN
Ferritin: 23 ng/mL (ref 10.0–291.0)
Iron: 81 ug/dL (ref 42–145)
Saturation Ratios: 18.3 % — ABNORMAL LOW (ref 20.0–50.0)
TIBC: 443.8 ug/dL (ref 250.0–450.0)
Transferrin: 317 mg/dL (ref 212.0–360.0)

## 2024-01-11 LAB — VITAMIN B12: Vitamin B-12: 597 pg/mL (ref 211–911)

## 2024-01-11 MED ORDER — NA SULFATE-K SULFATE-MG SULF 17.5-3.13-1.6 GM/177ML PO SOLN: 1.0000 | Freq: Once | ORAL | 0 refills | Status: AC

## 2024-01-11 NOTE — Telephone Encounter (Signed)
 [facility] Pre-operative Risk Assessment     Request for surgical clearance:     Endoscopy Procedure  What type of surgery is being performed?     Colonoscopy  When is this surgery scheduled?     03/17/2024  What type of clearance is required ?   Pharmacy  Are there any medications that need to be held prior to surgery and how long? Eliquis 2 days  Practice name and name of physician performing surgery?      [facility]  What is your office phone and fax number?      Phone- [phone]  Fax- [phone]  Anesthesia type (None, local, MAC, general) ?       MAC   Please route your response to [employer], CMA

## 2024-01-11 NOTE — Patient Instructions (Addendum)
 Your provider has requested that you go to the basement level for lab work before leaving today. Press "B" on the elevator. The lab is located at the first door on the left as you exit the elevator.  Due to recent changes in healthcare laws, you may see the results of your imaging and laboratory studies on MyChart before your provider has had a chance to review them.  We understand that in some cases there may be results that are confusing or concerning to you. Not all laboratory results come back in the same time frame and the provider may be waiting for multiple results in order to interpret others.  Please give [place] 48 hours in order for your provider to thoroughly review all the results before contacting the office for clarification of your results.    You will be contacted by our office prior to your procedure for directions on holding your Eliquis.  If you do not hear from our office 1 week prior to your scheduled procedure, please call [phone] to discuss.   You have been scheduled for a colonoscopy. Please follow written instructions given to you at your visit today.   If you use inhalers (even only as needed), please bring them with you on the day of your procedure.  DO NOT TAKE 7 DAYS PRIOR TO TEST- Trulicity (dulaglutide) Ozempic, Wegovy (semaglutide) Mounjaro (tirzepatide) Bydureon Bcise (exanatide extended release)  DO NOT TAKE 1 DAY PRIOR TO YOUR TEST Rybelsus (semaglutide) Adlyxin (lixisenatide) Victoza (liraglutide) Byetta (exanatide)  _______________________________________________________________________   If your blood pressure at your visit was 140/90 or greater, please contact your primary care physician to follow up on this.  _______________________________________________________  If you are age 83 or older, your body mass index should be between 23-30. Your Body mass index is 52.41 kg/m. If this is out of the aforementioned range listed, please consider  follow up with your Primary Care Provider.  If you are age 17 or younger, your body mass index should be between 19-25. Your Body mass index is 52.41 kg/m. If this is out of the aformentioned range listed, please consider follow up with your Primary Care Provider.   ________________________________________________________  The [place] [facility] providers would like to encourage you to use [facility] to communicate with providers for non-urgent requests or questions.  Due to long hold times on the telephone, sending your provider a message by [facility] may be a faster and more efficient way to get a response.  Please allow 48 business hours for a response.  Please remember that this is for non-urgent requests.  _______________________________________________________

## 2024-01-15 NOTE — Telephone Encounter (Signed)
 I spoke to [facility] and I advise her that her doctor cleared her to hold the Eliquis 2 days prior to her procedure on 03/17/24 and Dr. Rhea Belton will advise her when to restart.  She agreed to the plan and asked about the EGD to be added.  She said that she needed a day or 2 to think about having both done and she will call us back.

## 2024-01-15 NOTE — Telephone Encounter (Signed)
 Patient with diagnosis of A fib on Eliquis for anticoagulation.  Last seen 02/28/23  Procedure: colonoscopy Date of procedure: 03/17/24   CHA2DS2-VASc Score = 3  This indicates a 3.2% annual risk of stroke. The patient's score is based upon: CHF History: 0 HTN History: 1 Diabetes History: 1 Stroke History: 0 Vascular Disease History: 0 Age Score: 0 Gender Score: 1   CrCl 138 ml/min using adjusted body weight Platelet count 161K   Per office protocol, patient can hold Eliquis for 2 days prior to procedure.    **This guidance is not considered finalized until pre-operative APP has relayed final recommendations.**

## 2024-01-15 NOTE — Telephone Encounter (Signed)
   Patient Name: Pam Phillips  DOB: 1961-05-12 MRN: [id number]  Primary Cardiologist: None  Chart reviewed as part of pre-operative protocol coverage. Pre-op clearance already addressed by colleagues in earlier phone notes. To summarize recommendations:  -Per office protocol, patient can hold Eliquis x 2 days prior to the procedure.  Please resume when medically safe to do so.  Medical clearance was not requested.  Will route this bundled recommendation to requesting provider via Epic fax function and remove from pre-op pool. Please call with questions.  Sharlene Dory, PA-C 01/15/2024, 1:39 PM

## 2024-01-19 ENCOUNTER — Other Ambulatory Visit: Payer: Self-pay | Admitting: Family Medicine

## 2024-01-19 DIAGNOSIS — R609 Edema, unspecified: Secondary | ICD-10-CM

## 2024-02-12 ENCOUNTER — Encounter: Payer: Self-pay | Admitting: Cardiovascular Disease

## 2024-02-13 ENCOUNTER — Ambulatory Visit (HOSPITAL_COMMUNITY)
Admission: RE | Admit: 2024-02-13 | Discharge: 2024-02-13 | Disposition: A | Discharge status: Home / Self Care | Payer: Self-pay | Source: Ambulatory Visit | Attending: Physician Assistant | Admitting: Physician Assistant

## 2024-02-13 ENCOUNTER — Encounter (HOSPITAL_COMMUNITY): Payer: Self-pay | Admitting: Physician Assistant

## 2024-02-13 VITALS — BP 132/80 | HR 47 | Ht 65.75 in | Wt 327.6 lb

## 2024-02-13 DIAGNOSIS — I480 Paroxysmal atrial fibrillation: Secondary | ICD-10-CM | POA: Diagnosis not present

## 2024-02-13 DIAGNOSIS — D6869 Other thrombophilia: Secondary | ICD-10-CM | POA: Diagnosis not present

## 2024-02-13 MED ORDER — METOPROLOL TARTRATE 25 MG PO TABS: 12.5000 mg | ORAL_TABLET | Freq: Four times a day (QID) | ORAL | Status: AC | PRN

## 2024-02-13 NOTE — Progress Notes (Signed)
 Primary Care Physician: Crecencio Dodge, Candida Chalk, DO Primary Cardiologist: Dr Alroy Aspen Primary Electrophysiologist: none Referring Physician: Dr Ane Banter Pam Phillips is a 63 y.o. female with a history of HTN, DM, HLD, OSA, atrial fibrillation who presents for follow up in the [facility].  The patient was initially diagnosed with atrial fibrillation 09/11/22 after presenting for a routine colonoscopy with Dr Bridgett Camps. She was noted to have an irregular tachycardia and ECG showed afib with RVR. Cardiology was consulted who recommended Lopressor  25 mg BID with close follow up in the AF clinic. She states that she had noticed some "fluttering" that morning but thought she was just anxious about the procedure. She has had this fluttering sensation in the past, about once every 1-2 months which lasts for about an hour. Her fluttering resolved by the time she left the hospital. She drinks a glass of wine most nights of the week and is compliant with her CPAP. Stated on Eliquis  09/22/22.  Patient returns for follow up for atrial fibrillation. She reports that she has had 4-5 episodes of afib lasting < 2 hours each time. There are no specific triggers that she can identify. She uses her CPAP consistently. She does note fatigue, especially with exertion.   Today, she  denies symptoms of palpitations, chest pain, shortness of breath, orthopnea, PND, lower extremity edema, dizziness, presyncope, syncope, bleeding, or neurologic sequela. The patient is tolerating medications without difficulties and is otherwise without complaint today.    Atrial Fibrillation Risk Factors:  she does have symptoms or diagnosis of sleep apnea. she does not have a history of rheumatic fever. she does have a history of alcohol use. The patient does have a history of early familial atrial fibrillation or other arrhythmias. Mother and brother have afib.   Atrial Fibrillation Management  history:  Previous antiarrhythmic drugs: none Previous cardioversions: none Previous ablations: none Anticoagulation history: Eliquis    Past Medical History:  Diagnosis Date   Ankle swelling 07/04/2012   mostly left ankle -wears compression hose occ.   Anxiety    Atrial fibrillation (HCC)    Cancer (HCC) 07/04/2012   dx. Melanoma in situ -back(mid-scapula area)   Cataract    Heart murmur    Hyperlipidemia    Hypertension    Sleep apnea     Current Outpatient Medications  Medication Sig Dispense Refill   ACCU-CHEK GUIDE test strip USE FOUR TIMES DAILY AS DIRECTED 100 strip 1   amLODipine  (NORVASC ) 2.5 MG tablet TAKE 1 TABLET(2.5 MG) BY MOUTH DAILY 90 tablet 1   apixaban  (ELIQUIS ) 5 MG TABS tablet TAKE 1 TABLET(5 MG) BY MOUTH TWICE DAILY 60 tablet 10   blood glucose meter kit and supplies KIT Dispense based on patient and insurance preference. Use up to four times daily as directed. (FOR ICD-9 250.00, 250.01). 1 each 0   fish oil-omega-3 fatty acids  1000 MG capsule Take 1 g by mouth 2 (two) times daily.      Flaxseed, Linseed, (FLAX SEED OIL) 1000 MG CAPS Take 1,000 mg by mouth in the morning and at bedtime.     FLUoxetine  (PROZAC ) 40 MG capsule Take 1 capsule (40 mg total) by mouth daily. 90 capsule 3   fluticasone  (FLONASE ) 50 MCG/ACT nasal spray Place 2 sprays into both nostrils daily. 16 g 6   furosemide  (LASIX ) 20 MG tablet TAKE 1 TABLET(20 MG) BY MOUTH DAILY 90 tablet 1   GLUCOSAMINE-CHONDROITIN PO Take 1 capsule by mouth 2 (  two) times daily.     KLAYESTA  powder APPLY TO THE AFFECTED AREA THREE TIMES DAILY AS NEEDED 60 g 5   LORazepam  (ATIVAN ) 0.5 MG tablet TAKE 1 TABLET(0.5 MG) BY MOUTH EVERY 8 HOURS AS NEEDED FOR ANXIETY 90 tablet 1   lovastatin  (MEVACOR ) 40 MG tablet Take 1 tablet (40 mg total) by mouth at bedtime. 90 tablet 1   metFORMIN  (GLUCOPHAGE ) 500 MG tablet TAKE 1 TABLET(500 MG) BY MOUTH TWICE DAILY WITH A MEAL 180 tablet 1   metoprolol  tartrate (LOPRESSOR ) 25 MG  tablet Take 0.5 tablets (12.5 mg total) by mouth 2 (two) times daily. 90 tablet 2   Multiple Vitamin (MULTIVITAMIN) capsule Take 1 capsule by mouth daily.     olmesartan -hydrochlorothiazide  (BENICAR  HCT) 40-25 MG tablet Take 1 tablet by mouth daily. 90 tablet 1   OZEMPIC , 1 MG/DOSE, 4 MG/3ML SOPN INJECT 1MG  AS DIRECTED ONCE A WEEK 9 mL 1   polyvinyl alcohol (LIQUIFILM TEARS) 1.4 % ophthalmic solution Place 1 drop into both eyes daily as needed (For dry eyes.).     tolterodine  (DETROL ) 2 MG tablet TAKE 1 TABLET(2 MG) BY MOUTH TWICE DAILY 180 tablet 1   No current facility-administered medications for this encounter.    ROS- All systems are reviewed and negative except as per the HPI above.  Physical Exam: Vitals:   02/13/24 1108  BP: 132/80  Pulse: (!) 47  Weight: (!) 148.6 kg  Height: 5' 5.75" (1.67 m)     GEN: Well nourished, well developed in no acute distress CARDIAC: Regular rate and rhythm, no murmurs, rubs, gallops RESPIRATORY:  Clear to auscultation without rales, wheezing or rhonchi  ABDOMEN: Soft, non-tender, non-distended EXTREMITIES:  No edema; No deformity    Wt Readings from Last 3 Encounters:  02/13/24 (!) 148.6 kg  01/11/24 (!) 146.2 kg  10/15/23 (!) 151.3 kg    EKG today demonstrates  SB Vent. rate 47 BPM PR interval 152 ms QRS duration 80 ms QT/QTcB 448/396 ms   Echo 11/10/22 demonstrated   1. Left ventricular ejection fraction, by estimation, is 60 to 65%. The  left ventricle has normal function. The left ventricle has no regional  wall motion abnormalities. Left ventricular diastolic parameters were  normal. The average left ventricular global longitudinal strain is -23.0 %. The global longitudinal strain is normal.   2. Right ventricular systolic function is normal. The right ventricular  size is normal. Tricuspid regurgitation signal is inadequate for assessing  PA pressure.   3. No evidence of mitral valve regurgitation.   4. The aortic valve  is grossly normal. Aortic valve regurgitation is not  visualized.   5. The inferior vena cava is normal in size with greater than 50%  respiratory variability, suggesting right atrial pressure of 3 mmHg.   Conclusion(s)/Recommendation(s): Normal biventricular function without  evidence of hemodynamically significant valvular heart disease.   Epic records are reviewed at length today  CHA2DS2-VASc Score = 3  The patient's score is based upon: CHF History: 0 HTN History: 1 Diabetes History: 1 Stroke History: 0 Vascular Disease History: 0 Age Score: 0 Gender Score: 1       ASSESSMENT AND PLAN: Paroxysmal Atrial Fibrillation (ICD10:  I48.0) The patient's CHA2DS2-VASc score is 3, indicating a 3.2% annual risk of stroke.   Patient appears to be maintaining SR. Continue Eliquis  5 mg BID Will change Lopressor  to 12.5 mg PRN q 6 hours due to bradycardia and fatigue.  Kardia mobile for home monitoring.  Can consider  AAD if she has more frequent afib.   Secondary Hypercoagulable State (ICD10:  D68.69) The patient is at significant risk for stroke/thromboembolism based upon her CHA2DS2-VASc Score of 3.  Continue Apixaban  (Eliquis ). No bleeding issues.   Obesity Body mass index is 53.28 kg/m.  Encouraged lifestyle modification On Ozempic    OSA  Encouraged nightly CPAP Followed by Dr Omar Bibber  HTN Stable on current regimen   Follow up in the AF clinic in one year.    Myrtha Ates PA-C Af[facility] [address] [place], [place] [address] [phone] 02/13/2024 11:14 AM

## 2024-02-15 ENCOUNTER — Ambulatory Visit: Admitting: Cardiovascular Disease

## 2024-03-03 ENCOUNTER — Encounter: Payer: Self-pay | Admitting: Internal Medicine

## 2024-03-08 ENCOUNTER — Other Ambulatory Visit (HOSPITAL_COMMUNITY): Payer: Self-pay | Admitting: Physician Assistant

## 2024-03-08 ENCOUNTER — Other Ambulatory Visit: Payer: Self-pay | Admitting: Family Medicine

## 2024-03-11 ENCOUNTER — Telehealth: Payer: Self-pay

## 2024-03-11 ENCOUNTER — Encounter (HOSPITAL_COMMUNITY): Payer: Self-pay | Admitting: Internal Medicine

## 2024-03-11 NOTE — Telephone Encounter (Signed)
 I spoke to [facility] and advised her again that her provider approved the 2 day hold on her Eliquis .  She explained that she remembered speaking to someone but but couldn't recall when she could hold the medication.  I advised her to call back if she has any other questions.

## 2024-03-11 NOTE — Telephone Encounter (Signed)
Completed, see telephone note.

## 2024-03-11 NOTE — Telephone Encounter (Signed)
 Procedure:colon Procedure date: 03/17/24 Procedure location: wl Arrival Time: 8:15am Spoke with the patient Y/N: y Any prep concerns? y  Has the patient obtained the prep from the pharmacy ? y Do you have a care partner and transportation: y Any additional concerns? n

## 2024-03-15 ENCOUNTER — Other Ambulatory Visit: Payer: Self-pay | Admitting: Family Medicine

## 2024-03-15 DIAGNOSIS — F32A Depression, unspecified: Secondary | ICD-10-CM

## 2024-03-17 ENCOUNTER — Ambulatory Visit (HOSPITAL_COMMUNITY): Admitting: Anesthesiology

## 2024-03-17 ENCOUNTER — Encounter (HOSPITAL_COMMUNITY)
Admission: RE | Disposition: A | Discharge status: Home / Self Care | Payer: Self-pay | Source: Home / Self Care | Attending: Internal Medicine

## 2024-03-17 ENCOUNTER — Ambulatory Visit (HOSPITAL_COMMUNITY)
Admission: RE | Admit: 2024-03-17 | Discharge: 2024-03-17 | Disposition: A | Discharge status: Home / Self Care | Attending: Internal Medicine | Admitting: Internal Medicine

## 2024-03-17 ENCOUNTER — Encounter (HOSPITAL_COMMUNITY): Payer: Self-pay | Admitting: Internal Medicine

## 2024-03-17 ENCOUNTER — Other Ambulatory Visit: Payer: Self-pay

## 2024-03-17 DIAGNOSIS — G4730 Sleep apnea, unspecified: Secondary | ICD-10-CM | POA: Diagnosis not present

## 2024-03-17 DIAGNOSIS — F32A Depression, unspecified: Secondary | ICD-10-CM | POA: Insufficient documentation

## 2024-03-17 DIAGNOSIS — Z800 Family history of malignant neoplasm of digestive organs: Secondary | ICD-10-CM

## 2024-03-17 DIAGNOSIS — I4891 Unspecified atrial fibrillation: Secondary | ICD-10-CM | POA: Diagnosis not present

## 2024-03-17 DIAGNOSIS — I480 Paroxysmal atrial fibrillation: Secondary | ICD-10-CM

## 2024-03-17 DIAGNOSIS — K648 Other hemorrhoids: Secondary | ICD-10-CM | POA: Diagnosis not present

## 2024-03-17 DIAGNOSIS — D126 Benign neoplasm of colon, unspecified: Secondary | ICD-10-CM

## 2024-03-17 DIAGNOSIS — D125 Benign neoplasm of sigmoid colon: Secondary | ICD-10-CM

## 2024-03-17 DIAGNOSIS — D123 Benign neoplasm of transverse colon: Secondary | ICD-10-CM

## 2024-03-17 DIAGNOSIS — D122 Benign neoplasm of ascending colon: Secondary | ICD-10-CM

## 2024-03-17 DIAGNOSIS — K635 Polyp of colon: Secondary | ICD-10-CM | POA: Diagnosis not present

## 2024-03-17 DIAGNOSIS — Z6841 Body Mass Index (BMI) 40.0 and over, adult: Secondary | ICD-10-CM | POA: Diagnosis not present

## 2024-03-17 DIAGNOSIS — D649 Anemia, unspecified: Secondary | ICD-10-CM

## 2024-03-17 DIAGNOSIS — F419 Anxiety disorder, unspecified: Secondary | ICD-10-CM | POA: Diagnosis not present

## 2024-03-17 DIAGNOSIS — Z860101 Personal history of adenomatous and serrated colon polyps: Secondary | ICD-10-CM

## 2024-03-17 DIAGNOSIS — D120 Benign neoplasm of cecum: Secondary | ICD-10-CM | POA: Diagnosis not present

## 2024-03-17 DIAGNOSIS — Z7984 Long term (current) use of oral hypoglycemic drugs: Secondary | ICD-10-CM | POA: Diagnosis not present

## 2024-03-17 DIAGNOSIS — Z1211 Encounter for screening for malignant neoplasm of colon: Secondary | ICD-10-CM

## 2024-03-17 DIAGNOSIS — E119 Type 2 diabetes mellitus without complications: Secondary | ICD-10-CM | POA: Diagnosis not present

## 2024-03-17 DIAGNOSIS — I10 Essential (primary) hypertension: Secondary | ICD-10-CM | POA: Insufficient documentation

## 2024-03-17 DIAGNOSIS — E6689 Other obesity not elsewhere classified: Secondary | ICD-10-CM | POA: Diagnosis not present

## 2024-03-17 DIAGNOSIS — Z7985 Long-term (current) use of injectable non-insulin antidiabetic drugs: Secondary | ICD-10-CM | POA: Diagnosis not present

## 2024-03-17 DIAGNOSIS — Z79899 Other long term (current) drug therapy: Secondary | ICD-10-CM | POA: Insufficient documentation

## 2024-03-17 HISTORY — PX: POLYPECTOMY: SHX149

## 2024-03-17 HISTORY — PX: COLONOSCOPY: SHX5424

## 2024-03-17 LAB — GLUCOSE, CAPILLARY: Glucose-Capillary: 121 mg/dL — ABNORMAL HIGH (ref 70–99)

## 2024-03-17 SURGERY — COLONOSCOPY
Anesthesia: Monitor Anesthesia Care

## 2024-03-17 MED ORDER — SODIUM CHLORIDE 0.9 % IV SOLN: INTRAVENOUS | Status: DC | PRN

## 2024-03-17 MED ORDER — PROPOFOL 10 MG/ML IV BOLUS
INTRAVENOUS | Status: DC | PRN
  Administered 2024-03-17 (×3): 20 mg via INTRAVENOUS

## 2024-03-17 MED ORDER — SODIUM CHLORIDE 0.9% FLUSH: 3.0000 mL | INTRAVENOUS | Status: DC | PRN

## 2024-03-17 MED ORDER — SODIUM CHLORIDE 0.9% FLUSH: 3.0000 mL | Freq: Two times a day (BID) | INTRAVENOUS | Status: DC

## 2024-03-17 MED ORDER — LIDOCAINE 2% (20 MG/ML) 5 ML SYRINGE
INTRAMUSCULAR | Status: DC | PRN
  Administered 2024-03-17: 40 mg via INTRAVENOUS

## 2024-03-17 MED ORDER — EPHEDRINE SULFATE-NACL 50-0.9 MG/10ML-% IV SOSY
PREFILLED_SYRINGE | INTRAVENOUS | Status: DC | PRN
  Administered 2024-03-17 (×2): 5 mg via INTRAVENOUS

## 2024-03-17 MED ORDER — PROPOFOL 500 MG/50ML IV EMUL
INTRAVENOUS | Status: DC | PRN
  Administered 2024-03-17: 180 ug/kg/min via INTRAVENOUS

## 2024-03-17 NOTE — Anesthesia Postprocedure Evaluation (Signed)
 Anesthesia Post Note  Patient: Pam Phillips  Procedure(s) Performed: COLONOSCOPY POLYPECTOMY, INTESTINE     Patient location during evaluation: PACU Anesthesia Type: MAC Level of consciousness: awake and alert Pain management: pain level controlled Vital Signs Assessment: post-procedure vital signs reviewed and stable Respiratory status: spontaneous breathing, nonlabored ventilation and respiratory function stable Cardiovascular status: blood pressure returned to baseline and stable Postop Assessment: no apparent nausea or vomiting Anesthetic complications: no   No notable events documented.  Last Vitals:  Vitals:   03/17/24 1020 03/17/24 1030  BP: (!) 148/76 (!) 163/88  Pulse: (!) 53 (!) 56  Resp: 14 17  Temp:    SpO2: 96% 97%    Last Pain:  Vitals:   03/17/24 1030  TempSrc:   PainSc: 0-No pain                 Earvin Goldberg

## 2024-03-17 NOTE — Transfer of Care (Signed)
 Immediate Anesthesia Transfer of Care Note  Patient: [name]  Procedure(s) Performed: COLONOSCOPY POLYPECTOMY, INTESTINE  Patient Location: Endoscopy Unit  Anesthesia Type:MAC  Level of Consciousness: drowsy  Airway & Oxygen Therapy: Patient Spontanous Breathing and Patient connected to face mask oxygen  Post-op Assessment: Report given to RN and Post -op Vital signs reviewed and stable  Post vital signs: Reviewed and stable  Last Vitals:  Vitals Value Taken Time  BP    Temp    Pulse 68 03/17/24 1008  Resp 19 03/17/24 1008  SpO2 100 % 03/17/24 1008  Vitals shown include unfiled device data.  Last Pain:  Vitals:   03/17/24 0837  TempSrc: Temporal  PainSc: 0-No pain         Complications: No notable events documented.

## 2024-03-17 NOTE — Op Note (Signed)
 [facility] Patient Name: Pam Phillips Procedure Date: 03/17/2024 MRN: [id number] Attending MD: Nannette Babe , MD, [id number] Date of Birth: 1961/06/07 CSN: [id number] Age: 63 Admit Type: Outpatient Procedure:                Colonoscopy Indications:              High risk colon cancer surveillance: Personal                            history of non-advanced adenomas, Last colonoscopy:                            November 2014 (TA x 2); family hx of colon cancer                            in mother age > 80 yrs. Providers:                Amber Bail. Bridgett Camps, MD, Nadean August, RN, Joline Ned, Technician Referring MD:             Efrain Grant Medicines:                Propofol  per Anesthesia Complications:            No immediate complications. Estimated Blood Loss:     Estimated blood loss was minimal. Procedure:                Pre-Anesthesia Assessment:                           - Prior to the procedure, a History and Physical                            was performed, and patient medications and                            allergies were reviewed. The patient's tolerance of                            previous anesthesia was also reviewed. The risks                            and benefits of the procedure and the sedation                            options and risks were discussed with the patient.                            All questions were answered, and informed consent                            was obtained. Prior Anticoagulants: The patient has  taken Eliquis  (apixaban ), last dose was 2 days                            prior to procedure. ASA Grade Assessment: III - A                            patient with severe systemic disease. After                            reviewing the risks and benefits, the patient was                            deemed in satisfactory condition to undergo the                             procedure.                           After obtaining informed consent, the colonoscope                            was passed under direct vision. Throughout the                            procedure, the patient's blood pressure, pulse, and                            oxygen saturations were monitored continuously. The                            CF-HQ190L ([id number]) Olympus colonoscope was                            introduced through the anus and advanced to the                            cecum, identified by appendiceal orifice and                            ileocecal valve. The colonoscopy was performed                            without difficulty. The patient tolerated the                            procedure well. The quality of the bowel                            preparation was good. The ileocecal valve,                            appendiceal orifice, and rectum were photographed. Scope In: 9:44:08 AM Scope Out: 10:02:37 AM Scope Withdrawal Time: 0 hours 15 minutes 24 seconds  Total Procedure Duration: 0 hours 18 minutes 29  seconds  Findings:      The digital rectal exam was normal.      A 7 mm polyp was found in the ileocecal valve. The polyp was sessile.       The polyp was removed with a cold snare. Resection and retrieval were       complete.      Two sessile polyps were found in the ascending colon. The polyps were 1       to 2 mm in size. These polyps were removed with a cold biopsy forceps.       Resection and retrieval were complete.      Two sessile polyps were found in the transverse colon. The polyps were 3       to 5 mm in size. These polyps were removed with a cold snare. Resection       and retrieval were complete.      Two sessile polyps were found in the sigmoid colon. The polyps were 4 to       5 mm in size. These polyps were removed with a cold snare. Resection and       retrieval were complete.      Internal hemorrhoids were found during retroflexion. The  hemorrhoids       were small. Impression:               - One 7 mm polyp at the ileocecal valve, removed                            with a cold snare. Resected and retrieved.                           - Two 1 to 2 mm polyps in the ascending colon,                            removed with a cold biopsy forceps. Resected and                            retrieved.                           - Two 3 to 5 mm polyps in the transverse colon,                            removed with a cold snare. Resected and retrieved.                           - Two 4 to 5 mm polyps in the sigmoid colon,                            removed with a cold snare. Resected and retrieved.                           - Small internal hemorrhoids with anal skin tag. Moderate Sedation:      N/A Recommendation:           - Patient has a contact number available for  emergencies. The signs and symptoms of potential                            delayed complications were discussed with the                            patient. Return to normal activities tomorrow.                            Written discharge instructions were provided to the                            patient.                           - Resume previous diet.                           - Continue present medications.                           - Resume Eliquis  (apixaban ) at prior dose tomorrow.                            Refer to managing physician for further adjustment                            of therapy.                           - Await pathology results.                           - Repeat colonoscopy is recommended for                            surveillance. The colonoscopy date will be                            determined after pathology results from today's                            exam become available for review. Procedure Code(s):        --- Professional ---                           [phone], Colonoscopy, flexible; with removal  of                            tumor(s), polyp(s), or other lesion(s) by snare                            technique                           45380, 59, Colonoscopy, flexible; with biopsy,  single or multiple Diagnosis Code(s):        --- Professional ---                           Z86.010, Personal history of colonic polyps                           D12.0, Benign neoplasm of cecum                           D12.2, Benign neoplasm of ascending colon                           D12.3, Benign neoplasm of transverse colon (hepatic                            flexure or splenic flexure)                           D12.5, Benign neoplasm of sigmoid colon                           K64.8, Other hemorrhoids CPT copyright 2022 American Medical Association. All rights reserved. The codes documented in this report are preliminary and upon coder review may  be revised to meet current compliance requirements. Nannette Babe, MD 03/17/2024 10:11:47 AM This report has been signed electronically. Number of Addenda: 0

## 2024-03-17 NOTE — H&P (Addendum)
 GASTROENTEROLOGY PROCEDURE H&P NOTE   Primary Care Physician: Estill Hemming, DO    Reason for Procedure:  History of colonic polyps and family history of colon cancer  Plan:    Colonoscopy  Patient is appropriate for endoscopic procedure(s) in the outpatient hospital setting.  The nature of the procedure, as well as the risks, benefits, and alternatives were carefully and thoroughly reviewed with the patient. Ample time for discussion and questions allowed. The patient understood, was satisfied, and agreed to proceed.     HPI: Pam Phillips is a 63 y.o. female who presents for colonoscopy.  Medical history as below.  Tolerated the prep.  No recent chest pain or shortness of breath.  No abdominal pain today.  Eliquis  on hold x 2 days.  Past Medical History:  Diagnosis Date   Ankle swelling 07/04/2012   mostly left ankle -wears compression hose occ.   Anxiety    Atrial fibrillation (HCC)    Cancer (HCC) 07/04/2012   dx. Melanoma in situ -back(mid-scapula area)   Cataract    Heart murmur    Hyperlipidemia    Hypertension    Sleep apnea     Past Surgical History:  Procedure Laterality Date   CHOLECYSTECTOMY  07-04-12   '03-Lap. due to gallstones-[place]    COLONOSCOPY N/A 08/18/2013   Procedure: COLONOSCOPY;  Surgeon: Nannette Babe, MD;  Location: [facility];  Service: Gastroenterology;  Laterality: N/A;   DENTAL SURGERY  07-04-12   2 wisdom teeth and 2 other teeth   MELANOMA EXCISION  07/10/2012   Procedure: MELANOMA EXCISION;  Surgeon: Lockie Rima, MD;  Location: [facility]S;  Service: General;  Laterality: N/A;  excision of back melanoma insitu    Prior to Admission medications   Medication Sig Start Date End Date Taking? Authorizing Provider  amLODipine  (NORVASC ) 2.5 MG tablet TAKE 1 TABLET(2.5 MG) BY MOUTH DAILY 11/26/23  Yes Lowne Chase, Yvonne R, DO  fish oil-omega-3 fatty acids  1000 MG capsule Take 1 g by mouth 2 (two) times daily.  07/10/11  Yes  Lowne Chase, Yvonne R, DO  Flaxseed, Linseed, (FLAX SEED OIL) 1000 MG CAPS Take 1,000 mg by mouth in the morning and at bedtime.   Yes [provider]  furosemide  (LASIX ) 20 MG tablet TAKE 1 TABLET(20 MG) BY MOUTH DAILY 01/21/24  Yes Lowne Chase, Yvonne R, DO  GLUCOSAMINE-CHONDROITIN PO Take 1 capsule by mouth 2 (two) times daily.   Yes [provider]  KLAYESTA  powder APPLY TO THE AFFECTED AREA THREE TIMES DAILY AS NEEDED 04/20/23  Yes Roel Clarity R, DO  LORazepam  (ATIVAN ) 0.5 MG tablet TAKE 1 TABLET(0.5 MG) BY MOUTH EVERY 8 HOURS AS NEEDED FOR ANXIETY 04/13/23  Yes Roel Clarity R, DO  lovastatin  (MEVACOR ) 40 MG tablet Take 1 tablet (40 mg total) by mouth at bedtime. 12/17/23  Yes Roel Clarity R, DO  metFORMIN  (GLUCOPHAGE ) 500 MG tablet TAKE 1 TABLET(500 MG) BY MOUTH TWICE DAILY WITH A MEAL 11/26/23  Yes Roel Clarity R, DO  metoprolol  tartrate (LOPRESSOR ) 25 MG tablet Take 0.5 tablets (12.5 mg total) by mouth every 6 (six) hours as needed (heart racing). 02/13/24 02/12/25 Yes Fenton, Clint R, PA  Multiple Vitamin (MULTIVITAMIN) capsule Take 1 capsule by mouth daily.   Yes [provider]  olmesartan -hydrochlorothiazide  (BENICAR  HCT) 40-25 MG tablet TAKE 1 TABLET BY MOUTH DAILY 03/11/24  Yes Lowne Chase, Yvonne R, DO  polyvinyl alcohol (LIQUIFILM TEARS) 1.4 % ophthalmic solution Place  1 drop into both eyes daily as needed (For dry eyes.).   Yes [provider]  tolterodine  (DETROL ) 2 MG tablet TAKE 1 TABLET(2 MG) BY MOUTH TWICE DAILY 12/31/23  Yes Crecencio Dodge, Yvonne R, DO  ACCU-CHEK GUIDE test strip USE FOUR TIMES DAILY AS DIRECTED 02/26/23   Crecencio Dodge, Candida Chalk, DO  blood glucose meter kit and supplies KIT Dispense based on patient and insurance preference. Use up to four times daily as directed. (FOR ICD-9 250.00, 250.01). 05/23/22   Crecencio Dodge, Candida Chalk, DO  ELIQUIS  5 MG TABS tablet TAKE 1 TABLET(5 MG) BY MOUTH TWICE DAILY 03/11/24   Fenton,  Clint R, PA  FLUoxetine  (PROZAC ) 40 MG capsule TAKE 1 CAPSULE(40 MG) BY MOUTH DAILY 03/17/24   Crecencio Dodge, Yvonne R, DO  fluticasone  (FLONASE ) 50 MCG/ACT nasal spray Place 2 sprays into both nostrils daily. 12/28/23   Blair, Diane W, FNP  OZEMPIC , 1 MG/DOSE, 4 MG/3ML SOPN INJECT 1MG  AS DIRECTED ONCE A WEEK 10/01/23   Roel Clarity R, DO    No current facility-administered medications for this encounter.    Allergies as of 01/11/2024 - Review Complete 01/11/2024  Allergen Reaction Noted   Other Swelling 07/04/2012   Codeine  11/07/2007   Penicillins  11/07/2007    Family History  Problem Relation Age of Onset   Colon polyps Mother    Colon cancer Mother    Alzheimer's disease Mother    Hypertension Father    COPD Father    Heart disease Father 14       MI   Cancer Father 63       prostate ca   Coronary artery disease Other 24       1st degree female   Pancreatic cancer Neg Hx    Stomach cancer Neg Hx    Sleep apnea Neg Hx    Esophageal cancer Neg Hx    Rectal cancer Neg Hx     Social History   Socioeconomic History   Marital status: Married    Spouse name: Not on file   Number of children: Not on file   Years of education: Not on file   Highest education level: Some college, no degree  Occupational History   Occupation: Neurosurgeon    Employer: [employer]  Tobacco Use   Smoking status: Never   Smokeless tobacco: Never   Tobacco comments:    Never smoke 09/22/22  Substance and Sexual Activity   Alcohol use: Yes    Alcohol/week: 7.0 standard drinks of alcohol    Types: 7 Glasses of wine per week    Comment: 1 glass of wine nightly 09/22/22   Drug use: No   Sexual activity: Yes    Partners: Male  Other Topics Concern   Not on file  Social History Narrative   Exercise-- not right now   Social Drivers of Health   Financial Resource Strain: Low Risk  (10/11/2023)   Overall Financial Resource Strain (CARDIA)    Difficulty of Paying Living  Expenses: Not very hard  Food Insecurity: No Food Insecurity (10/11/2023)   Hunger Vital Sign    Worried About Running Out of Food in the Last Year: Never true    Ran Out of Food in the Last Year: Never true  Transportation Needs: No Transportation Needs (10/11/2023)   PRAPARE - Administrator, Civil Service (Medical): No    Lack of Transportation (Non-Medical): No  Physical Activity: Insufficiently Active (  10/11/2023)   Exercise Vital Sign    Days of Exercise per Week: 2 days    Minutes of Exercise per Session: 30 min  Stress: Stress Concern Present (10/11/2023)   [employer] of Occupational Health - Occupational Stress Questionnaire    Feeling of Stress : To some extent  Social Connections: Moderately Integrated (10/11/2023)   Social Connection and Isolation Panel [NHANES]    Frequency of Communication with Friends and Family: Twice a week    Frequency of Social Gatherings with Friends and Family: Once a week    Attends Religious Services: Never    Database administrator or Organizations: Yes    Attends Banker Meetings: Never    Marital Status: Married  Catering manager Violence: Not on file    Physical Exam: Vital signs in last 24 hours: @BP  136/64   Pulse (!) 51   Temp 97.8 F (36.6 C) (Temporal)   Resp 13   Ht 5' 5.75" (1.67 m)   Wt (!) 147.4 kg   LMP 06/26/2012   SpO2 100%   BMI 52.85 kg/m  GEN: NAD EYE: Sclerae anicteric ENT: MMM CV: Non-tachycardic Pulm: CTA b/l GI: Soft, NT/ND NEURO:  Alert & Oriented x 3   Laurell Pond, MD [facility]  03/17/2024 9:15 AM

## 2024-03-17 NOTE — Discharge Instructions (Signed)
 YOU HAD AN ENDOSCOPIC PROCEDURE TODAY: Refer to the procedure report and other information in the discharge instructions given to you for any specific questions about what was found during the examination. If this information does not answer your questions, please call [place] office at [phone] to clarify.   YOU SHOULD EXPECT: Some feelings of bloating in the abdomen. Passage of more gas than usual. Walking can help get rid of the air that was put into your GI tract during the procedure and reduce the bloating. If you had a lower endoscopy (such as a colonoscopy or flexible sigmoidoscopy) you may notice spotting of blood in your stool or on the toilet paper. Some abdominal soreness may be present for a day or two, also.  DIET: Your first meal following the procedure should be a light meal and then it is ok to progress to your normal diet. A half-sandwich or bowl of soup is an example of a good first meal. Heavy or fried foods are harder to digest and may make you feel nauseous or bloated. Drink plenty of fluids but you should avoid alcoholic beverages for 24 hours. If you had a esophageal dilation, please see attached instructions for diet.    ACTIVITY: Your care partner should take you home directly after the procedure. You should plan to take it easy, moving slowly for the rest of the day. You can resume normal activity the day after the procedure however YOU SHOULD NOT DRIVE, use power tools, machinery or perform tasks that involve climbing or major physical exertion for 24 hours (because of the sedation medicines used during the test).   SYMPTOMS TO REPORT IMMEDIATELY: A gastroenterologist can be reached at any hour. Please call [phone]  for any of the following symptoms:  Following lower endoscopy (colonoscopy, flexible sigmoidoscopy) Excessive amounts of blood in the stool  Significant tenderness, worsening of abdominal pains  Swelling of the abdomen that is new, acute  Fever of 100 or  higher   FOLLOW UP:  If any biopsies were taken you will be contacted by phone or by letter within the next 1-3 weeks. Call [phone]  if you have not heard about the biopsies in 3 weeks.  Please also call with any specific questions about appointments or follow up tests.

## 2024-03-17 NOTE — Anesthesia Preprocedure Evaluation (Signed)
 Anesthesia Evaluation  Patient identified by MRN, date of birth, ID band Patient awake    Reviewed: Allergy & Precautions, H&P , NPO status , Patient's Chart, lab work & pertinent test results  Airway Mallampati: II  TM Distance: >3 FB Neck ROM: full    Dental no notable dental hx. (+) Teeth Intact, Dental Advisory Given   Pulmonary shortness of breath and with exertion, sleep apnea    Pulmonary exam normal breath sounds clear to auscultation       Cardiovascular hypertension, Pt. on medications + dysrhythmias Atrial Fibrillation  Rhythm:regular Rate:Normal     Neuro/Psych   Anxiety Depression    negative neurological ROS  negative psych ROS   GI/Hepatic negative GI ROS, Neg liver ROS,,,  Endo/Other  diabetes, Type 2  Class 4 obesity  Renal/GU negative Renal ROS  negative genitourinary   Musculoskeletal   Abdominal  (+) + obese  Peds  Hematology negative hematology ROS (+)   Anesthesia Other Findings   Reproductive/Obstetrics negative OB ROS                             Anesthesia Physical Anesthesia Plan  ASA: III  Anesthesia Plan: MAC   Post-op Pain Management: Minimal or no pain anticipated   Induction: Intravenous  PONV Risk Score and Plan: 2 and Propofol  infusion and Treatment may vary due to age or medical condition  Airway Management Planned: Simple Face Mask  Additional Equipment:   Intra-op Plan:   Post-operative Plan:   Informed Consent: I have reviewed the patients History and Physical, chart, labs and discussed the procedure including the risks, benefits and alternatives for the proposed anesthesia with the patient or authorized representative who has indicated his/her understanding and acceptance.     Dental Advisory Given  Plan Discussed with: CRNA and Surgeon  Anesthesia Plan Comments:         Anesthesia Quick Evaluation

## 2024-03-18 ENCOUNTER — Encounter (HOSPITAL_COMMUNITY): Payer: Self-pay | Admitting: Internal Medicine

## 2024-03-18 LAB — SURGICAL PATHOLOGY

## 2024-03-19 ENCOUNTER — Ambulatory Visit: Payer: Self-pay | Admitting: Internal Medicine

## 2024-03-26 ENCOUNTER — Encounter: Payer: Self-pay | Admitting: Family Medicine

## 2024-03-27 ENCOUNTER — Other Ambulatory Visit: Payer: Self-pay | Admitting: Family Medicine

## 2024-03-27 DIAGNOSIS — E1165 Type 2 diabetes mellitus with hyperglycemia: Secondary | ICD-10-CM

## 2024-03-27 MED ORDER — OZEMPIC (1 MG/DOSE) 4 MG/3ML ~~LOC~~ SOPN: 1.0000 mg | PEN_INJECTOR | SUBCUTANEOUS | 1 refills | Status: DC

## 2024-03-27 NOTE — Telephone Encounter (Signed)
 Would you like to increase the dose?

## 2024-04-07 ENCOUNTER — Encounter: Payer: Self-pay | Admitting: Cardiovascular Disease

## 2024-04-07 NOTE — Progress Notes (Signed)
 No show  This encounter was created in error - please disregard.

## 2024-04-08 ENCOUNTER — Encounter: Admitting: Cardiovascular Disease

## 2024-04-12 ENCOUNTER — Telehealth: Admitting: Nurse Practitioner

## 2024-04-12 DIAGNOSIS — H60332 Swimmer's ear, left ear: Secondary | ICD-10-CM

## 2024-04-12 MED ORDER — ACETIC ACID 2 % OT SOLN: 4.0000 [drp] | Freq: Four times a day (QID) | OTIC | 0 refills | Status: DC

## 2024-04-12 NOTE — Progress Notes (Signed)
 I have spent 5 minutes in review of e-visit questionnaire, review and updating patient chart, medical decision making and response to patient.   Claiborne Rigg, NP

## 2024-04-12 NOTE — Progress Notes (Signed)
 E Visit for Ear Pain - Swimmer's Ear  We are sorry that you are not feeling well. Here is how we plan to help!  Based on what you have shared with me it looks like you have Swimmer's Ear.  Swimmer's ear is a redness or swelling, irritation, or infection of your outer ear canal. These symptoms usually occur within a few days of swimming. Your ear canal is a tube that goes from the opening of the ear to the eardrum.  When water stays in your ear canal, germs can grow.  This is a painful condition that often happens to children and swimmers of all ages.  It is not contagious and oral antibiotics are not required to treat uncomplicated swimmer's ear.  The usual symptoms include:    Itchiness inside the ear  Redness or a sense of swelling in the ear  Pain when the ear is tugged on when pressure is placed on the ear  Pus draining from the infected ear    I have prescribed Acetic acid 2% otic solution 4 drops in affected ear(s) every four times per day for 7 days  In certain cases, swimmer's ear may progress to a more serious bacterial infection of the middle or inner ear.  If you have a fever 102 and up and significantly worsening symptoms, this could indicate a more serious infection moving to the middle/inner and needs face to face evaluation in an office by a provider.  Your symptoms should improve over the next 3 days and should resolve in about 7 days.  Be sure to complete ALL of your prescription.  HOME CARE: Wash your hands frequently. If you are prescribed an ear drop, do not place the tip of the bottle on your ear or touch it with your fingers. You can take Acetaminophen  650 mg every 4-6 hours as needed for pain.  If pain is severe or moderate, you can apply a heating pad (set on low) or hot water bottle (wrapped in a towel) to outer ear for 20 minutes.  This will also increase drainage. Avoid ear plugs Do not go swimming until the symptoms are gone Do not use Q-tips After showers, help  the water run out by tilting your head to one side.   GET HELP RIGHT AWAY IF: Fever is over 102.2 degrees. You develop progressive ear pain or hearing loss. Ear symptoms persist longer than 3 days after treatment.  MAKE SURE YOU: Understand these instructions. Will watch your condition. Will get help right away if you are not doing well or get worse.  TO PREVENT SWIMMER'S EAR: Use a bathing cap or custom fitted swim molds to keep your ears dry. Towel off after swimming to dry your ears. Tilt your head or pull your earlobes to allow the water to escape your ear canal. If there is still water in your ears, consider using a hairdryer on the lowest setting.  Thank you for choosing an e-visit.  Your e-visit answers were reviewed by a board certified advanced clinical practitioner to complete your personal care plan. Depending upon the condition, your plan could have included both over the counter or prescription medications.  Please review your pharmacy choice. Make sure the pharmacy is open so you can pick up the prescription now. If there is a problem, you may contact your provider through [employer] and have the prescription routed to another pharmacy.  Your safety is important to us . If you have drug allergies check your prescription carefully.  For the next 24 hours you can use MyChart to ask questions about today's visit, request a non-urgent call back, or ask for a work or school excuse. You will get an email with a survey after your eVisit asking about your experience. We would appreciate your feedback. I hope that your e-visit has been valuable and will aid in your recovery.

## 2024-04-14 ENCOUNTER — Ambulatory Visit: Payer: BC Managed Care – PPO | Admitting: Family Medicine

## 2024-04-20 ENCOUNTER — Other Ambulatory Visit: Payer: Self-pay | Admitting: Family Medicine

## 2024-04-25 ENCOUNTER — Ambulatory Visit: Admitting: Family Medicine

## 2024-04-25 ENCOUNTER — Ambulatory Visit (INDEPENDENT_AMBULATORY_CARE_PROVIDER_SITE_OTHER): Admitting: Physician Assistant

## 2024-04-25 ENCOUNTER — Encounter: Payer: Self-pay | Admitting: Physician Assistant

## 2024-04-25 VITALS — BP 154/67 | HR 53 | Ht 65.75 in | Wt 321.4 lb

## 2024-04-25 DIAGNOSIS — H6122 Impacted cerumen, left ear: Secondary | ICD-10-CM

## 2024-04-25 DIAGNOSIS — M25512 Pain in left shoulder: Secondary | ICD-10-CM

## 2024-04-25 NOTE — Progress Notes (Signed)
 Established patient visit   Patient: Pam Phillips   DOB: 04/21/1961   62 y.o. Female  MRN: [id number] Visit Date: 04/25/2024  Today's healthcare provider: Manuelita Flatness, PA-C   Cc. Left ear hearing loss,  left shoulder pain  Subjective     Pt was seen virtually, dx with swimmers ear and started on acetic acid  drops 04/12/24.   Pt reports today, denies pain, just unable to hear on the left side.   She also reports L shoulder pain x 1 mo, lack of ROM and pain with reaching sideways, backwards. Denies known injury.  Medications: Outpatient Medications Prior to Visit  Medication Sig   ACCU-CHEK GUIDE TEST test strip USE FOUR TIMES DAILY AS DIRECTED   acetic acid  2 % otic solution Place 4 drops into the left ear 4 (four) times daily.   amLODipine  (NORVASC ) 2.5 MG tablet TAKE 1 TABLET(2.5 MG) BY MOUTH DAILY   blood glucose meter kit and supplies KIT Dispense based on patient and insurance preference. Use up to four times daily as directed. (FOR ICD-9 250.00, 250.01).   ELIQUIS  5 MG TABS tablet TAKE 1 TABLET(5 MG) BY MOUTH TWICE DAILY   fish oil-omega-3 fatty acids  1000 MG capsule Take 1 g by mouth 2 (two) times daily.    Flaxseed, Linseed, (FLAX SEED OIL) 1000 MG CAPS Take 1,000 mg by mouth in the morning and at bedtime.   FLUoxetine  (PROZAC ) 40 MG capsule TAKE 1 CAPSULE(40 MG) BY MOUTH DAILY   fluticasone  (FLONASE ) 50 MCG/ACT nasal spray Place 2 sprays into both nostrils daily.   furosemide  (LASIX ) 20 MG tablet TAKE 1 TABLET(20 MG) BY MOUTH DAILY   GLUCOSAMINE-CHONDROITIN PO Take 1 capsule by mouth 2 (two) times daily.   KLAYESTA  powder APPLY TO THE AFFECTED AREA THREE TIMES DAILY AS NEEDED   LORazepam  (ATIVAN ) 0.5 MG tablet TAKE 1 TABLET(0.5 MG) BY MOUTH EVERY 8 HOURS AS NEEDED FOR ANXIETY   lovastatin  (MEVACOR ) 40 MG tablet Take 1 tablet (40 mg total) by mouth at bedtime.   metFORMIN  (GLUCOPHAGE ) 500 MG tablet TAKE 1 TABLET(500 MG) BY MOUTH TWICE DAILY WITH A MEAL    metoprolol  tartrate (LOPRESSOR ) 25 MG tablet Take 0.5 tablets (12.5 mg total) by mouth every 6 (six) hours as needed (heart racing).   Multiple Vitamin (MULTIVITAMIN) capsule Take 1 capsule by mouth daily.   olmesartan -hydrochlorothiazide  (BENICAR  HCT) 40-25 MG tablet TAKE 1 TABLET BY MOUTH DAILY   polyvinyl alcohol (LIQUIFILM TEARS) 1.4 % ophthalmic solution Place 1 drop into both eyes daily as needed (For dry eyes.).   Semaglutide , 1 MG/DOSE, (OZEMPIC , 1 MG/DOSE,) 4 MG/3ML SOPN Inject 1 mg into the skin once a week.   tolterodine  (DETROL ) 2 MG tablet TAKE 1 TABLET(2 MG) BY MOUTH TWICE DAILY   No facility-administered medications prior to visit.    Review of Systems  Constitutional:  Negative for fatigue and fever.  HENT:  Positive for hearing loss.   Respiratory:  Negative for cough and shortness of breath.   Cardiovascular:  Negative for chest pain and leg swelling.  Gastrointestinal:  Negative for abdominal pain.  Musculoskeletal:  Positive for arthralgias and myalgias.  Neurological:  Negative for dizziness and headaches.       Objective    BP (!) 154/67   Pulse (!) 53   Ht 5' 5.75 (1.67 m)   Wt (!) 321 lb 6.4 oz (145.8 kg)   LMP 06/26/2012   BMI 52.27 kg/m    Physical Exam  Vitals reviewed.  Constitutional:      Appearance: She is not ill-appearing.  HENT:     Head: Normocephalic.     Right Ear: There is impacted cerumen.     Left Ear: There is impacted cerumen.  Eyes:     Conjunctiva/sclera: Conjunctivae normal.  Cardiovascular:     Rate and Rhythm: Normal rate.  Pulmonary:     Effort: Pulmonary effort is normal. No respiratory distress.  Musculoskeletal:     Comments: No tenderness on exam, but L shoulder with very limited ROM 2/2 to pain  Neurological:     Mental Status: She is alert and oriented to person, place, and time.  Psychiatric:        Mood and Affect: Mood normal.        Behavior: Behavior normal.      No results found for any visits on  04/25/24.  Assessment & Plan    Impacted cerumen, left ear  Acute pain of left shoulder -     Ambulatory referral to Physical Therapy   L ear flushed; not much obtained. Recommending 1 week of debrox drops otc and f/b to attempt to reflush .   Return if symptoms worsen or fail to improve.       Manuelita Flatness, PA-C  [facility] at [facility] [phone] (phone) [phone] (fax)  [facility]

## 2024-05-05 ENCOUNTER — Ambulatory Visit: Admitting: Family Medicine

## 2024-05-08 ENCOUNTER — Encounter: Payer: Self-pay | Admitting: Family Medicine

## 2024-05-08 ENCOUNTER — Ambulatory Visit (INDEPENDENT_AMBULATORY_CARE_PROVIDER_SITE_OTHER): Admitting: Family Medicine

## 2024-05-08 VITALS — BP 128/88 | HR 55 | Temp 98.2°F | Resp 18 | Ht 65.75 in | Wt 320.6 lb

## 2024-05-08 DIAGNOSIS — E1165 Type 2 diabetes mellitus with hyperglycemia: Secondary | ICD-10-CM

## 2024-05-08 DIAGNOSIS — I480 Paroxysmal atrial fibrillation: Secondary | ICD-10-CM

## 2024-05-08 DIAGNOSIS — I10 Essential (primary) hypertension: Secondary | ICD-10-CM

## 2024-05-08 DIAGNOSIS — E1169 Type 2 diabetes mellitus with other specified complication: Secondary | ICD-10-CM | POA: Diagnosis not present

## 2024-05-08 DIAGNOSIS — E785 Hyperlipidemia, unspecified: Secondary | ICD-10-CM | POA: Diagnosis not present

## 2024-05-08 DIAGNOSIS — Z7985 Long-term (current) use of injectable non-insulin antidiabetic drugs: Secondary | ICD-10-CM

## 2024-05-08 DIAGNOSIS — H60331 Swimmer's ear, right ear: Secondary | ICD-10-CM

## 2024-05-08 MED ORDER — OFLOXACIN 0.3 % OT SOLN: 10.0000 [drp] | Freq: Every day | OTIC | 0 refills | Status: DC

## 2024-05-08 NOTE — Assessment & Plan Note (Signed)
 Encourage heart healthy diet such as MIND or DASH diet, increase exercise, avoid trans fats, simple carbohydrates and processed foods, consider a krill or fish or flaxseed oil cap daily.

## 2024-05-08 NOTE — Patient Instructions (Signed)
 Earwax Buildup, Adult Your ears make something called earwax. It helps keep germs called bacteria away and protects the skin in your ears. Sometimes, too much earwax can build up. This can cause discomfort or make it harder to hear. What are the causes? Earwax buildup can happen when you have too much earwax in your ears. Earwax is made in the outer part of your ear canal. It's supposed to fall out in small amounts over time. But if your ears aren't able to clean themselves like they should, earwax can build up. What increases the risk? You're more likely to get earwax buildup if: You clean your ears with cotton swabs. You pick at your ears. You use earplugs or in-ear headphones a lot. You wear hearing aids. You may also be more likely to get it if: You're female. You're older. Your ears naturally make more earwax. You have narrow ear canals or extra hair in your ears. Your earwax is too thick or sticky. You have eczema. You're dehydrated. This means there's not enough fluid in your body. What are the signs or symptoms? Symptoms of earwax buildup include: Not being able to hear as well. A feeling of fullness in your ear. Feeling like your ear is plugged. Fluid coming from your ear. Ear pain or an itchy ear. Ringing in your ear. Coughing or problems with balance. How is this diagnosed? Earwax buildup may be diagnosed based on your symptoms, medical history, and an ear exam. During the exam, your health care provider will look into your ear with a tool called an otoscope. You may also have tests, such as a hearing test. How is this treated? Earwax buildup may be treated by: Using ear drops. Having the earwax removed by a provider. The provider may: Flush the ear with water. Use a tool called a curette that has a loop on the end. Use a suction device. Having surgery. This may be done in severe cases. Follow these instructions at home:  Cleaning your ears Clean your ears as told  by your provider. You can clean the outside of your ears with a washcloth or tissue. Do not overclean your ears. Do not put anything into your ear unless told. This includes cotton swabs. General instructions Take over-the-counter and prescription medicines only as told by your provider. Drink enough fluid to keep your pee (urine) pale yellow. This helps thin the earwax. If you have hearing aids, clean them as told. Keep all follow-up visits. If earwax builds up in your ears often or if you use hearing aids, ask your provider how often you should have your ears cleaned. Contact a health care provider if: Your ear pain gets worse. You have a fever. You have pus, blood, or other fluid coming from your ear. You have hearing loss. You have ringing in your ears that won't go away. You feel like the room is spinning. This is called vertigo. Your symptoms don't get better with treatment. This information is not intended to replace advice given to you by your health care provider. Make sure you discuss any questions you have with your health care provider. Document Revised: 12/14/2022 Document Reviewed: 12/14/2022 Elsevier Patient Education  2024 [employer].

## 2024-05-08 NOTE — Assessment & Plan Note (Signed)
 Well controlled, no changes to meds. Encouraged heart healthy diet such as the DASH diet and exercise as tolerated.

## 2024-05-08 NOTE — Progress Notes (Signed)
 + +   Established Patient Office Visit  Subjective   Patient ID: Pam Phillips, female    DOB: 30-Oct-1960  Age: 63 y.o. MRN: [id number]  Chief Complaint  Patient presents with   Diabetes    HPI Discussed the use of AI scribe software for clinical note transcription with the patient, who gave verbal consent to proceed.  History of Present Illness Pam Phillips is a 63 year old female who presents for a six-month checkup and evaluation of ear wax buildup.  She has been experiencing ear wax buildup, initially suspecting swimmer's ear due to associated pain. After an online consultation, she was prescribed medication. A subsequent clinic visit involved an attempt to flush out the wax, resulting in partial removal. She used Debrox for four to five days and attempted flushing with water and hydrogen peroxide at home. Despite these efforts, she continues to experience hearing difficulties.  She uses earplugs while swimming, which she suspects might have contributed to the wax buildup. She denies using Q-tips.  She is currently on 1 mg of Ozempic  and has lost about sixty pounds from her highest recorded weight. No nausea or vomiting, but she notes that 'sometimes things go through you a little quicker.'  She recently had a colonoscopy with no issues. She visited the eye doctor in April and received a new prescription. She usually sees Dr. Carletha at [facility] in [place].  She reports that she usually gets her mammogram in September or October and typically has her bone density test done through her gynecologist, Dr. German at [facility], but does not recall having it done this year.  She was referred to physical therapy for arm pain, which she describes as a gradual onset without a specific injury. The pain is located in the shoulder area, and she is undergoing exercises for what is suspected to be an issue with the supraspinatus muscle.  No nausea, vomiting,  or constipation.   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   Paroxysmal atrial fibrillation (HCC) 09/22/2022   Hypercoagulable state due to paroxysmal atrial fibrillation (HCC) 09/22/2022   Sleep apnea 05/16/2022   Edema 08/15/2021   Closed fracture of first metacarpal bone of left hand 10/25/2020   Uncontrolled type 2 diabetes mellitus with hyperglycemia (HCC) 12/02/2019   Acute upper respiratory infection 08/31/2014   Preventative health care 08/18/2013   Benign neoplasm of colon 08/18/2013   Morbid obesity with BMI of 50.0-59.9, adult (HCC) 05/23/2013   Melanoma in situ of back (HCC) 06/18/2012   Snoring 01/02/2011   MORBID OBESITY 07/04/2010   Hyperlipidemia associated with type 2 diabetes mellitus (HCC) 12/16/2009   INCONTINENCE, URGE 11/07/2007   Anxiety and depression 11/06/2006   Primary hypertension 11/06/2006   Past Medical History:  Diagnosis Date   Ankle swelling 07/04/2012   mostly left ankle -wears compression hose occ.   Anxiety    Atrial fibrillation (HCC)    Cancer (HCC) 07/04/2012   dx. Melanoma in situ -back(mid-scapula area)   Cataract    Heart murmur    Hyperlipidemia    Hypertension    Sleep apnea    Past Surgical History:  Procedure Laterality Date   CHOLECYSTECTOMY  07-04-12   '03-Lap. due to gallstones-[place]    COLONOSCOPY N/A 08/18/2013   Procedure: COLONOSCOPY;  Surgeon:  Gordy CHRISTELLA Starch, MD;  Location: [facility];  Service: Gastroenterology;  Laterality: N/A;   COLONOSCOPY N/A 03/17/2024   Procedure: COLONOSCOPY;  Surgeon: Starch Gordy CHRISTELLA, MD;  Location: [facility];  Service: Gastroenterology;  Laterality: N/A;   DENTAL SURGERY  07-04-12   2 wisdom teeth and 2 other teeth   MELANOMA EXCISION  07/10/2012   Procedure: MELANOMA EXCISION;  Surgeon: Jina Nephew, MD;  Location: [facility];  Service: General;  Laterality: N/A;  excision of back melanoma insitu   POLYPECTOMY  03/17/2024   Procedure: POLYPECTOMY, INTESTINE;  Surgeon: Starch Gordy CHRISTELLA, MD;  Location: [facility];  Service: Gastroenterology;;   Social History   Tobacco Use   Smoking status: Never   Smokeless tobacco: Never   Tobacco comments:    Never smoke 09/22/22  Substance Use Topics   Alcohol use: Yes    Alcohol/week: 7.0 standard drinks of alcohol    Types: 7 Glasses of wine per week    Comment: 1 glass of wine nightly 09/22/22   Drug use: No   Social History   Socioeconomic History   Marital status: Married    Spouse name: Not on file   Number of children: Not on file   Years of education: Not on file   Highest education level: Some college, no degree  Occupational History   Occupation: Neurosurgeon    Employer: [employer]  Tobacco Use   Smoking status: Never   Smokeless tobacco: Never   Tobacco comments:    Never smoke 09/22/22  Substance and Sexual Activity   Alcohol use: Yes    Alcohol/week: 7.0 standard drinks of alcohol    Types: 7 Glasses of wine per week    Comment: 1 glass of wine nightly 09/22/22   Drug use: No   Sexual activity: Yes    Partners: Male  Other Topics Concern   Not on file  Social History Narrative   Exercise-- not right now   Social Drivers of Health   Financial Resource Strain: Low Risk  (04/24/2024)   Overall Financial Resource Strain (CARDIA)    Difficulty of Paying Living Expenses: Not very hard  Food Insecurity: No Food Insecurity (04/24/2024)   Hunger Vital Sign    Worried About Running Out of Food in the Last Year: Never true    Ran Out of Food in the Last Year: Never true  Transportation Needs: No Transportation Needs (04/24/2024)   PRAPARE - Administrator, Civil Service (Medical): No    Lack of Transportation (Non-Medical): No  Physical Activity: Insufficiently Active (04/24/2024)   Exercise  Vital Sign    Days of Exercise per Week: 4 days    Minutes of Exercise per Session: 20 min  Stress: Stress Concern Present (04/24/2024)   [employer] of Occupational Health - Occupational Stress Questionnaire    Feeling of Stress: To some extent  Social Connections: Moderately Isolated (04/24/2024)   Social Connection and Isolation Panel    Frequency of Communication with Friends and Family: Twice a week    Frequency of Social Gatherings with Friends and Family: Once a week    Attends Religious Services: Never    Database administrator or Organizations: No    Attends Engineer, structural: Not on file    Marital Status: Married  Catering manager Violence: Not on file   Family Status  Relation Name Status   Mother  Alive   Father  Deceased   Brother  Alive   Other  (Not Specified)   Neg Hx  (Not Specified)  No partnership data on file   Family History  Problem Relation Age of Onset   Colon polyps Mother    Colon cancer Mother    Alzheimer's disease Mother    Hypertension Father    COPD Father    Heart disease Father 68       MI   Cancer Father 96       prostate ca   Coronary artery disease Other 38       1st degree female   Pancreatic cancer Neg Hx    Stomach cancer Neg Hx    Sleep apnea Neg Hx    Esophageal cancer Neg Hx    Rectal cancer Neg Hx    Allergies  Allergen Reactions   Other Swelling    Peaches(fresh) causes throat itching-swelling   Codeine     Nausea & vomiting   Penicillins     angioedema    Review of Systems  Constitutional:  Negative for chills, fever and malaise/fatigue.  HENT:  Negative for congestion and hearing loss.   Eyes:  Negative for discharge.  Respiratory:  Negative for cough, sputum production and shortness of breath.   Cardiovascular:  Negative for chest pain, palpitations and leg swelling.  Gastrointestinal:  Negative for abdominal pain, blood in stool, constipation, diarrhea, heartburn, nausea and vomiting.   Genitourinary:  Negative for dysuria, frequency, hematuria and urgency.  Musculoskeletal:  Negative for back pain, falls and myalgias.  Skin:  Negative for rash.  Neurological:  Negative for dizziness, sensory change, loss of consciousness, weakness and headaches.  Endo/Heme/Allergies:  Negative for environmental allergies. Does not bruise/bleed easily.  Psychiatric/Behavioral:  Negative for depression and suicidal ideas. The patient is not nervous/anxious and does not have insomnia.       Objective:     BP 128/88 (BP Location: Left Arm, Patient Position: Sitting, Cuff Size: Large)   Pulse (!) 55   Temp 98.2 F (36.8 C) (Oral)   Resp 18   Ht 5' 5.75 (1.67 m)   Wt (!) 320 lb 9.6 oz (145.4 kg)   LMP 06/26/2012   SpO2 98%   BMI 52.14 kg/m  BP Readings from Last 3 Encounters:  05/08/24 128/88  04/25/24 (!) 154/67  03/17/24 (!) 163/88   Wt Readings from Last 3 Encounters:  05/08/24 (!) 320 lb 9.6 oz (145.4 kg)  04/25/24 (!) 321 lb 6.4 oz (145.8 kg)  03/17/24 (!) 324 lb 15.3 oz (147.4 kg)   SpO2 Readings from Last 3 Encounters:  05/08/24 98%  03/17/24 97%  10/15/23 98%      Physical Exam Vitals and nursing note reviewed.  Constitutional:      General: She is not in acute distress.    Appearance: Normal appearance. She is well-developed.  HENT:     Head: Normocephalic and atraumatic.     Right Ear: Tympanic membrane normal.     Left Ear: There is impacted cerumen.     Ears:     Comments: Unable to remove wax with hoop--- ear irrigated with no complications Canal erythematous , tmi  Eyes:     General: No scleral icterus.       Right eye: No discharge.        Left eye: No discharge.  Cardiovascular:     Rate and Rhythm: Normal rate and regular rhythm.     Heart sounds: No murmur heard. Pulmonary:  Effort: Pulmonary effort is normal. No respiratory distress.  Musculoskeletal:        General: Normal range of motion.     Cervical back: Normal range of motion  and neck supple.     Right lower leg: No edema.     Left lower leg: No edema.  Skin:    General: Skin is warm and dry.  Neurological:     General: No focal deficit present.     Mental Status: She is alert and oriented to person, place, and time.  Psychiatric:        Mood and Affect: Mood normal.        Behavior: Behavior normal.        Thought Content: Thought content normal.        Judgment: Judgment normal.      No results found for any visits on 05/08/24.  Last CBC Lab Results  Component Value Date   WBC 6.9 10/15/2023   HGB 11.2 (L) 10/15/2023   HCT 34.1 (L) 10/15/2023   MCV 97.0 10/15/2023   MCH 32.5 11/10/2022   RDW 13.1 10/15/2023   PLT 161.0 10/15/2023   Last metabolic panel Lab Results  Component Value Date   GLUCOSE 95 10/15/2023   NA 140 10/15/2023   K 4.1 10/15/2023   CL 103 10/15/2023   CO2 28 10/15/2023   BUN 13 10/15/2023   CREATININE 0.62 10/15/2023   GFR 94.82 10/15/2023   CALCIUM 9.1 10/15/2023   PROT 6.5 10/15/2023   ALBUMIN 4.0 10/15/2023   BILITOT 0.8 10/15/2023   ALKPHOS 54 10/15/2023   AST 12 10/15/2023   ALT 12 10/15/2023   ANIONGAP 10 11/10/2022   Last lipids Lab Results  Component Value Date   CHOL 130 10/15/2023   HDL 47.30 10/15/2023   LDLCALC 64 10/15/2023   TRIG 97.0 10/15/2023   CHOLHDL 3 10/15/2023   Last hemoglobin A1c Lab Results  Component Value Date   HGBA1C 5.4 10/15/2023   Last thyroid  functions Lab Results  Component Value Date   TSH 2.072 09/22/2022   Last vitamin D No results found for: MARIEN BOLLS, VD25OH Last vitamin B12 and Folate Lab Results  Component Value Date   VITAMINB12 597 01/11/2024      The 10-year ASCVD risk score (Arnett DK, et al., 2019) is: 9.5%    Assessment & Plan:   Problem List Items Addressed This Visit       Unprioritized   Paroxysmal atrial fibrillation (HCC)   Relevant Orders   Lipid panel   CBC with Differential/Platelet   Comprehensive metabolic  panel with GFR   Hemoglobin A1c   Microalbumin / creatinine urine ratio   Uncontrolled type 2 diabetes mellitus with hyperglycemia (HCC) - Primary   hgba1c to be checked, minimize simple carbs. Increase exercise as tolerated. Continue current meds       Relevant Orders   Lipid panel   CBC with Differential/Platelet   Comprehensive metabolic panel with GFR   Hemoglobin A1c   Microalbumin / creatinine urine ratio   Primary hypertension   Well controlled, no changes to meds. Encouraged heart healthy diet such as the DASH diet and exercise as tolerated.        Relevant Orders   Lipid panel   CBC with Differential/Platelet   Comprehensive metabolic panel with GFR   Hemoglobin A1c   Microalbumin / creatinine urine ratio   Hyperlipidemia associated with type 2 diabetes mellitus (HCC)   Encourage heart healthy diet such  as MIND or DASH diet, increase exercise, avoid trans fats, simple carbohydrates and processed foods, consider a krill or fish or flaxseed oil cap daily.        Relevant Orders   Lipid panel   CBC with Differential/Platelet   Comprehensive metabolic panel with GFR   Hemoglobin A1c   Microalbumin / creatinine urine ratio   Acute swimmer's ear of right side   Floxin  otic  Return to office as needed       Relevant Medications   ofloxacin  (FLOXIN ) 0.3 % OTIC solution  Assessment and Plan Assessment & Plan Cerumen impaction   Persistent cerumen impaction in the left ear remains despite previous Debrox and irrigation attempts, causing hearing difficulty and indicating significant blockage. Earplug use while swimming may contribute to the impaction. Attempt cerumen removal from the left ear in the office and advise against using earplugs while swimming if she contributes to wax impaction.  Rotator cuff tendinopathy   Gradual onset of shoulder pain likely involves the supraspinatus muscle. She is undergoing physical therapy exercises to address the issue. Continue  physical therapy for shoulder pain management.  Obesity   She has lost approximately 60 pounds from her highest weight with Ozempic , which she tolerates well. She reports no nausea or vomiting but notes increased bowel movement frequency, atypical as most patients report constipation with Ozempic . Continue current dose of Ozempic  1 mg.  General Health Maintenance   She is due for a mammogram in September or October and is overdue for a bone density scan, last done in December 2019. Schedule mammogram in September or October and arrange for a bone density scan.    Return in about 6 months (around 11/08/2024), or if symptoms worsen or fail to improve, for annual exam, fasting.    Nazier Neyhart R Lowne Chase, DO

## 2024-05-08 NOTE — Assessment & Plan Note (Signed)
 hgba1c to be checked, minimize simple carbs. Increase exercise as tolerated. Continue current meds

## 2024-05-08 NOTE — Assessment & Plan Note (Signed)
Floxin otic  Return to office as needed

## 2024-05-09 LAB — COMPREHENSIVE METABOLIC PANEL WITH GFR
ALT: 13 U/L (ref 0–35)
AST: 12 U/L (ref 0–37)
Albumin: 4.4 g/dL (ref 3.5–5.2)
Alkaline Phosphatase: 49 U/L (ref 39–117)
BUN: 18 mg/dL (ref 6–23)
CO2: 27 meq/L (ref 19–32)
Calcium: 9.4 mg/dL (ref 8.4–10.5)
Chloride: 101 meq/L (ref 96–112)
Creatinine, Ser: 0.69 mg/dL (ref 0.40–1.20)
GFR: 92.04 mL/min (ref >60.00)
Glucose, Bld: 114 mg/dL — ABNORMAL HIGH (ref 70–99)
Potassium: 3.7 meq/L (ref 3.5–5.1)
Sodium: 140 meq/L (ref 135–145)
Total Bilirubin: 1 mg/dL (ref 0.2–1.2)
Total Protein: 6.9 g/dL (ref 6.0–8.3)

## 2024-05-09 LAB — LIPID PANEL
Cholesterol: 152 mg/dL (ref 0–200)
HDL: 51.8 mg/dL (ref >39.00)
LDL Cholesterol: 68 mg/dL (ref 0–99)
NonHDL: 100.06
Total CHOL/HDL Ratio: 3
Triglycerides: 158 mg/dL — ABNORMAL HIGH (ref 0.0–149.0)
VLDL: 31.6 mg/dL (ref 0.0–40.0)

## 2024-05-09 LAB — CBC WITH DIFFERENTIAL/PLATELET
Basophils Absolute: 0.1 K/uL (ref 0.0–0.1)
Basophils Relative: 1 % (ref 0.0–3.0)
Eosinophils Absolute: 0.1 K/uL (ref 0.0–0.7)
Eosinophils Relative: 1.4 % (ref 0.0–5.0)
HCT: 38.8 % (ref 36.0–46.0)
Hemoglobin: 13 g/dL (ref 12.0–15.0)
Lymphocytes Relative: 26 % (ref 12.0–46.0)
Lymphs Abs: 1.9 K/uL (ref 0.7–4.0)
MCHC: 33.6 g/dL (ref 30.0–36.0)
MCV: 97.6 fl (ref 78.0–100.0)
Monocytes Absolute: 0.5 K/uL (ref 0.1–1.0)
Monocytes Relative: 7 % (ref 3.0–12.0)
Neutro Abs: 4.7 K/uL (ref 1.4–7.7)
Neutrophils Relative %: 64.6 % (ref 43.0–77.0)
Platelets: 194 K/uL (ref 150.0–400.0)
RBC: 3.97 Mil/uL (ref 3.87–5.11)
RDW: 13.4 % (ref 11.5–15.5)
WBC: 7.2 K/uL (ref 4.0–10.5)

## 2024-05-09 LAB — HEMOGLOBIN A1C: Hgb A1c MFr Bld: 5.4 % (ref 4.6–6.5)

## 2024-05-09 LAB — MICROALBUMIN / CREATININE URINE RATIO
Creatinine,U: 49.9 mg/dL
Microalb Creat Ratio: UNDETERMINED mg/g (ref 0.0–30.0)
Microalb, Ur: <0.7 mg/dL

## 2024-05-11 ENCOUNTER — Ambulatory Visit: Payer: Self-pay | Admitting: Family Medicine

## 2024-05-15 ENCOUNTER — Encounter: Payer: Self-pay | Admitting: Family Medicine

## 2024-05-15 DIAGNOSIS — M25512 Pain in left shoulder: Secondary | ICD-10-CM

## 2024-05-19 ENCOUNTER — Telehealth: Payer: Self-pay | Admitting: Neurology

## 2024-05-19 NOTE — Telephone Encounter (Signed)
 R/s appointment due to a conflict w/ another appointment

## 2024-05-22 ENCOUNTER — Ambulatory Visit: Admitting: General Practice

## 2024-05-23 ENCOUNTER — Ambulatory Visit (HOSPITAL_BASED_OUTPATIENT_CLINIC_OR_DEPARTMENT_OTHER)
Admission: RE | Admit: 2024-05-23 | Discharge: 2024-05-23 | Disposition: A | Discharge status: Home / Self Care | Source: Ambulatory Visit | Attending: Family Medicine | Admitting: Family Medicine

## 2024-05-23 DIAGNOSIS — M25512 Pain in left shoulder: Secondary | ICD-10-CM | POA: Insufficient documentation

## 2024-05-24 ENCOUNTER — Other Ambulatory Visit: Payer: Self-pay | Admitting: Family Medicine

## 2024-05-25 ENCOUNTER — Ambulatory Visit: Payer: Self-pay | Admitting: Family Medicine

## 2024-05-28 ENCOUNTER — Other Ambulatory Visit: Payer: Self-pay | Admitting: Family Medicine

## 2024-05-28 DIAGNOSIS — I10 Essential (primary) hypertension: Secondary | ICD-10-CM

## 2024-05-28 MED ORDER — AMLODIPINE BESYLATE 2.5 MG PO TABS: 2.5000 mg | ORAL_TABLET | Freq: Every day | ORAL | 1 refills | Status: AC

## 2024-05-28 NOTE — Telephone Encounter (Signed)
 Copied from CRM [id number]. Topic: Clinical - Medication Refill >> May 28, 2024 11:40 AM [place] J wrote: Medication: amLODipine  (NORVASC ) 2.5 MG tablet  Has the patient contacted their pharmacy? Yes (Agent: If no, request that the patient contact the pharmacy for the refill. If patient does not wish to contact the pharmacy document the reason why and proceed with request.) (Agent: If yes, when and what did the pharmacy advise?) Pharmacy calling for refills.  This is the patient's preferred pharmacy:  [id number] [place], [place] - [address] AT [facility] [address] [place] [address] Phone: [phone] Fax: [phone]  Is this the correct pharmacy for this prescription? Yes If no, delete pharmacy and type the correct one.   Has the prescription been filled recently? No  Is the patient out of the medication? Yes  Has the patient been seen for an appointment in the last year OR does the patient have an upcoming appointment? Yes  Can we respond through MyChart? No  Agent: Please be advised that Rx refills may take up to 3 business days. We ask that you follow-up with your pharmacy.

## 2024-06-18 ENCOUNTER — Other Ambulatory Visit: Payer: Self-pay | Admitting: Family Medicine

## 2024-06-18 DIAGNOSIS — B354 Tinea corporis: Secondary | ICD-10-CM

## 2024-06-18 DIAGNOSIS — E785 Hyperlipidemia, unspecified: Secondary | ICD-10-CM

## 2024-06-18 MED ORDER — LOVASTATIN 40 MG PO TABS: 40.0000 mg | ORAL_TABLET | Freq: Every day | ORAL | 1 refills | Status: AC

## 2024-06-18 NOTE — Telephone Encounter (Unsigned)
 Copied from CRM [id number]. Topic: Clinical - Medication Refill >> Jun 18, 2024  2:26 PM Franky GRADE wrote: Medication: lovastatin  (MEVACOR ) 40 MG tablet [[id number]] & KLAYESTA  powder [[id number]]   Has the patient contacted their pharmacy? Yes, pharmacy calling to submit the request (Agent: If no, request that the patient contact the pharmacy for the refill. If patient does not wish to contact the pharmacy document the reason why and proceed with request.) (Agent: If yes, when and what did the pharmacy advise?)  This is the patient's preferred pharmacy:  [id number] [place] - [address] AT [facility] [address] [place] [address] Phone: [phone] Fax: [phone]  Is this the correct pharmacy for this prescription? Yes If no, delete pharmacy and type the correct one.   Has the prescription been filled recently? No  Is the patient out of the medication? Unsure   Has the patient been seen for an appointment in the last year OR does the patient have an upcoming appointment? Yes  Can we respond through MyChart? Yes  Agent: Please be advised that Rx refills may take up to 3 business days. We ask that you follow-up with your pharmacy.

## 2024-06-19 MED ORDER — NYSTATIN 100000 UNIT/GM EX POWD
Freq: Three times a day (TID) | CUTANEOUS | 5 refills | Status: AC
Start: 2024-06-19 — End: 2024-07-03

## 2024-06-28 ENCOUNTER — Other Ambulatory Visit: Payer: Self-pay | Admitting: Family Medicine

## 2024-06-28 DIAGNOSIS — R32 Unspecified urinary incontinence: Secondary | ICD-10-CM

## 2024-07-11 ENCOUNTER — Encounter: Payer: Self-pay | Admitting: Family Medicine

## 2024-07-11 DIAGNOSIS — F411 Generalized anxiety disorder: Secondary | ICD-10-CM

## 2024-07-11 MED ORDER — LORAZEPAM 0.5 MG PO TABS: ORAL_TABLET | ORAL | 1 refills | Status: AC

## 2024-07-11 NOTE — Telephone Encounter (Signed)
 Requesting: lorazepam  0.5mg   Contract: 06/10/2019 UDS:06/10/2019 Last Visit: 05/08/24 Next Visit:11/10/24 Last Refill: 04/13/23 #90 and 1RF   Please Advise

## 2024-07-16 ENCOUNTER — Ambulatory Visit: Admitting: General Practice

## 2024-07-16 ENCOUNTER — Telehealth: Payer: BC Managed Care – PPO | Admitting: Neurology

## 2024-07-30 ENCOUNTER — Other Ambulatory Visit (HOSPITAL_BASED_OUTPATIENT_CLINIC_OR_DEPARTMENT_OTHER): Payer: Self-pay | Admitting: Family Medicine

## 2024-07-30 DIAGNOSIS — Z1231 Encounter for screening mammogram for malignant neoplasm of breast: Secondary | ICD-10-CM

## 2024-08-02 ENCOUNTER — Other Ambulatory Visit: Payer: Self-pay | Admitting: Family Medicine

## 2024-08-02 DIAGNOSIS — R609 Edema, unspecified: Secondary | ICD-10-CM

## 2024-08-05 ENCOUNTER — Encounter (HOSPITAL_BASED_OUTPATIENT_CLINIC_OR_DEPARTMENT_OTHER): Payer: Self-pay

## 2024-08-05 ENCOUNTER — Ambulatory Visit (HOSPITAL_BASED_OUTPATIENT_CLINIC_OR_DEPARTMENT_OTHER)
Admission: RE | Admit: 2024-08-05 | Discharge: 2024-08-05 | Disposition: A | Discharge status: Home / Self Care | Source: Ambulatory Visit | Attending: Family Medicine | Admitting: Family Medicine

## 2024-08-05 DIAGNOSIS — Z1231 Encounter for screening mammogram for malignant neoplasm of breast: Secondary | ICD-10-CM | POA: Diagnosis present

## 2024-08-11 ENCOUNTER — Encounter: Payer: Self-pay | Admitting: Family Medicine

## 2024-08-13 ENCOUNTER — Other Ambulatory Visit: Payer: Self-pay | Admitting: Family Medicine

## 2024-08-13 MED ORDER — SCOPOLAMINE 1 MG/3DAYS TD PT72: 1.0000 | MEDICATED_PATCH | TRANSDERMAL | 12 refills | Status: AC

## 2024-08-13 NOTE — Progress Notes (Unsigned)
 Cardiology Clinic Note   Patient Name: Pam Phillips Date of Encounter: 08/15/2024  Primary Care Provider:  Antonio Meth, Jamee SAUNDERS, DO Primary Cardiologist:  Stanly DELENA Leavens, MD  Patient Profile    Pam Phillips 63 year old female presents to the clinic today for follow-up evaluation of her paroxysmal atrial fibrillation.  Past Medical History    Past Medical History:  Diagnosis Date   Ankle swelling 07/04/2012   mostly left ankle -wears compression hose occ.   Anxiety    Atrial fibrillation (HCC)    Cancer (HCC) 07/04/2012   dx. Melanoma in situ -back(mid-scapula area)   Cataract    Heart murmur    Hyperlipidemia    Hypertension    Sleep apnea    Past Surgical History:  Procedure Laterality Date   CHOLECYSTECTOMY  07-04-12   '03-Lap. due to gallstones-[place]    COLONOSCOPY N/A 08/18/2013   Procedure: COLONOSCOPY;  Surgeon: Gordy CHRISTELLA Starch, MD;  Location: [facility];  Service: Gastroenterology;  Laterality: N/A;   COLONOSCOPY N/A 03/17/2024   Procedure: COLONOSCOPY;  Surgeon: Starch Gordy CHRISTELLA, MD;  Location: [facility];  Service: Gastroenterology;  Laterality: N/A;   DENTAL SURGERY  07-04-12   2 wisdom teeth and 2 other teeth   MELANOMA EXCISION  07/10/2012   Procedure: MELANOMA EXCISION;  Surgeon: Jina Nephew, MD;  Location: [facility];  Service: General;  Laterality: N/A;  excision of back melanoma insitu   POLYPECTOMY  03/17/2024   Procedure: POLYPECTOMY, INTESTINE;  Surgeon: Starch Gordy CHRISTELLA, MD;  Location: [facility];  Service: Gastroenterology;;    Allergies  Allergies  Allergen Reactions   Other Swelling    Peaches(fresh) causes throat itching-swelling   Codeine     Nausea & vomiting   Penicillins     angioedema    History of Present Illness    Pam Phillips has a PMH of hyperlipidemia, hypertension, OSA, obesity, paroxysmal atrial fibrillation, and has a CHA2DS2-VASc score of 4 (female, age, hypertension, diabetes)   She  was seen in follow-up by Dr. Alveta on 02/28/2024.  She noted occasional rare palpitations.  She felt these were evident during episodes of anxiety.  She reported compliance with her CPAP.  She was drinking 1 glass of wine each night.  She had lost around 40 pounds.  She was occasionally swimming and had joined the [employer].  She denied chest pain and dyspnea.  She continued to work for the [employer].  She was also working for a firefighter.  Her weight was noted to be 334 pounds.  She presents to the clinic today for follow-up evaluation and states her husband recently passed away.  She has been grieving.  She is helping care for her mother-in-law.  We reviewed her previous visit from May with Dr. Alveta.  She expressed understanding.  Initially her blood pressure is 149/82 and on recheck is 126/82.  She has not been monitoring this at home.  She continues to lose weight.  She notes that she has now lost around 65 pounds.  She is somewhat physically active and plans to be more physically active.  She has been swimming.  She enjoys this.  I will have her continue her current medication regimen and plan follow-up in 9 to 12 months.  She notes that she is planning to take a cruise to the [place].  She also notes that her mother-in-law sees Dr. Leavens.  She would like to establish care with him as her new primary  cardiologist.  To date she denies chest pain, shortness of breath, lower extremity edema, fatigue, palpitations, melena, hematuria, hemoptysis, diaphoresis, weakness, presyncope, syncope, orthopnea, and PND.     Home Medications    Prior to Admission medications   Medication Sig Start Date End Date Taking? Authorizing Provider  ACCU-CHEK GUIDE TEST test strip USE FOUR TIMES DAILY AS DIRECTED 04/21/24   Antonio Meth, Yvonne R, DO  acetic acid  2 % otic solution Place 4 drops into the left ear 4 (four) times daily. 04/12/24   Fleming, Zelda W, NP  amLODipine  (NORVASC ) 2.5 MG  tablet Take 1 tablet (2.5 mg total) by mouth daily. 05/28/24 08/26/24  Antonio Meth Rockers R, DO  blood glucose meter kit and supplies KIT Dispense based on patient and insurance preference. Use up to four times daily as directed. (FOR ICD-9 250.00, 250.01). 05/23/22   Antonio Meth, Rockers SAUNDERS, DO  ELIQUIS  5 MG TABS tablet TAKE 1 TABLET(5 MG) BY MOUTH TWICE DAILY 03/11/24   Fenton, Clint R, PA  fish oil-omega-3 fatty acids  1000 MG capsule Take 1 g by mouth 2 (two) times daily.  07/10/11   Lowne Chase, Yvonne R, DO  Flaxseed, Linseed, (FLAX SEED OIL) 1000 MG CAPS Take 1,000 mg by mouth in the morning and at bedtime.    [provider]  FLUoxetine  (PROZAC ) 40 MG capsule TAKE 1 CAPSULE(40 MG) BY MOUTH DAILY 03/17/24   Antonio Meth, Yvonne R, DO  furosemide  (LASIX ) 20 MG tablet Take 1 tablet (20 mg total) by mouth daily. 08/04/24   Lowne Chase, Yvonne R, DO  GLUCOSAMINE-CHONDROITIN PO Take 1 capsule by mouth 2 (two) times daily.    [provider]  LORazepam  (ATIVAN ) 0.5 MG tablet TAKE 1 TABLET(0.5 MG) BY MOUTH EVERY 8 HOURS AS NEEDED FOR ANXIETY 07/11/24   Antonio Meth, Rockers SAUNDERS, DO  lovastatin  (MEVACOR ) 40 MG tablet Take 1 tablet (40 mg total) by mouth at bedtime. 06/18/24   Antonio Meth Rockers R, DO  metFORMIN  (GLUCOPHAGE ) 500 MG tablet TAKE 1 TABLET(500 MG) BY MOUTH TWICE DAILY WITH A MEAL 05/26/24   Antonio Meth, Yvonne R, DO  metoprolol  tartrate (LOPRESSOR ) 25 MG tablet Take 0.5 tablets (12.5 mg total) by mouth every 6 (six) hours as needed (heart racing). 02/13/24 02/12/25  Fenton, Clint R, PA  Multiple Vitamin (MULTIVITAMIN) capsule Take 1 capsule by mouth daily.    [provider]  ofloxacin  (FLOXIN ) 0.3 % OTIC solution Place 10 drops into the right ear daily. 05/08/24   Lowne Chase, Yvonne R, DO  olmesartan -hydrochlorothiazide  (BENICAR  HCT) 40-25 MG tablet TAKE 1 TABLET BY MOUTH DAILY 03/11/24   Antonio Meth, Yvonne R, DO  polyvinyl alcohol (LIQUIFILM TEARS) 1.4 % ophthalmic solution Place  1 drop into both eyes daily as needed (For dry eyes.).    [provider]  scopolamine (TRANSDERM-SCOP) 1 MG/3DAYS Place 1 patch (1 mg total) onto the skin every 3 (three) days. 08/13/24   Antonio Meth Rockers R, DO  Semaglutide , 1 MG/DOSE, (OZEMPIC , 1 MG/DOSE,) 4 MG/3ML SOPN Inject 1 mg into the skin once a week. 03/27/24   Antonio Meth Rockers SAUNDERS, DO  tolterodine  (DETROL ) 2 MG tablet Take 1 tablet (2 mg total) by mouth 2 (two) times daily. 06/30/24   Antonio Meth Rockers SAUNDERS, DO    Family History    Family History  Problem Relation Age of Onset   Colon polyps Mother    Colon cancer Mother    Alzheimer's disease Mother    Hypertension  Father    COPD Father    Heart disease Father 85       MI   Cancer Father 18       prostate ca   Coronary artery disease Other 26       1st degree female   Pancreatic cancer Neg Hx    Stomach cancer Neg Hx    Sleep apnea Neg Hx    Esophageal cancer Neg Hx    Rectal cancer Neg Hx    She indicated that her mother is alive. She indicated that her father is deceased. She indicated that her brother is alive. She indicated that the status of her neg hx is unknown. She indicated that the status of her other is unknown.  Social History    Social History   Socioeconomic History   Marital status: Married    Spouse name: Not on file   Number of children: Not on file   Years of education: Not on file   Highest education level: Some college, no degree  Occupational History   Occupation: neurosurgeon    Employer: [employer]  Tobacco Use   Smoking status: Never   Smokeless tobacco: Never   Tobacco comments:    Never smoke 09/22/22  Substance and Sexual Activity   Alcohol use: Yes    Alcohol/week: 7.0 standard drinks of alcohol    Types: 7 Glasses of wine per week    Comment: 1 glass of wine nightly 09/22/22   Drug use: No   Sexual activity: Yes    Partners: Male  Other Topics Concern   Not on file  Social History Narrative    Exercise-- not right now   Social Drivers of Health   Financial Resource Strain: Low Risk  (04/24/2024)   Overall Financial Resource Strain (CARDIA)    Difficulty of Paying Living Expenses: Not very hard  Food Insecurity: No Food Insecurity (04/24/2024)   Hunger Vital Sign    Worried About Running Out of Food in the Last Year: Never true    Ran Out of Food in the Last Year: Never true  Transportation Needs: No Transportation Needs (04/24/2024)   PRAPARE - Administrator, Civil Service (Medical): No    Lack of Transportation (Non-Medical): No  Physical Activity: Insufficiently Active (04/24/2024)   Exercise Vital Sign    Days of Exercise per Week: 4 days    Minutes of Exercise per Session: 20 min  Stress: Stress Concern Present (04/24/2024)   [employer] of Occupational Health - Occupational Stress Questionnaire    Feeling of Stress: To some extent  Social Connections: Moderately Isolated (04/24/2024)   Social Connection and Isolation Panel    Frequency of Communication with Friends and Family: Twice a week    Frequency of Social Gatherings with Friends and Family: Once a week    Attends Religious Services: Never    Database Administrator or Organizations: No    Attends Engineer, Structural: Not on file    Marital Status: Married  Catering Manager Violence: Not on file     Review of Systems    General:  No chills, fever, night sweats or weight changes.  Cardiovascular:  No chest pain, dyspnea on exertion, edema, orthopnea, palpitations, paroxysmal nocturnal dyspnea. Dermatological: No rash, lesions/masses Respiratory: No cough, dyspnea Urologic: No hematuria, dysuria Abdominal:   No nausea, vomiting, diarrhea, bright red blood per rectum, melena, or hematemesis Neurologic:  No visual changes, wkns, changes in mental  status. All other systems reviewed and are otherwise negative except as noted above.  Physical Exam    VS:  BP 126/82   Pulse (!) 51    Ht 5' 7 (1.702 m)   Wt (!) 315 lb (142.9 kg)   LMP 06/26/2012   SpO2 96%   BMI 49.34 kg/m  , BMI Body mass index is 49.34 kg/m. GEN: Well nourished, well developed, in no acute distress. HEENT: normal. Neck: Supple, no JVD, carotid bruits, or masses. Cardiac: RRR, no murmurs, rubs, or gallops. No clubbing, cyanosis, edema.  Radials/DP/PT 2+ and equal bilaterally.  Respiratory:  Respirations regular and unlabored, clear to auscultation bilaterally. GI: Soft, nontender, nondistended, BS + x 4. MS: no deformity or atrophy. Skin: warm and dry, no rash. Neuro:  Strength and sensation are intact. Psych: Normal affect.  Accessory Clinical Findings    Recent Labs: 05/08/2024: ALT 13; BUN 18; Creatinine, Ser 0.69; Hemoglobin 13.0; Platelets 194.0; Potassium 3.7; Sodium 140   Recent Lipid Panel    Component Value Date/Time   CHOL 152 05/08/2024 1551   TRIG 158.0 (H) 05/08/2024 1551   HDL 51.80 05/08/2024 1551   CHOLHDL 3 05/08/2024 1551   VLDL 31.6 05/08/2024 1551   LDLCALC 68 05/08/2024 1551   LDLCALC 86 06/29/2020 1046         ECG personally reviewed by me today- None today.  Echocardiogram 11/10/2022  IMPRESSIONS     1. Left ventricular ejection fraction, by estimation, is 60 to 65%. The  left ventricle has normal function. The left ventricle has no regional  wall motion abnormalities. Left ventricular diastolic parameters were  normal. The average left ventricular  global longitudinal strain is -23.0 %. The global longitudinal strain is  normal.   2. Right ventricular systolic function is normal. The right ventricular  size is normal. Tricuspid regurgitation signal is inadequate for assessing  PA pressure.   3. No evidence of mitral valve regurgitation.   4. The aortic valve is grossly normal. Aortic valve regurgitation is not  visualized.   5. The inferior vena cava is normal in size with greater than 50%  respiratory variability, suggesting right atrial pressure  of 3 mmHg.   Conclusion(s)/Recommendation(s): Normal biventricular function without  evidence of hemodynamically significant valvular heart disease.   FINDINGS   Left Ventricle: Left ventricular ejection fraction, by estimation, is 60  to 65%. The left ventricle has normal function. The left ventricle has no  regional wall motion abnormalities. The average left ventricular global  longitudinal strain is -23.0 %.  The global longitudinal strain is normal. The left ventricular internal  cavity size was normal in size. There is no left ventricular hypertrophy.  Left ventricular diastolic parameters were normal.   Right Ventricle: The right ventricular size is normal. Right ventricular  systolic function is normal. Tricuspid regurgitation signal is inadequate  for assessing PA pressure.   Left Atrium: Left atrial size was normal in size.   Right Atrium: Right atrial size was normal in size.   Pericardium: There is no evidence of pericardial effusion.   Mitral Valve: No evidence of mitral valve regurgitation.   Tricuspid Valve: Tricuspid valve regurgitation is not demonstrated.   Aortic Valve: The aortic valve is grossly normal. Aortic valve  regurgitation is not visualized.   Pulmonic Valve: Pulmonic valve regurgitation is not visualized.   Aorta: The aortic root and ascending aorta are structurally normal, with  no evidence of dilitation.   Venous: The inferior vena cava is normal  in size with greater than 50%  respiratory variability, suggesting right atrial pressure of 3 mmHg.   IAS/Shunts: No atrial level shunt detected by color flow Doppler.      Assessment & Plan   1.  Paroxysmal atrial fibrillation-Hr 51.  Denies recent episodes of accelerated or irregular heartbeat.  Reports compliance with apixaban .  Denies bleeding issues. Continue apixaban , metoprolol , Benicar  Avoid triggers caffeine, chocolate, EtOH, dehydration excetra. Maintain physical activity/increase as  tolerated  Essential hypertension-BP today 126/82. Maintain blood pressure log Continue amlodipine , metoprolol   Hyperlipidemia-LDL 68 05/08/24. High-fiber diet Continue lovastatin , omega-3 fatty acids , flaxseed  OSA-reports compliance with CPAP.  Waking up well rested. Continue CPAP use Continue weight loss  Disposition: Follow-up with Dr. Salvador or me in 9-12 months.   Josefa HERO. Tehya Leath NP-C     08/15/2024, 1:48 PM [facility] [address] [place], [place] [address] Office [phone]    Notice: This dictation was prepared with Dragon dictation along with smaller phrase technology. Any transcriptional errors that result from this process are unintentional and may not be corrected upon review.   I spent 14 minutes examining this patient, reviewing medications, and using patient centered shared decision making involving their cardiac care.   I spent  20 minutes reviewing past medical history,  medications, and prior cardiac tests.

## 2024-08-15 ENCOUNTER — Encounter: Payer: Self-pay | Admitting: General Practice

## 2024-08-15 ENCOUNTER — Ambulatory Visit: Attending: General Practice | Admitting: General Practice

## 2024-08-15 VITALS — BP 126/82 | HR 51 | Ht 67.0 in | Wt 315.0 lb

## 2024-08-15 DIAGNOSIS — I10 Essential (primary) hypertension: Secondary | ICD-10-CM

## 2024-08-15 DIAGNOSIS — I480 Paroxysmal atrial fibrillation: Secondary | ICD-10-CM

## 2024-08-15 DIAGNOSIS — E1169 Type 2 diabetes mellitus with other specified complication: Secondary | ICD-10-CM | POA: Diagnosis not present

## 2024-08-15 DIAGNOSIS — G4733 Obstructive sleep apnea (adult) (pediatric): Secondary | ICD-10-CM

## 2024-08-15 DIAGNOSIS — E785 Hyperlipidemia, unspecified: Secondary | ICD-10-CM

## 2024-08-15 NOTE — Patient Instructions (Addendum)
 Medication Instructions:   Continue all current medications.   Labwork:  none  Testing/Procedures:  none  Follow-Up:  9-12 months   Any Other Special Instructions Will Be Listed Below (If Applicable).   If you need a refill on your cardiac medications before your next appointment, please call your pharmacy.

## 2024-08-24 ENCOUNTER — Telehealth: Admitting: Physician Assistant

## 2024-08-24 DIAGNOSIS — R3989 Other symptoms and signs involving the genitourinary system: Secondary | ICD-10-CM | POA: Diagnosis not present

## 2024-08-24 MED ORDER — CEPHALEXIN 500 MG PO CAPS: 500.0000 mg | ORAL_CAPSULE | Freq: Two times a day (BID) | ORAL | 0 refills | Status: AC

## 2024-08-24 NOTE — Progress Notes (Signed)

## 2024-09-06 ENCOUNTER — Other Ambulatory Visit: Payer: Self-pay | Admitting: Family Medicine

## 2024-09-17 ENCOUNTER — Telehealth: Admitting: Neurology

## 2024-09-17 DIAGNOSIS — G4733 Obstructive sleep apnea (adult) (pediatric): Secondary | ICD-10-CM | POA: Diagnosis not present

## 2024-09-17 NOTE — Patient Instructions (Signed)
 Great to see you today! Continue CPAP usage minimum 4 hours nightly Continue current settings Continue to replace supplies routinely through DME Follow-up in 1 year or sooner if needed. Thanks!!

## 2024-09-17 NOTE — Progress Notes (Signed)
 Patient: Pam Phillips Date of Birth: 08-Aug-1961  Reason for Visit: Follow up History from: Patient Primary Neurologist: Buck   Virtual Visit via Video Note  I connected with Pam Phillips on 09/17/24 at  4:00 PM EST by a video enabled telemedicine application and verified that I am speaking with the correct person using two identifiers.  Location: Patient: at her car Provider: in the office    I discussed the limitations of evaluation and management by telemedicine and the availability of in person appointments. The patient expressed understanding and agreed to proceed.  ASSESSMENT AND PLAN 63 y.o. year old female   1.  OSA on CPAP (setup August 2023)  -Doing great with CPAP.  Has very good compliance.  Continue nightly usage minimum 4 hours.  Continue current settings.  Continue to replace supplies routinely through DME. ONO was unremarkable.  Follow-up with me in 1 year.  HISTORY OF PRESENT ILLNESS: Today 09/17/24 09/17/24 SS: Has ONO, results did not indicate significant desaturations on CPAP.  Compliance report shows 67% usage greater than 4 hours, set pressure 11 cmH2O, AHI 0.8, leak 6.7. Missed a few days due to vacation, went on a cruise, forgot to bring her CPAP. Her husband passed in September. In general sleeping well, feels rested during the day. Uses FFM. No new health issues.   07/18/23 SS: Had ONO in December, results were felt to be inaccurate, will reorder.  I do not see this was completed. She is newly diagnosed with AFIB, on Eliquis  now, is back to regular rhythm now, on metoprolol . ESS 11. Without CPAP, she feels more tired, dragging throughout the day.  CPAP compliance 06/18/2023-07/17/23 shows usage 29/30 days at 97%, greater than 4 hours 73%.  Average usage 5 hours.  Set pressure 11 cm.  Leak 8.9.  AHI 0.7.  Uses fullface mask.  Often times to falls asleep reading or watching TV, then put CPAP on when she wakes up.  She tolerates well.  No issues with  the equipment.  Her husband's health seems to have stabilized.  Her mother passed away last year.  She is now retired.  HISTORY  Pam Phillips is a 63 year old right-handed woman with an underlying medical history of melanoma, anxiety, hypertension, hyperlipidemia, and morbid obesity with a BMI of over 50, who presents for follow-up consultation of her obstructive sleep apnea after interim testing and starting CPAP therapy at home.  The patient is unaccompanied today.  I first met her at the request of her primary care physician on 04/03/2022, at which time the patient reported snoring and excessive daytime somnolence as well as witnessed apneas.  She was encouraged to proceed with sleep testing.  She had a baseline sleep study, followed by a CPAP titration study.   She had a baseline sleep study on 04/20/22 which showed moderate to severe obstructive sleep apnea, with a total AHI of 16.3/hour, REM AHI of 43.4/hour, supine AHI of 21.7/hour and O2 nadir of 71% (during supine REM sleep).    She had a subsequent CPAP titration study on 05/05/2022, at which time she was to titrated from 7 cm to 11 cm.  She was tried on supplemental oxygen with CPAP briefly but at the end of the study it was discontinued, her baseline oxygen saturations drifted closer to 89% with a CPAP of 11 cm without supplemental oxygen, a fullface mask size small from [employer] was used for the study.  Based on her test results I prescribed home CPAP therapy  at a pressure of 11 cm without supplemental oxygen.  Her set up date was 05/22/2022.  She has a ResMed AirSense 10 AutoSet machine.   Today, 07/17/2022: I reviewed her CPAP compliance data from 06/17/2022 through 07/12/2022, which is a total of 26 days, during which time she used her machine every night with percent use days greater than 4 hours at 81%, indicating very good compliance with an average usage of 4 hours and 49 minutes, residual AHI at goal at 0.4/h, leak on the low side with the  95th percentile at 4.3 L/min on a pressure of 11 cm with EPR of 3.  She reports doing fairly well with CPAP, she feels that she has adjusted well to treatment, she is using the ResMed F30 under the nose fullface mask.  Sometimes will sleep without the mask on as she likes to read before falling asleep and she cannot put her reading glasses on with the mask on and she will fall asleep without it, then wake up in the middle of the night and put the mask on.  She does note some improvement, she feels that her daytime energy and stamina are a little bit better.  She still has tiredness, she attributes some to stress in general.  She is worried about her husband's health.  Her mother-in-law also lives with them.  Her own mom is in a nursing home.  She is working on weight loss, since starting the Ozempic  she has lost about 15 pounds.  She is working with her primary care on this.  She has just recently increased to the 0.5 mg dose.  She is very to continue with her CPAP and agreeable to pursuing a pulse oximetry test while on CPAP therapy as we discussed her oxygen desaturations during both sleep studies. Her Epworth sleepiness score is 7 out of 24, previously was 14 out of 24, significantly improved.  REVIEW OF SYSTEMS: Out of a complete 14 system review of symptoms, the patient complains only of the following symptoms, and all other reviewed systems are negative.  See HPI  ALLERGIES: Allergies  Allergen Reactions   Other Swelling    Peaches(fresh) causes throat itching-swelling   Codeine     Nausea & vomiting   Penicillins     angioedema    HOME MEDICATIONS: Outpatient Medications Prior to Visit  Medication Sig Dispense Refill   ACCU-CHEK GUIDE TEST test strip USE FOUR TIMES DAILY AS DIRECTED 100 strip 1   amLODipine  (NORVASC ) 2.5 MG tablet Take 1 tablet (2.5 mg total) by mouth daily. 90 tablet 1   blood glucose meter kit and supplies KIT Dispense based on patient and insurance preference. Use up  to four times daily as directed. (FOR ICD-9 250.00, 250.01). 1 each 0   ELIQUIS  5 MG TABS tablet TAKE 1 TABLET(5 MG) BY MOUTH TWICE DAILY 60 tablet 10   fish oil-omega-3 fatty acids  1000 MG capsule Take 1 g by mouth 2 (two) times daily.      Flaxseed, Linseed, (FLAX SEED OIL) 1000 MG CAPS Take 1,000 mg by mouth in the morning and at bedtime.     FLUoxetine  (PROZAC ) 40 MG capsule TAKE 1 CAPSULE(40 MG) BY MOUTH DAILY 90 capsule 3   furosemide  (LASIX ) 20 MG tablet Take 1 tablet (20 mg total) by mouth daily. 90 tablet 1   GLUCOSAMINE-CHONDROITIN PO Take 1 capsule by mouth 2 (two) times daily.     LORazepam  (ATIVAN ) 0.5 MG tablet TAKE 1 TABLET(0.5 MG) BY MOUTH  EVERY 8 HOURS AS NEEDED FOR ANXIETY 90 tablet 1   lovastatin  (MEVACOR ) 40 MG tablet Take 1 tablet (40 mg total) by mouth at bedtime. 90 tablet 1   metFORMIN  (GLUCOPHAGE ) 500 MG tablet TAKE 1 TABLET(500 MG) BY MOUTH TWICE DAILY WITH A MEAL 180 tablet 1   metoprolol  tartrate (LOPRESSOR ) 25 MG tablet Take 0.5 tablets (12.5 mg total) by mouth every 6 (six) hours as needed (heart racing).     Multiple Vitamin (MULTIVITAMIN) capsule Take 1 capsule by mouth daily.     olmesartan -hydrochlorothiazide  (BENICAR  HCT) 40-25 MG tablet Take 1 tablet by mouth daily. 90 tablet 1   polyvinyl alcohol (LIQUIFILM TEARS) 1.4 % ophthalmic solution Place 1 drop into both eyes daily as needed (For dry eyes.).     scopolamine  (TRANSDERM-SCOP) 1 MG/3DAYS Place 1 patch (1 mg total) onto the skin every 3 (three) days. 10 patch 12   Semaglutide , 1 MG/DOSE, (OZEMPIC , 1 MG/DOSE,) 4 MG/3ML SOPN Inject 1 mg into the skin once a week. 9 mL 1   tolterodine  (DETROL ) 2 MG tablet Take 1 tablet (2 mg total) by mouth 2 (two) times daily. 180 tablet 1   No facility-administered medications prior to visit.    PAST MEDICAL HISTORY: Past Medical History:  Diagnosis Date   Ankle swelling 07/04/2012   mostly left ankle -wears compression hose occ.   Anxiety    Atrial fibrillation  (HCC)    Cancer (HCC) 07/04/2012   dx. Melanoma in situ -back(mid-scapula area)   Cataract    Heart murmur    Hyperlipidemia    Hypertension    Sleep apnea     PAST SURGICAL HISTORY: Past Surgical History:  Procedure Laterality Date   CHOLECYSTECTOMY  07-04-12   '03-Lap. due to gallstones-[place]    COLONOSCOPY N/A 08/18/2013   Procedure: COLONOSCOPY;  Surgeon: Gordy CHRISTELLA Starch, MD;  Location: [facility];  Service: Gastroenterology;  Laterality: N/A;   COLONOSCOPY N/A 03/17/2024   Procedure: COLONOSCOPY;  Surgeon: Starch Gordy CHRISTELLA, MD;  Location: [facility];  Service: Gastroenterology;  Laterality: N/A;   DENTAL SURGERY  07-04-12   2 wisdom teeth and 2 other teeth   MELANOMA EXCISION  07/10/2012   Procedure: MELANOMA EXCISION;  Surgeon: Jina Nephew, MD;  Location: [facility];  Service: General;  Laterality: N/A;  excision of back melanoma insitu   POLYPECTOMY  03/17/2024   Procedure: POLYPECTOMY, INTESTINE;  Surgeon: Starch Gordy CHRISTELLA, MD;  Location: [facility];  Service: Gastroenterology;;    FAMILY HISTORY: Family History  Problem Relation Age of Onset   Colon polyps Mother    Colon cancer Mother    Alzheimer's disease Mother    Hypertension Father    COPD Father    Heart disease Father 43       MI   Cancer Father 51       prostate ca   Coronary artery disease Other 83       1st degree female   Pancreatic cancer Neg Hx    Stomach cancer Neg Hx    Sleep apnea Neg Hx    Esophageal cancer Neg Hx    Rectal cancer Neg Hx     SOCIAL HISTORY: Social History   Socioeconomic History   Marital status: Married    Spouse name: Not on file   Number of children: Not on file   Years of education: Not on file   Highest education level: Some college, no degree  Occupational History   Occupation: neurosurgeon  Employer: [employer]  Tobacco Use   Smoking status: Never   Smokeless tobacco: Never   Tobacco comments:    Never smoke 09/22/22  Substance and Sexual Activity    Alcohol use: Yes    Alcohol/week: 7.0 standard drinks of alcohol    Types: 7 Glasses of wine per week    Comment: 1 glass of wine nightly 09/22/22   Drug use: No   Sexual activity: Yes    Partners: Male  Other Topics Concern   Not on file  Social History Narrative   Exercise-- not right now   Social Drivers of Health   Financial Resource Strain: Low Risk  (04/24/2024)   Overall Financial Resource Strain (CARDIA)    Difficulty of Paying Living Expenses: Not very hard  Food Insecurity: No Food Insecurity (04/24/2024)   Hunger Vital Sign    Worried About Running Out of Food in the Last Year: Never true    Ran Out of Food in the Last Year: Never true  Transportation Needs: No Transportation Needs (04/24/2024)   PRAPARE - Administrator, Civil Service (Medical): No    Lack of Transportation (Non-Medical): No  Physical Activity: Insufficiently Active (04/24/2024)   Exercise Vital Sign    Days of Exercise per Week: 4 days    Minutes of Exercise per Session: 20 min  Stress: Stress Concern Present (04/24/2024)   [employer] of Occupational Health - Occupational Stress Questionnaire    Feeling of Stress: To some extent  Social Connections: Moderately Isolated (04/24/2024)   Social Connection and Isolation Panel    Frequency of Communication with Friends and Family: Twice a week    Frequency of Social Gatherings with Friends and Family: Once a week    Attends Religious Services: Never    Database Administrator or Organizations: No    Attends Engineer, Structural: Not on file    Marital Status: Married  Catering Manager Violence: Not on file   PHYSICAL EXAM  There were no vitals filed for this visit.  There is no height or weight on file to calculate BMI.  Virtual Visit   DIAGNOSTIC DATA (LABS, IMAGING, TESTING) - I reviewed patient records, labs, notes, testing and imaging myself where available.  Lab Results  Component Value Date   WBC 7.2  05/08/2024   HGB 13.0 05/08/2024   HCT 38.8 05/08/2024   MCV 97.6 05/08/2024   PLT 194.0 05/08/2024      Component Value Date/Time   NA 140 05/08/2024 1551   K 3.7 05/08/2024 1551   CL 101 05/08/2024 1551   CO2 27 05/08/2024 1551   GLUCOSE 114 (H) 05/08/2024 1551   GLUCOSE 128 (H) 08/03/2006 0932   BUN 18 05/08/2024 1551   CREATININE 0.69 05/08/2024 1551   CREATININE 0.63 06/29/2020 1046   CALCIUM 9.4 05/08/2024 1551   PROT 6.9 05/08/2024 1551   ALBUMIN 4.4 05/08/2024 1551   AST 12 05/08/2024 1551   ALT 13 05/08/2024 1551   ALKPHOS 49 05/08/2024 1551   BILITOT 1.0 05/08/2024 1551   GFRNONAA >60 11/10/2022 0912   GFRAA >90 07/04/2012 1015   Lab Results  Component Value Date   CHOL 152 05/08/2024   HDL 51.80 05/08/2024   LDLCALC 68 05/08/2024   TRIG 158.0 (H) 05/08/2024   CHOLHDL 3 05/08/2024   Lab Results  Component Value Date   HGBA1C 5.4 05/08/2024   Lab Results  Component Value Date   VITAMINB12 597 01/11/2024  Lab Results  Component Value Date   TSH 2.072 09/22/2022   Lauraine Born, AGNP-C, DNP 09/17/2024, 4:07 PM [facility] [address] [place], [place] [address] [phone]

## 2024-09-21 ENCOUNTER — Other Ambulatory Visit: Payer: Self-pay | Admitting: Family Medicine

## 2024-09-21 DIAGNOSIS — E1165 Type 2 diabetes mellitus with hyperglycemia: Secondary | ICD-10-CM

## 2024-10-14 ENCOUNTER — Telehealth: Admitting: Family Medicine

## 2024-10-14 DIAGNOSIS — J069 Acute upper respiratory infection, unspecified: Secondary | ICD-10-CM | POA: Diagnosis not present

## 2024-10-14 MED ORDER — FLUTICASONE PROPIONATE 50 MCG/ACT NA SUSP: 2.0000 | Freq: Every day | NASAL | 6 refills | Status: AC

## 2024-10-14 MED ORDER — PROMETHAZINE-DM 6.25-15 MG/5ML PO SYRP: 5.0000 mL | ORAL_SOLUTION | Freq: Four times a day (QID) | ORAL | 0 refills | Status: AC | PRN

## 2024-10-14 NOTE — Progress Notes (Signed)

## 2024-10-25 ENCOUNTER — Telehealth: Admitting: Nurse Practitioner

## 2024-10-25 DIAGNOSIS — L309 Dermatitis, unspecified: Secondary | ICD-10-CM | POA: Diagnosis not present

## 2024-10-25 MED ORDER — TRIAMCINOLONE ACETONIDE 0.1 % EX CREA: 1.0000 | TOPICAL_CREAM | Freq: Two times a day (BID) | CUTANEOUS | 0 refills | Status: AC

## 2024-10-25 NOTE — Progress Notes (Signed)
 E Visit for Rash  We are sorry that you are not feeling well. Here is how we plan to help!  Based on what you shared with me it looks like you have dermatitis.  Contact dermatitis is a skin rash caused by something that touches the skin and causes irritation or inflammation.  Your skin may be red and itch.  The rash should go away in a few days but can last a few weeks.  If you get a rash, it's important to figure out what caused it so the irritant can be avoided in the future. and I am prescribing triamcinolone  0.1 % cream -- apply to the affected area(s) in a thin layer, twice daily for up to 14 days. Do not apply to face, privates or armpit regions.     HOME CAReE  Take cool showers and avoid direct sunlight. Apply cool compress or wet dressings. Take a bath in an oatmeal bath.  Sprinkle content of one Aveeno packet under running faucet with comfortably warm water.  Bathe for 15-20 minutes, 1-2 times daily.  Pat dry with a towel. Do not rub the rash. Use hydrocortisone cream. Take an antihistamine like Benadryl for widespread rashes that itch.  The adult dose of Benadryl is 25-50 mg by mouth 4 times daily. Caution:  This type of medication may cause sleepiness.  Do not drink alcohol, drive, or operate dangerous machinery while taking antihistamines.  Do not take these medications if you have prostate enlargement.  Read package instructions thoroughly on all medications that you take.  GET HELP RIGHT AWAY IF:  Symptoms don't go away after treatment. Severe itching that persists. If you rash spreads or swells. If you rash begins to smell. If it blisters and opens or develops a yellow-brown crust. You develop a fever. You have a sore throat. You become short of breath.  MAKE SURE YOU:  Understand these instructions. Will watch your condition. Will get help right away if you are not doing well or get worse.  Thank you for choosing an e-visit. Your e-visit answers were reviewed by a  board certified advanced clinical practitioner to complete your personal care plan. Depending upon the condition, your plan could have included both over the counter or prescription medications. Please review your pharmacy choice. Be sure that the pharmacy you have chosen is open so that you can pick up your prescription now.  If there is a problem you may message your provider in MyChart to have the prescription routed to another pharmacy. Your safety is important to us . If you have drug allergies check your prescription carefully.  For the next 24 hours, you can use MyChart to ask questions about todays visit, request a non-urgent call back, or ask for a work or school excuse from your e-visit provider. You will get an email in the next two days asking about your experience. I hope that your e-visit has been valuable and will speed your recovery.  I have spent 5 minutes in review of e-visit questionnaire, review and updating patient chart, medical decision making and response to patient.   Emmary Culbreath W Early Ord, NP

## 2024-11-10 ENCOUNTER — Ambulatory Visit: Admitting: Family Medicine

## 2024-12-01 ENCOUNTER — Ambulatory Visit: Admitting: Family Medicine

## 2025-02-12 ENCOUNTER — Ambulatory Visit (HOSPITAL_COMMUNITY): Admitting: Physician Assistant

## 2025-09-07 ENCOUNTER — Encounter (INDEPENDENT_AMBULATORY_CARE_PROVIDER_SITE_OTHER): Payer: BC Managed Care – PPO | Admitting: Ophthalmology

## 2025-09-24 ENCOUNTER — Ambulatory Visit: Admitting: Neurology
